# Patient Record
Sex: Male | Born: 1960 | Race: White | Hispanic: No | Marital: Married | Smoking: Former smoker
Health system: Southern US, Community
[De-identification: ages and names within clinical notes are randomized; demographics above are authoritative.]

## PROBLEM LIST (undated history)

## (undated) DIAGNOSIS — E785 Hyperlipidemia, unspecified: Secondary | ICD-10-CM

---

## 2018-04-20 ENCOUNTER — Inpatient Hospital Stay (HOSPITAL_BASED_OUTPATIENT_CLINIC_OR_DEPARTMENT_OTHER)
Admission: EM | Admit: 2018-04-20 | Discharge: 2018-04-28 | DRG: 234 | Disposition: A | Discharge status: Home / Self Care | Payer: 59 | Attending: Cardiothoracic Surgery | Admitting: Cardiothoracic Surgery

## 2018-04-20 ENCOUNTER — Emergency Department (HOSPITAL_BASED_OUTPATIENT_CLINIC_OR_DEPARTMENT_OTHER): Payer: 59

## 2018-04-20 ENCOUNTER — Other Ambulatory Visit: Payer: Self-pay

## 2018-04-20 ENCOUNTER — Encounter (HOSPITAL_BASED_OUTPATIENT_CLINIC_OR_DEPARTMENT_OTHER): Payer: Self-pay

## 2018-04-20 DIAGNOSIS — I219 Acute myocardial infarction, unspecified: Secondary | ICD-10-CM | POA: Diagnosis not present

## 2018-04-20 DIAGNOSIS — Z716 Tobacco abuse counseling: Secondary | ICD-10-CM

## 2018-04-20 DIAGNOSIS — Z79899 Other long term (current) drug therapy: Secondary | ICD-10-CM

## 2018-04-20 DIAGNOSIS — I2510 Atherosclerotic heart disease of native coronary artery without angina pectoris: Secondary | ICD-10-CM | POA: Diagnosis not present

## 2018-04-20 DIAGNOSIS — I361 Nonrheumatic tricuspid (valve) insufficiency: Secondary | ICD-10-CM | POA: Diagnosis not present

## 2018-04-20 DIAGNOSIS — J9811 Atelectasis: Secondary | ICD-10-CM | POA: Diagnosis not present

## 2018-04-20 DIAGNOSIS — Z6829 Body mass index (BMI) 29.0-29.9, adult: Secondary | ICD-10-CM

## 2018-04-20 DIAGNOSIS — I083 Combined rheumatic disorders of mitral, aortic and tricuspid valves: Secondary | ICD-10-CM | POA: Diagnosis not present

## 2018-04-20 DIAGNOSIS — E669 Obesity, unspecified: Secondary | ICD-10-CM | POA: Diagnosis present

## 2018-04-20 DIAGNOSIS — E8770 Fluid overload, unspecified: Secondary | ICD-10-CM | POA: Diagnosis not present

## 2018-04-20 DIAGNOSIS — D62 Acute posthemorrhagic anemia: Secondary | ICD-10-CM | POA: Diagnosis not present

## 2018-04-20 DIAGNOSIS — R7303 Prediabetes: Secondary | ICD-10-CM | POA: Diagnosis present

## 2018-04-20 DIAGNOSIS — I252 Old myocardial infarction: Secondary | ICD-10-CM | POA: Diagnosis not present

## 2018-04-20 DIAGNOSIS — F17210 Nicotine dependence, cigarettes, uncomplicated: Secondary | ICD-10-CM | POA: Diagnosis present

## 2018-04-20 DIAGNOSIS — J986 Disorders of diaphragm: Secondary | ICD-10-CM | POA: Diagnosis not present

## 2018-04-20 DIAGNOSIS — F419 Anxiety disorder, unspecified: Secondary | ICD-10-CM | POA: Diagnosis present

## 2018-04-20 DIAGNOSIS — R072 Precordial pain: Secondary | ICD-10-CM | POA: Diagnosis not present

## 2018-04-20 DIAGNOSIS — Z01810 Encounter for preprocedural cardiovascular examination: Secondary | ICD-10-CM | POA: Diagnosis not present

## 2018-04-20 DIAGNOSIS — J90 Pleural effusion, not elsewhere classified: Secondary | ICD-10-CM | POA: Diagnosis not present

## 2018-04-20 DIAGNOSIS — Z8249 Family history of ischemic heart disease and other diseases of the circulatory system: Secondary | ICD-10-CM | POA: Diagnosis not present

## 2018-04-20 DIAGNOSIS — I081 Rheumatic disorders of both mitral and tricuspid valves: Secondary | ICD-10-CM | POA: Diagnosis present

## 2018-04-20 DIAGNOSIS — I25110 Atherosclerotic heart disease of native coronary artery with unstable angina pectoris: Secondary | ICD-10-CM | POA: Diagnosis not present

## 2018-04-20 DIAGNOSIS — I4891 Unspecified atrial fibrillation: Secondary | ICD-10-CM | POA: Diagnosis not present

## 2018-04-20 DIAGNOSIS — R079 Chest pain, unspecified: Secondary | ICD-10-CM | POA: Diagnosis not present

## 2018-04-20 DIAGNOSIS — I249 Acute ischemic heart disease, unspecified: Secondary | ICD-10-CM | POA: Diagnosis present

## 2018-04-20 DIAGNOSIS — Z09 Encounter for follow-up examination after completed treatment for conditions other than malignant neoplasm: Secondary | ICD-10-CM

## 2018-04-20 DIAGNOSIS — E785 Hyperlipidemia, unspecified: Secondary | ICD-10-CM | POA: Diagnosis present

## 2018-04-20 DIAGNOSIS — I10 Essential (primary) hypertension: Secondary | ICD-10-CM | POA: Diagnosis present

## 2018-04-20 DIAGNOSIS — I214 Non-ST elevation (NSTEMI) myocardial infarction: Principal | ICD-10-CM | POA: Diagnosis present

## 2018-04-20 DIAGNOSIS — Z951 Presence of aortocoronary bypass graft: Secondary | ICD-10-CM

## 2018-04-20 HISTORY — DX: Hyperlipidemia, unspecified: E78.5

## 2018-04-20 LAB — BASIC METABOLIC PANEL
Anion gap: 8 (ref 5–15)
BUN: 15 mg/dL (ref 6–20)
CHLORIDE: 100 mmol/L (ref 98–111)
CO2: 24 mmol/L (ref 22–32)
Calcium: 8.9 mg/dL (ref 8.9–10.3)
Creatinine, Ser: 1.01 mg/dL (ref 0.61–1.24)
GFR calc Af Amer: >60 mL/min (ref >60)
GFR calc non Af Amer: >60 mL/min (ref >60)
Glucose, Bld: 146 mg/dL — ABNORMAL HIGH (ref 70–99)
Potassium: 3.7 mmol/L (ref 3.5–5.1)
Sodium: 132 mmol/L — ABNORMAL LOW (ref 135–145)

## 2018-04-20 LAB — CBC
HCT: 43.1 % (ref 39.0–52.0)
Hemoglobin: 15.1 g/dL (ref 13.0–17.0)
MCH: 30.7 pg (ref 26.0–34.0)
MCHC: 35 g/dL (ref 30.0–36.0)
MCV: 87.6 fL (ref 78.0–100.0)
Platelets: 306 10*3/uL (ref 150–400)
RBC: 4.92 MIL/uL (ref 4.22–5.81)
RDW: 13.3 % (ref 11.5–15.5)
WBC: 14.2 10*3/uL — ABNORMAL HIGH (ref 4.0–10.5)

## 2018-04-20 LAB — TROPONIN I: Troponin I: 1.44 ng/mL (ref <0.03)

## 2018-04-20 MED ORDER — HEPARIN BOLUS VIA INFUSION
4000.0000 [IU] | Freq: Once | INTRAVENOUS | Status: AC
  Administered 2018-04-20: 4000 [IU] via INTRAVENOUS

## 2018-04-20 MED ORDER — HEPARIN (PORCINE) IN NACL 100-0.45 UNIT/ML-% IJ SOLN
12.0000 [IU]/kg/h | Freq: Once | INTRAMUSCULAR | Status: AC
  Administered 2018-04-20: 12 [IU]/kg/h via INTRAVENOUS
  Filled 2018-04-20: qty 250

## 2018-04-20 MED ORDER — ASPIRIN 81 MG PO CHEW
324.0000 mg | CHEWABLE_TABLET | Freq: Once | ORAL | Status: AC
  Administered 2018-04-20: 324 mg via ORAL
  Filled 2018-04-20: qty 4

## 2018-04-20 NOTE — ED Triage Notes (Signed)
Midsternal chest pain radiating to bilateral shoulders while playing golf this afternoon. Denies SOB, nausea. Pt took 2 Goody powder  @ approx. 2030 tonight.

## 2018-04-20 NOTE — ED Notes (Signed)
CRITICAL VALUE ALERT  Critical Value:  Troponin 1.44 Date & Time Notied:  04/20/2018 2218  Provider Notified: Dr. Adela Lank

## 2018-04-20 NOTE — ED Provider Notes (Signed)
[facility] EMERGENCY DEPARTMENT Provider Note   CSN: [id number] Arrival date & time: 04/20/18  2044     History   Chief Complaint Chief Complaint  Patient presents with  . Chest Pain    HPI Zachary [name] is a 57 y.o. male.  87 yoM with a chief complaint chest pain.  The patient played around a golf today and then on his way home he felt a pressure building in his chest that went into his left arm.  Felt that it was pretty severe lasted for he thinks about 10 or 15 minutes.  He denies shortness of breath denies diaphoresis denies nausea or vomiting.  It was severe enough that he went through his wife's medications and took her cholesterol medication.  He also took 2 [employer].  Since then the patient has not had any discomfort though he was concerned that he may have had a heart attack based on the way that it felt.  His younger brother just had a heart attack this past year.  He denies hypertension hyperlipidemia diabetes or smoking.  He denies history of PE or DVT denies recent surgery or hospitalization denies recent prolonged travel denies unilateral leg swelling hemoptysis testosterone use.  The history is provided by the patient.  Chest Pain   This is a new problem. The current episode started 1 to 2 hours ago. The problem occurs rarely. The problem has been resolved. Associated with: driving. The pain is present in the substernal region. The pain is at a severity of 10/10. The pain is severe. The quality of the pain is described as brief and heavy. The pain radiates to the left shoulder and left arm. Duration of episode(s) is 10 minutes. Pertinent negatives include no abdominal pain, no fever, no headaches, no palpitations, no shortness of breath and no vomiting. He has tried rest for the symptoms. The treatment provided significant relief.  Pertinent negatives for past medical history include no DVT, no hyperlipidemia, no hypertension, no MI and no PE.  His family  medical history is significant for early MI.    Past Medical History:  Diagnosis Date  . Hyperlipemia     Patient Active Problem List   Diagnosis Date Noted  . NSTEMI (non-ST elevated myocardial infarction) (HCC) 04/20/2018    History reviewed. No pertinent surgical history.      Home Medications    Prior to Admission medications   Not on File    Family History History reviewed. No pertinent family history.  Social History Social History   Tobacco Use  . Smoking status: Current Every Day Smoker    Types: Cigarettes  . Smokeless tobacco: Never Used  Substance Use Topics  . Alcohol use: Yes  . Drug use: Never     Allergies   Patient has no known allergies.   Review of Systems Review of Systems  Constitutional: Negative for chills and fever.  HENT: Negative for congestion and facial swelling.   Eyes: Negative for discharge and visual disturbance.  Respiratory: Negative for shortness of breath.   Cardiovascular: Positive for chest pain. Negative for palpitations.  Gastrointestinal: Negative for abdominal pain, diarrhea and vomiting.  Musculoskeletal: Negative for arthralgias and myalgias.  Skin: Negative for color change and rash.  Neurological: Negative for tremors, syncope and headaches.  Psychiatric/Behavioral: Negative for confusion and dysphoric mood.     Physical Exam Updated Vital Signs BP (!) 145/87   Pulse 75   Temp 98.8 F (37.1 C) (Oral)   Resp  10   Ht 5\' 10"  (1.778 m)   Wt 90.7 kg (200 lb)   SpO2 99%   BMI 28.70 kg/m   Physical Exam  Constitutional: He is oriented to person, place, and time. He appears well-developed and well-nourished.  HENT:  Head: Normocephalic and atraumatic.  Eyes: Pupils are equal, round, and reactive to light. EOM are normal.  Neck: Normal range of motion. Neck supple. No JVD present.  Cardiovascular: Normal rate and regular rhythm. Exam reveals no gallop and no friction rub.  No murmur  heard. Pulmonary/Chest: No respiratory distress. He has no wheezes.  Abdominal: He exhibits no distension and no mass. There is no tenderness. There is no rebound and no guarding.  Musculoskeletal: Normal range of motion.  Neurological: He is alert and oriented to person, place, and time.  Skin: No rash noted. No pallor.  Psychiatric: He has a normal mood and affect. His behavior is normal.  Nursing note and vitals reviewed.    ED Treatments / Results  Labs (all labs ordered are listed, but only abnormal results are displayed) Labs Reviewed  BASIC METABOLIC PANEL - Abnormal; Notable for the following components:      Result Value   Sodium 132 (*)    Glucose, Bld 146 (*)    All other components within normal limits  CBC - Abnormal; Notable for the following components:   WBC 14.2 (*)    All other components within normal limits  TROPONIN I - Abnormal; Notable for the following components:   Troponin I 1.44 (*)    All other components within normal limits    EKG EKG Interpretation  Date/Time:  Wednesday April 20 2018 20:48:32 EDT Ventricular Rate:  99 PR Interval:  150 QRS Duration: 74 QT Interval:  346 QTC Calculation: 444 R Axis:   67 Text Interpretation:  Normal sinus rhythm Nonspecific ST abnormality Abnormal ECG No old tracing to compare Confirmed by Melene Plan [phone]) on 04/20/2018 10:12:03 PM   Radiology Dg Chest 2 View  Result Date: 04/20/2018 CLINICAL DATA:  Midsternal chest pain with back and left shoulder pain while playing golf today. EXAM: CHEST - 2 VIEW COMPARISON:  None. FINDINGS: The heart size and mediastinal contours are within normal limits. Minimal aortic atherosclerosis. No aneurysm. Both lungs are clear. Slight accentuation of thoracic curvature secondary to multilevel mild disc space narrowing and endplate spurring. Osteoarthritis of the [facility] joints right greater than left with spurring and joint space narrowing. No acute osseous abnormality. IMPRESSION:  1. No active cardiopulmonary disease. 2. Minimal aortic atherosclerosis. 3. Mild thoracic spondylosis. 4. 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 Electronically Signed   By: Tollie Eth M.D.   On: 04/20/2018 21:26    Procedures Procedures (including critical care time)  Medications Ordered in ED Medications  aspirin chewable tablet 324 mg (324 mg Oral Given 04/20/18 2245)  heparin bolus via infusion 4,000 Units (4,000 Units Intravenous Bolus from Bag 04/20/18 2248)  heparin ADULT infusion 100 units/mL (25000 units/245mL sodium chloride 0.45%) (12 Units/kg/hr  90.7 kg Intravenous New Bag/Given 04/20/18 2249)     Initial Impression / Assessment and Plan / ED Course  I have reviewed the triage vital signs and the nursing notes.  Pertinent labs & imaging results that were available during my care of the patient were reviewed by me and considered in my medical decision making (see chart for details).     57 yo M with a chief complaint of chest pain.  Patient  pain was somewhat atypical though shorter lasting then I would normally appreciate however the patient has a troponin of 1.44.  EKG with no concerning ST elevation.  I discussed case with Dr. Algie Coffer, will admit the patient to [facility].  Started on a heparin drip.  Given aspirin.  CRITICAL CARE Performed by: Rae Roam   Total critical care time: 35 minutes  Critical care time was exclusive of separately billable procedures and treating other patients.  Critical care was necessary to treat or prevent imminent or life-threatening deterioration.  Critical care was time spent personally by me on the following activities: development of treatment plan with patient and/or surrogate as well as nursing, discussions with consultants, evaluation of patient's response to treatment, examination of patient, obtaining history from patient or surrogate, ordering and performing treatments and interventions, ordering and review  of laboratory studies, ordering and review of radiographic studies, pulse oximetry and re-evaluation of patient's condition.  The patients results and plan were reviewed and discussed.   Any x-rays performed were independently reviewed by myself.   Differential diagnosis were considered with the presenting HPI.  Medications  aspirin chewable tablet 324 mg (324 mg Oral Given 04/20/18 2245)  heparin bolus via infusion 4,000 Units (4,000 Units Intravenous Bolus from Bag 04/20/18 2248)  heparin ADULT infusion 100 units/mL (25000 units/253mL sodium chloride 0.45%) (12 Units/kg/hr  90.7 kg Intravenous New Bag/Given 04/20/18 2249)    Vitals:   04/20/18 2049 04/20/18 2130 04/20/18 2230 04/20/18 2300  BP:  (!) 144/83 (!) 161/77 (!) 145/87  Pulse:  78 78 75  Resp:  (!) 9 15 10   Temp:      TempSrc:      SpO2:  100% 100% 99%  Weight: 90.7 kg (200 lb)     Height: 5\' 10"  (1.778 m)       Final diagnoses:  NSTEMI (non-ST elevated myocardial infarction) (HCC)    Admission/ observation were discussed with the admitting physician, patient and/or family and they are comfortable with the plan.    Final Clinical Impressions(s) / ED Diagnoses   Final diagnoses:  NSTEMI (non-ST elevated myocardial infarction) [facility])    ED Discharge Orders    None       Melene Plan, DO 04/20/18 2315

## 2018-04-21 ENCOUNTER — Inpatient Hospital Stay (HOSPITAL_COMMUNITY): Payer: 59

## 2018-04-21 ENCOUNTER — Encounter (HOSPITAL_COMMUNITY): Payer: Self-pay | Admitting: Physician Assistant

## 2018-04-21 ENCOUNTER — Encounter (HOSPITAL_COMMUNITY)
Admission: EM | Disposition: A | Discharge status: Home / Self Care | Payer: Self-pay | Source: Home / Self Care | Attending: Cardiothoracic Surgery

## 2018-04-21 DIAGNOSIS — I2510 Atherosclerotic heart disease of native coronary artery without angina pectoris: Secondary | ICD-10-CM

## 2018-04-21 DIAGNOSIS — I249 Acute ischemic heart disease, unspecified: Secondary | ICD-10-CM | POA: Diagnosis present

## 2018-04-21 DIAGNOSIS — I214 Non-ST elevation (NSTEMI) myocardial infarction: Principal | ICD-10-CM

## 2018-04-21 HISTORY — PX: LEFT HEART CATH AND CORONARY ANGIOGRAPHY: CATH118249

## 2018-04-21 LAB — ECHOCARDIOGRAM COMPLETE
HEIGHTINCHES: 69.5 in
Weight: 3259.21 oz

## 2018-04-21 LAB — BASIC METABOLIC PANEL
Anion gap: 9 (ref 5–15)
BUN: 9 mg/dL (ref 6–20)
CO2: 27 mmol/L (ref 22–32)
Calcium: 9.1 mg/dL (ref 8.9–10.3)
Chloride: 104 mmol/L (ref 98–111)
Creatinine, Ser: 0.9 mg/dL (ref 0.61–1.24)
GFR calc Af Amer: >60 mL/min (ref >60)
Glucose, Bld: 117 mg/dL — ABNORMAL HIGH (ref 70–99)
Potassium: 4.7 mmol/L (ref 3.5–5.1)
SODIUM: 140 mmol/L (ref 135–145)

## 2018-04-21 LAB — LIPID PANEL
CHOL/HDL RATIO: 4.2 ratio
Cholesterol: 187 mg/dL (ref 0–200)
HDL: 45 mg/dL (ref >40)
LDL CALC: 125 mg/dL — AB (ref 0–99)
Triglycerides: 86 mg/dL (ref <150)
VLDL: 17 mg/dL (ref 0–40)

## 2018-04-21 LAB — HEPARIN LEVEL (UNFRACTIONATED): Heparin Unfractionated: 0.44 IU/mL (ref 0.30–0.70)

## 2018-04-21 LAB — CBC
HCT: 44.4 % (ref 39.0–52.0)
Hemoglobin: 14.7 g/dL (ref 13.0–17.0)
MCH: 29.7 pg (ref 26.0–34.0)
MCHC: 33.1 g/dL (ref 30.0–36.0)
MCV: 89.7 fL (ref 78.0–100.0)
PLATELETS: 304 10*3/uL (ref 150–400)
RBC: 4.95 MIL/uL (ref 4.22–5.81)
RDW: 13.1 % (ref 11.5–15.5)
WBC: 9.5 10*3/uL (ref 4.0–10.5)

## 2018-04-21 LAB — PROTIME-INR
INR: 1
PROTHROMBIN TIME: 13.1 s (ref 11.4–15.2)

## 2018-04-21 LAB — TROPONIN I
TROPONIN I: 1.44 ng/mL — AB (ref <0.03)
Troponin I: 1.46 ng/mL (ref <0.03)
Troponin I: 1.02 ng/mL (ref <0.03)

## 2018-04-21 LAB — HIV ANTIBODY (ROUTINE TESTING W REFLEX): HIV SCREEN 4TH GENERATION: NONREACTIVE

## 2018-04-21 LAB — MRSA PCR SCREENING: MRSA BY PCR: NEGATIVE

## 2018-04-21 SURGERY — LEFT HEART CATH AND CORONARY ANGIOGRAPHY
Anesthesia: LOCAL

## 2018-04-21 MED ORDER — HEPARIN (PORCINE) IN NACL 1000-0.9 UT/500ML-% IV SOLN
INTRAVENOUS | Status: AC
  Filled 2018-04-21: qty 1000

## 2018-04-21 MED ORDER — MORPHINE SULFATE (PF) 10 MG/ML IV SOLN: 2.0000 mg | INTRAVENOUS | Status: DC | PRN

## 2018-04-21 MED ORDER — SODIUM CHLORIDE 0.9% FLUSH: 3.0000 mL | INTRAVENOUS | Status: DC | PRN

## 2018-04-21 MED ORDER — ASPIRIN EC 81 MG PO TBEC
81.0000 mg | DELAYED_RELEASE_TABLET | Freq: Every day | ORAL | Status: DC
  Administered 2018-04-22 – 2018-04-23 (×2): 81 mg via ORAL
  Filled 2018-04-21 (×2): qty 1

## 2018-04-21 MED ORDER — VERAPAMIL HCL 2.5 MG/ML IV SOLN
INTRAVENOUS | Status: AC
  Filled 2018-04-21: qty 2

## 2018-04-21 MED ORDER — HEPARIN (PORCINE) IN NACL 100-0.45 UNIT/ML-% IJ SOLN
1400.0000 [IU]/h | INTRAMUSCULAR | Status: DC
  Administered 2018-04-22: 1200 [IU]/h via INTRAVENOUS
  Filled 2018-04-21 (×2): qty 250

## 2018-04-21 MED ORDER — ACETAMINOPHEN 325 MG PO TABS: 650.0000 mg | ORAL_TABLET | ORAL | Status: DC | PRN

## 2018-04-21 MED ORDER — HEPARIN (PORCINE) IN NACL 2-0.9 UNITS/ML
INTRAMUSCULAR | Status: AC | PRN
  Administered 2018-04-21 (×2): 500 mL via INTRA_ARTERIAL

## 2018-04-21 MED ORDER — MIDAZOLAM HCL 2 MG/2ML IJ SOLN
INTRAMUSCULAR | Status: DC | PRN
  Administered 2018-04-21: 1 mg via INTRAVENOUS

## 2018-04-21 MED ORDER — ASPIRIN 81 MG PO CHEW: 324.0000 mg | CHEWABLE_TABLET | ORAL | Status: DC

## 2018-04-21 MED ORDER — ONDANSETRON HCL 4 MG/2ML IJ SOLN: 4.0000 mg | Freq: Four times a day (QID) | INTRAMUSCULAR | Status: DC | PRN

## 2018-04-21 MED ORDER — NITROGLYCERIN 0.4 MG SL SUBL
0.4000 mg | SUBLINGUAL_TABLET | SUBLINGUAL | Status: DC | PRN
  Administered 2018-04-22: 0.4 mg via SUBLINGUAL
  Filled 2018-04-21: qty 1

## 2018-04-21 MED ORDER — TIROFIBAN HCL IV 12.5 MG/250 ML
INTRAVENOUS | Status: AC | PRN
  Administered 2018-04-21: 0.15 ug/kg/min via INTRAVENOUS

## 2018-04-21 MED ORDER — VERAPAMIL HCL 2.5 MG/ML IV SOLN
INTRAVENOUS | Status: DC | PRN
  Administered 2018-04-21: 17:00:00 via INTRA_ARTERIAL

## 2018-04-21 MED ORDER — ATORVASTATIN CALCIUM 80 MG PO TABS
80.0000 mg | ORAL_TABLET | Freq: Every day | ORAL | Status: DC
  Administered 2018-04-22 – 2018-04-27 (×5): 80 mg via ORAL
  Filled 2018-04-21 (×5): qty 1

## 2018-04-21 MED ORDER — ACETAMINOPHEN 325 MG PO TABS
650.0000 mg | ORAL_TABLET | ORAL | Status: DC | PRN
  Administered 2018-04-23 (×2): 650 mg via ORAL
  Filled 2018-04-21 (×2): qty 2

## 2018-04-21 MED ORDER — HEPARIN BOLUS VIA INFUSION
4000.0000 [IU] | Freq: Once | INTRAVENOUS | Status: DC
  Filled 2018-04-21: qty 4000

## 2018-04-21 MED ORDER — HEPARIN SODIUM (PORCINE) 1000 UNIT/ML IJ SOLN
INTRAMUSCULAR | Status: DC | PRN
  Administered 2018-04-21: 4000 [IU] via INTRAVENOUS

## 2018-04-21 MED ORDER — HEPARIN SODIUM (PORCINE) 1000 UNIT/ML IJ SOLN
INTRAMUSCULAR | Status: AC
  Filled 2018-04-21: qty 1

## 2018-04-21 MED ORDER — ASPIRIN 300 MG RE SUPP: 300.0000 mg | RECTAL | Status: AC

## 2018-04-21 MED ORDER — HEPARIN (PORCINE) IN NACL 100-0.45 UNIT/ML-% IJ SOLN
1200.0000 [IU]/h | INTRAMUSCULAR | Status: DC
  Filled 2018-04-21: qty 250

## 2018-04-21 MED ORDER — LIDOCAINE HCL (PF) 1 % IJ SOLN
INTRAMUSCULAR | Status: DC | PRN
  Administered 2018-04-21: 2 mL

## 2018-04-21 MED ORDER — FENTANYL CITRATE (PF) 100 MCG/2ML IJ SOLN
INTRAMUSCULAR | Status: AC
  Filled 2018-04-21: qty 2

## 2018-04-21 MED ORDER — TIROFIBAN (AGGRASTAT) BOLUS VIA INFUSION
INTRAVENOUS | Status: DC | PRN
  Administered 2018-04-21: 2310 ug via INTRAVENOUS

## 2018-04-21 MED ORDER — HEPARIN (PORCINE) IN NACL 100-0.45 UNIT/ML-% IJ SOLN
1300.0000 [IU]/h | INTRAMUSCULAR | Status: DC
  Administered 2018-04-21 (×2): 1300 [IU]/h via INTRAVENOUS
  Filled 2018-04-21: qty 250

## 2018-04-21 MED ORDER — NITROGLYCERIN 1 MG/10 ML FOR IR/CATH LAB
INTRA_ARTERIAL | Status: AC
  Filled 2018-04-21: qty 10

## 2018-04-21 MED ORDER — METOPROLOL TARTRATE 12.5 MG HALF TABLET
12.5000 mg | ORAL_TABLET | Freq: Two times a day (BID) | ORAL | Status: DC
  Administered 2018-04-21 – 2018-04-23 (×5): 12.5 mg via ORAL
  Filled 2018-04-21 (×5): qty 1

## 2018-04-21 MED ORDER — MORPHINE SULFATE (PF) 2 MG/ML IV SOLN
2.0000 mg | INTRAVENOUS | Status: DC | PRN
  Administered 2018-04-22 – 2018-04-23 (×2): 2 mg via INTRAVENOUS
  Filled 2018-04-21 (×2): qty 1

## 2018-04-21 MED ORDER — SODIUM CHLORIDE 0.9 % IV SOLN: INTRAVENOUS | Status: AC

## 2018-04-21 MED ORDER — FENTANYL CITRATE (PF) 100 MCG/2ML IJ SOLN
INTRAMUSCULAR | Status: DC | PRN
  Administered 2018-04-21: 25 ug via INTRAVENOUS

## 2018-04-21 MED ORDER — MIDAZOLAM HCL 2 MG/2ML IJ SOLN
INTRAMUSCULAR | Status: AC
  Filled 2018-04-21: qty 2

## 2018-04-21 MED ORDER — ASPIRIN 81 MG PO CHEW: 81.0000 mg | CHEWABLE_TABLET | Freq: Every day | ORAL | Status: DC

## 2018-04-21 MED ORDER — IOHEXOL 350 MG/ML SOLN
INTRAVENOUS | Status: DC | PRN
  Administered 2018-04-21: 85 mL via INTRA_ARTERIAL

## 2018-04-21 MED ORDER — ALPRAZOLAM 0.25 MG PO TABS
0.2500 mg | ORAL_TABLET | Freq: Two times a day (BID) | ORAL | Status: DC
  Administered 2018-04-21 – 2018-04-23 (×5): 0.25 mg via ORAL
  Filled 2018-04-21 (×5): qty 1

## 2018-04-21 MED ORDER — TIROFIBAN HCL IV 12.5 MG/250 ML
INTRAVENOUS | Status: AC
  Filled 2018-04-21: qty 250

## 2018-04-21 MED ORDER — LIDOCAINE HCL (PF) 1 % IJ SOLN
INTRAMUSCULAR | Status: AC
  Filled 2018-04-21: qty 30

## 2018-04-21 MED ORDER — SODIUM CHLORIDE 0.9% FLUSH
3.0000 mL | Freq: Two times a day (BID) | INTRAVENOUS | Status: DC
  Administered 2018-04-21 – 2018-04-22 (×2): 3 mL via INTRAVENOUS

## 2018-04-21 MED ORDER — TIROFIBAN HCL IN NACL 5-0.9 MG/100ML-% IV SOLN
0.1500 ug/kg/min | INTRAVENOUS | Status: AC
  Administered 2018-04-22: 0.15 ug/kg/min via INTRAVENOUS
  Filled 2018-04-21 (×3): qty 100

## 2018-04-21 MED ORDER — SODIUM CHLORIDE 0.9 % IV SOLN: 250.0000 mL | INTRAVENOUS | Status: DC | PRN

## 2018-04-21 SURGICAL SUPPLY — 12 items

## 2018-04-21 NOTE — Progress Notes (Signed)
  Echocardiogram 2D Echocardiogram has been performed.  Bernisha Verma T Maahi Lannan 04/21/2018, 1:27 PM

## 2018-04-21 NOTE — Progress Notes (Addendum)
ANTICOAGULATION CONSULT NOTE - Follow Up Consult  Pharmacy Consult for heparin Indication: NSTEMI  No Known Allergies  Patient Measurements: Height: 5' 9.5" (176.5 cm) Weight: 203 lb 11.2 oz (92.4 kg) IBW/kg (Calculated) : 71.85 Heparin Dosing Weight: 90kg  Vital Signs: Temp: 98.8 F (37.1 C) (06/27 0115) Temp Source: Oral (06/27 0115) BP: 128/79 (06/27 0115) Pulse Rate: 67 (06/27 0115)  Labs: Recent Labs    04/20/18 2130  HGB 15.1  HCT 43.1  PLT 306  CREATININE 1.01  TROPONINI 1.44*    Estimated Creatinine Clearance: 89.2 mL/min (by C-G formula based on SCr of 1.01 mg/dL).   Medications:   Scheduled:  . ALPRAZolam  0.25 mg Oral BID  . aspirin  324 mg Oral NOW   Or  . aspirin  300 mg Rectal NOW  . [START ON 04/22/2018] aspirin EC  81 mg Oral Daily  . atorvastatin  80 mg Oral q1800  . metoprolol tartrate  12.5 mg Oral BID    Assessment: 57yo male c/o chest pressure over 10-50min that radiated to LUE, troponin found to be elevated, to begin heparin.  Goal of Therapy:  Heparin level 0.3-0.7 units/ml Monitor platelets by anticoagulation protocol: Yes   Plan:  Received heparin 4000 units IV bolus x1 followed by gtt at 1100 units/hr at [facility]; will increase gtt to 1300 units/hr and monitor heparin levels and CBC.  Vernard Gambles, PharmD, BCPS  04/21/2018,2:07 AM

## 2018-04-21 NOTE — Progress Notes (Signed)
Pt arrived to floor via stretcher accompanied by carelink. Pt voided upon coming to room. Pt oriented x4. No complaints. Will continue to monitor the pt.

## 2018-04-21 NOTE — Progress Notes (Addendum)
ANTICOAGULATION CONSULT NOTE   Pharmacy Consult for heparin Indication: NSTEMI No Known Allergies  Patient Measurements: Height: 5' 9.5" (176.5 cm) Weight: 203 lb 11.2 oz (92.4 kg) IBW/kg (Calculated) : 71.85 Heparin Dosing Weight: 90Kg  Vital Signs: BP: 150/91 (06/27 1730) Pulse Rate: 60 (06/27 1730)  Labs: Recent Labs    04/20/18 2130 04/21/18 0235 04/21/18 0755 04/21/18 1438  HGB 15.1  --  14.7  --   HCT 43.1  --  44.4  --   PLT 306  --  304  --   LABPROT  --   --  13.1  --   INR  --   --  1.00  --   HEPARINUNFRC  --   --  0.44  --   CREATININE 1.01  --  0.90  --   TROPONINI 1.44* 1.44* 1.46* 1.02*   Estimated Creatinine Clearance: 100.1 mL/min (by C-G formula based on SCr of 0.9 mg/dL).  Medical History: Past Medical History:  Diagnosis Date  . Hyperlipemia    Medications:  Medications Prior to Admission  Medication Sig Dispense Refill Last Dose  . Aspirin-Acetaminophen-Caffeine (GOODYS EXTRA STRENGTH) 500-325-65 MG PACK Take 1 Package by mouth as needed (pain).   04/20/2018 at 2000  . naproxen sodium (ALEVE) 220 MG tablet Take 440 mg by mouth as needed (pain).   04/17/2018  . valACYclovir (VALTREX) 500 MG tablet Take 500 mg by mouth 2 (two) times daily.   04/12/2018   Assessment: 57yo male c/o chest pressure on heparin while awaiting cardiac cath.  Cath today finding 3 vessel disease with preserved LV function - follow up for possible CABG in future. Hgb 14.7, plt 304. No s/sx of bleeding. Sheath was removed at 1735 and tirofiban infusion planned to run for 18 hours post-cath. Plan to resume heparin infusion after completion of tirofiban infusion per conversation with Dr Allyson Sabal.  Goal of Therapy:  Heparin level 0.3-0.7 units/ml  Monitor platelets by anticoagulation protocol: Yes   Plan:  Restart heparin gtt at 1200 units/hr starting on 6/28 at 1200 (after completion of tirofiban infusion) Daily heparin level/CBC  Monitor for s/sx of bleeding F/U plans for  possible CABG evaluation  Girard Cooter, PharmD Clinical Pharmacist  Pager: [phone] Phone: [phone] 04/21/2018 5:56 PM

## 2018-04-21 NOTE — Interval H&P Note (Signed)
Cath Lab Visit (complete for each Cath Lab visit)  Clinical Evaluation Leading to the Procedure:   ACS: Yes.    Non-ACS:    Anginal Classification: CCS III  Anti-ischemic medical therapy: No Therapy  Non-Invasive Test Results: No non-invasive testing performed  Prior CABG: No previous CABG      History and Physical Interval Note:  04/21/2018 4:46 PM  Zachary [name]  has presented today for surgery, with the diagnosis of cp  The various methods of treatment have been discussed with the patient and family. After consideration of risks, benefits and other options for treatment, the patient has consented to  Procedure(s): LEFT HEART CATH AND CORONARY ANGIOGRAPHY (N/A) as a surgical intervention .  The patient's history has been reviewed, patient examined, no change in status, stable for surgery.  I have reviewed the patient's chart and labs.  Questions were answered to the patient's satisfaction.     Nanetta Batty

## 2018-04-21 NOTE — H&P (View-Only) (Signed)
Cardiology Consultation:   Patient ID: Zachary [name]; [id number]; 08/06/1961   Admit date: 04/20/2018 Date of Consult: 04/21/2018  Primary Care Provider: Patient, No Pcp Per Primary Cardiologist: New to Dr. Crenshaw   Patient Profile:   Zachary [name] is a 57 y.o. male with a hx of diet-controlled hyperlipidemia and tobacco smoking who is being seen today for the evaluation of non-STEMI at the request of patient and wife.  History of Present Illness:   Zachary [name] was admitted by Dr. Kadakia early this morning for non-STEMI.  Patient prefers cardiologist by [facility].  Patient had discussion with PCP who recommended  [facility].  Zachary [name] presented last night with acute onset chest pain.  Zachary [name] said had normal day yesterday and played 18 holes of golf in the evening.  After dinner Zachary [name] started to having 7 out of 10 substernal chest pressure radiating to bilateral shoulder.  No associated shortness of breath, nausea, vomiting or diaphoresis.  Zachary [name] took 2 bag of Goody powder with minimal improvement.  Zachary [name] pain persisted >> took wife's statin and ramipril >>Zachary [name] went to [facility] for further evaluation.  Zachary [name] was found to be hypertensive and elevated troponin at 1.4.  Zachary [name] pain subsided eventually in ER.  No recurrence.  Treated with IV heparin, aspirin, beta-blocker and statin.  Younger brother had stenting at age 57.  Zachary [name] smokes half pack a day for past 30 years.  Denies illicit drug use.  No orthopnea, PND, syncope, lower extremity edema, dizziness or palpitation.  Past Medical History:  Diagnosis Date  . Hyperlipemia     Inpatient Medications: Scheduled Meds: . ALPRAZolam  0.25 mg Oral BID  . aspirin  324 mg Oral NOW   Or  . aspirin  300 mg Rectal NOW  . [START ON 04/22/2018] aspirin EC  81 mg Oral Daily  . atorvastatin  80 mg Oral q1800  . metoprolol tartrate  12.5 mg Oral BID   Continuous Infusions: . heparin 1,300 Units/hr (04/21/18 0229)   PRN Meds: acetaminophen, nitroGLYCERIN,  ondansetron (ZOFRAN) IV  Allergies:   No Known Allergies  Social History:   Social History   Socioeconomic History  . Marital status: Married    Spouse name: Not on file  . Number of children: Not on file  . Years of education: Not on file  . Highest education level: Not on file  Occupational History  . Not on file  Social Needs  . Financial resource strain: Not on file  . Food insecurity:    Worry: Not on file    Inability: Not on file  . Transportation needs:    Medical: Not on file    Non-medical: Not on file  Tobacco Use  . Smoking status: Current Every Day Smoker    Types: Cigarettes  . Smokeless tobacco: Never Used  Substance and Sexual Activity  . Alcohol use: Yes  . Drug use: Never  . Sexual activity: Not on file  Lifestyle  . Physical activity:    Days per week: Not on file    Minutes per session: Not on file  . Stress: Not on file  Relationships  . Social connections:    Talks on phone: Not on file    Gets together: Not on file    Attends religious service: Not on file    Active member of club or organization: Not on file    Attends meetings of clubs or organizations: Not on file    Relationship status: Not on file  .   Intimate partner violence:    Fear of current or ex partner: Not on file    Emotionally abused: Not on file    Physically abused: Not on file    Forced sexual activity: Not on file  Other Topics Concern  . Not on file  Social History Narrative  . Not on file    Family History:    Family History  Problem Relation Age of Onset  . CAD Father   . CAD Brother      ROS:  Please see the history of present illness.  All other ROS reviewed and negative.     Physical Exam/Data:   Vitals:   04/20/18 2351 04/21/18 0100 04/21/18 0115 04/21/18 0428  BP: 138/82  128/79 110/70  Pulse: 76  67 (!) 56  Resp: 17  16 16  Temp:   98.8 F (37.1 C) 98.7 F (37.1 C)  TempSrc:   Oral Oral  SpO2: 97%  95% 98%  Weight:  203 lb 11.2 oz (92.4  kg)  203 lb 11.2 oz (92.4 kg)  Height:  5' 9.5" (1.765 m)      Intake/Output Summary (Last 24 hours) at 04/21/2018 1048 Last data filed at 04/21/2018 0836 Gross per 24 hour  Intake 0 ml  Output -  Net 0 ml   Filed Weights   04/20/18 2049 04/21/18 0100 04/21/18 0428  Weight: 200 lb (90.7 kg) 203 lb 11.2 oz (92.4 kg) 203 lb 11.2 oz (92.4 kg)   Body mass index is 29.65 kg/m.  General:  Well nourished, well developed, in no acute distress HEENT: normal Lymph: no adenopathy Neck: no JVD Endocrine:  No thryomegaly Vascular: No carotid bruits; FA pulses 2+ bilaterally without bruits  Cardiac:  normal S1, S2; RRR; no murmur  Lungs:  clear to auscultation bilaterally, no wheezing, rhonchi or rales  Abd: soft, nontender, no hepatomegaly  Ext: no edema Musculoskeletal:  No deformities, BUE and BLE strength normal and equal Skin: warm and dry  Neuro:  CNs 2-12 intact, no focal abnormalities noted Psych:  Normal affect   EKG:  The EKG was personally reviewed and demonstrates: Sinus rhythm with nonspecific T wave inversion in inferior leads Telemetry:  Telemetry was personally reviewed and demonstrates: Sinus rhythm  Relevant CV Studies: Pending echocardiogram and cardiac cath  Laboratory Data:  Chemistry Recent Labs  Lab 04/20/18 2130 04/21/18 0755  NA 132* 140  K 3.7 4.7  CL 100 104  CO2 24 27  GLUCOSE 146* 117*  BUN 15 9  CREATININE 1.01 0.90  CALCIUM 8.9 9.1  GFRNONAA >60 >60  GFRAA >60 >60  ANIONGAP 8 9    Hematology Recent Labs  Lab 04/20/18 2130 04/21/18 0755  WBC 14.2* 9.5  RBC 4.92 4.95  HGB 15.1 14.7  HCT 43.1 44.4  MCV 87.6 89.7  MCH 30.7 29.7  MCHC 35.0 33.1  RDW 13.3 13.1  PLT 306 304   Cardiac Enzymes Recent Labs  Lab 04/20/18 2130 04/21/18 0235 04/21/18 0755  TROPONINI 1.44* 1.44* 1.46*    Radiology/Studies:  Dg Chest 2 View  Result Date: 04/20/2018 CLINICAL DATA:  Midsternal chest pain with back and left shoulder pain while playing  golf today. EXAM: CHEST - 2 VIEW COMPARISON:  None. FINDINGS: The heart size and mediastinal contours are within normal limits. Minimal aortic atherosclerosis. No aneurysm. Both lungs are clear. Slight accentuation of thoracic curvature secondary to multilevel mild disc space narrowing and endplate spurring. Osteoarthritis of the AC joints right greater than   left with spurring and joint space narrowing. No acute osseous abnormality. IMPRESSION: 1. No active cardiopulmonary disease. 2. Minimal aortic atherosclerosis. 3. Mild thoracic spondylosis. 4. 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 Electronically Signed   By: David  Kwon M.D.   On: 04/20/2018 21:26    Assessment and Plan:   1. Non-STEMI Chest pain-free since ER presentation.  EKG with T wave inversion in lead III>> improved this morning.  Troponin elevated flat trend 1.44>>1.44>>1.46.  Zachary [name] cardiac risk factor includes hyperlipidemia, ongoing tobacco smoking and family history of CAD. -Continue IV heparin, aspirin, statin and beta-blocker. - The patient understands that risks include but are not limited to stroke (1 in 1000), death (1 in 1000), kidney failure [usually temporary] (1 in 500), bleeding (1 in 200), allergic reaction [possibly serious] (1 in 200), and agrees to proceed.   2.Tobacco smoking  - Says I "Quit" yesterday.   3. HLD - 04/21/2018: Cholesterol 187; HDL 45; LDL Cholesterol 125; Triglycerides 86; VLDL 17  - Continue statin. Repeat labs in 6 weeks.    For questions or updates, please contact [facility] Please consult [email or web address] for contact info under Cardiology/STEMI.   Signed, Bhavinkumar Bhagat, PA  04/21/2018 10:48 AM   As above, patient seen and examined.  Briefly Zachary [name] is a 57-year-old male with past medical history of diet-controlled hyperlipidemia with non-ST elevation myocardial infarction.  Patient was playing golf yesterday and developed chest pressure radiating to Zachary [name] shoulders  bilaterally.  No associated nausea, dyspnea or diaphoresis.  The pain was not pleuritic or positional.  Resolved spontaneously after 15 minutes.  Zachary [name] was admitted and Zachary [name] troponin is abnormal.  Cardiology asked to evaluate.  Electrocardiogram showed sinus rhythm with subtle inferior ST changes.  1 non-ST elevation myocardial infarction-patient presents with chest pain and elevated troponin.  Plan cardiac catheterization.  The risks and benefits including myocardial infarction, CVA and death were discussed and Zachary [name] agrees to proceed.  We will treat with aspirin, heparin, statin and metoprolol.  Note Zachary [name] has traveled recently.  If catheterization unremarkable would likely need to exclude pulmonary embolus with either CTA or VQ scan.  2 Tobacco abuse-patient counseled on discontinuing.    3 hyperlipidemia-Continue Lipitor 80 mg daily.  Check lipids and liver in 4 weeks.  Brian Crenshaw, MD  

## 2018-04-21 NOTE — Consult Note (Addendum)
Cardiology Consultation:   Patient ID: Zachary [name]; [id number]; [birth year]   Admit date: 04/20/2018 Date of Consult: 04/21/2018  Primary Care Provider: Patient, No Pcp Per Primary Cardiologist: New to Dr. Jens Som   Patient Profile:   Zachary [name] is a 57 y.o. male with a hx of diet-controlled hyperlipidemia and tobacco smoking who is being seen today for the evaluation of non-STEMI at the request of patient and wife.  History of Present Illness:   Zachary [name] was admitted by Dr. Algie Coffer early this morning for non-STEMI.  Patient prefers cardiologist by [facility].  Patient had discussion with PCP who recommended  [facility].  He presented last night with acute onset chest pain.  He said had normal day yesterday and played 18 holes of golf in the evening.  After dinner he started to having 7 out of 10 substernal chest pressure radiating to bilateral shoulder.  No associated shortness of breath, nausea, vomiting or diaphoresis.  He took 2 bag of Goody powder with minimal improvement.  His pain persisted >> took wife's statin and ramipril >>he went to [facility] for further evaluation.  He was found to be hypertensive and elevated troponin at 1.4.  His pain subsided eventually in ER.  No recurrence.  Treated with IV heparin, aspirin, beta-blocker and statin.  Younger brother had stenting at age 57.  He smokes half pack a day for past 30 years.  Denies illicit drug use.  No orthopnea, PND, syncope, lower extremity edema, dizziness or palpitation.  Past Medical History:  Diagnosis Date  . Hyperlipemia     Inpatient Medications: Scheduled Meds: . ALPRAZolam  0.25 mg Oral BID  . aspirin  324 mg Oral NOW   Or  . aspirin  300 mg Rectal NOW  . [START ON 04/22/2018] aspirin EC  81 mg Oral Daily  . atorvastatin  80 mg Oral q1800  . metoprolol tartrate  12.5 mg Oral BID   Continuous Infusions: . heparin 1,300 Units/hr (04/21/18 0229)   PRN Meds: acetaminophen, nitroGLYCERIN,  ondansetron (ZOFRAN) IV  Allergies:   No Known Allergies  Social History:   Social History   Socioeconomic History  . Marital status: Married    Spouse name: Not on file  . Number of children: Not on file  . Years of education: Not on file  . Highest education level: Not on file  Occupational History  . Not on file  Social Needs  . Financial resource strain: Not on file  . Food insecurity:    Worry: Not on file    Inability: Not on file  . Transportation needs:    Medical: Not on file    Non-medical: Not on file  Tobacco Use  . Smoking status: Current Every Day Smoker    Types: Cigarettes  . Smokeless tobacco: Never Used  Substance and Sexual Activity  . Alcohol use: Yes  . Drug use: Never  . Sexual activity: Not on file  Lifestyle  . Physical activity:    Days per week: Not on file    Minutes per session: Not on file  . Stress: Not on file  Relationships  . Social connections:    Talks on phone: Not on file    Gets together: Not on file    Attends religious service: Not on file    Active member of club or organization: Not on file    Attends meetings of clubs or organizations: Not on file    Relationship status: Not on file  .  Intimate partner violence:    Fear of current or ex partner: Not on file    Emotionally abused: Not on file    Physically abused: Not on file    Forced sexual activity: Not on file  Other Topics Concern  . Not on file  Social History Narrative  . Not on file    Family History:    Family History  Problem Relation Age of Onset  . CAD Father   . CAD Brother      ROS:  Please see the history of present illness.  All other ROS reviewed and negative.     Physical Exam/Data:   Vitals:   04/20/18 2351 04/21/18 0100 04/21/18 0115 04/21/18 0428  BP: 138/82  128/79 110/70  Pulse: 76  67 (!) 56  Resp: 17  16 16   Temp:   98.8 F (37.1 C) 98.7 F (37.1 C)  TempSrc:   Oral Oral  SpO2: 97%  95% 98%  Weight:  203 lb 11.2 oz (92.4  kg)  203 lb 11.2 oz (92.4 kg)  Height:  5' 9.5" (1.765 m)      Intake/Output Summary (Last 24 hours) at 04/21/2018 1048 Last data filed at 04/21/2018 0836 Gross per 24 hour  Intake 0 ml  Output -  Net 0 ml   Filed Weights   04/20/18 2049 04/21/18 0100 04/21/18 0428  Weight: 200 lb (90.7 kg) 203 lb 11.2 oz (92.4 kg) 203 lb 11.2 oz (92.4 kg)   Body mass index is 29.65 kg/m.  General:  Well nourished, well developed, in no acute distress HEENT: normal Lymph: no adenopathy Neck: no JVD Endocrine:  No thryomegaly Vascular: No carotid bruits; FA pulses 2+ bilaterally without bruits  Cardiac:  normal S1, S2; RRR; no murmur  Lungs:  clear to auscultation bilaterally, no wheezing, rhonchi or rales  Abd: soft, nontender, no hepatomegaly  Ext: no edema Musculoskeletal:  No deformities, BUE and BLE strength normal and equal Skin: warm and dry  Neuro:  CNs 2-12 intact, no focal abnormalities noted Psych:  Normal affect   EKG:  The EKG was personally reviewed and demonstrates: Sinus rhythm with nonspecific T wave inversion in inferior leads Telemetry:  Telemetry was personally reviewed and demonstrates: Sinus rhythm  Relevant CV Studies: Pending echocardiogram and cardiac cath  Laboratory Data:  Chemistry Recent Labs  Lab 04/20/18 2130 04/21/18 0755  NA 132* 140  K 3.7 4.7  CL 100 104  CO2 24 27  GLUCOSE 146* 117*  BUN 15 9  CREATININE 1.01 0.90  CALCIUM 8.9 9.1  GFRNONAA >60 >60  GFRAA >60 >60  ANIONGAP 8 9    Hematology Recent Labs  Lab 04/20/18 2130 04/21/18 0755  WBC 14.2* 9.5  RBC 4.92 4.95  HGB 15.1 14.7  HCT 43.1 44.4  MCV 87.6 89.7  MCH 30.7 29.7  MCHC 35.0 33.1  RDW 13.3 13.1  PLT 306 304   Cardiac Enzymes Recent Labs  Lab 04/20/18 2130 04/21/18 0235 04/21/18 0755  TROPONINI 1.44* 1.44* 1.46*    Radiology/Studies:  Dg Chest 2 View  Result Date: 04/20/2018 CLINICAL DATA:  Midsternal chest pain with back and left shoulder pain while playing  golf today. EXAM: CHEST - 2 VIEW COMPARISON:  None. FINDINGS: The heart size and mediastinal contours are within normal limits. Minimal aortic atherosclerosis. No aneurysm. Both lungs are clear. Slight accentuation of thoracic curvature secondary to multilevel mild disc space narrowing and endplate spurring. Osteoarthritis of the [facility] joints right greater than  left with spurring and joint space narrowing. No acute osseous abnormality. IMPRESSION: 1. No active cardiopulmonary disease. 2. Minimal aortic atherosclerosis. 3. Mild thoracic spondylosis. 4. 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 Electronically Signed   By: Tollie Eth M.D.   On: 04/20/2018 21:26    Assessment and Plan:   1. Non-STEMI Chest pain-free since ER presentation.  EKG with T wave inversion in lead III>> improved this morning.  Troponin elevated flat trend 1.44>>1.44>>1.46.  His cardiac risk factor includes hyperlipidemia, ongoing tobacco smoking and family history of CAD. -Continue IV heparin, aspirin, statin and beta-blocker. - The patient understands that risks include but are not limited to stroke (1 in 1000), death (1 in 1000), kidney failure [usually temporary] (1 in 500), bleeding (1 in 200), allergic reaction [possibly serious] (1 in 200), and agrees to proceed.   2.Tobacco smoking  - Says I "Quit" yesterday.   3. HLD - 04/21/2018: Cholesterol 187; HDL 45; LDL Cholesterol 125; Triglycerides 86; VLDL 17  - Continue statin. Repeat labs in 6 weeks.    For questions or updates, please contact [facility] Please consult [email or web address] for contact info under Cardiology/STEMI.   Lorelei Pont, PA  04/21/2018 10:48 AM   As above, patient seen and examined.  Briefly he is a 57 year old male with past medical history of diet-controlled hyperlipidemia with non-ST elevation myocardial infarction.  Patient was playing golf yesterday and developed chest pressure radiating to his shoulders  bilaterally.  No associated nausea, dyspnea or diaphoresis.  The pain was not pleuritic or positional.  Resolved spontaneously after 15 minutes.  He was admitted and his troponin is abnormal.  Cardiology asked to evaluate.  Electrocardiogram showed sinus rhythm with subtle inferior ST changes.  1 non-ST elevation myocardial infarction-patient presents with chest pain and elevated troponin.  Plan cardiac catheterization.  The risks and benefits including myocardial infarction, CVA and death were discussed and he agrees to proceed.  We will treat with aspirin, heparin, statin and metoprolol.  Note he has traveled recently.  If catheterization unremarkable would likely need to exclude pulmonary embolus with either CTA or VQ scan.  2 Tobacco abuse-patient counseled on discontinuing.    3 hyperlipidemia-Continue Lipitor 80 mg daily.  Check lipids and liver in 4 weeks.  Olga Millers, MD

## 2018-04-21 NOTE — Progress Notes (Signed)
ANTICOAGULATION CONSULT NOTE   Pharmacy Consult for heparin Indication: NSTEMI No Known Allergies  Patient Measurements: Height: 5' 9.5" (176.5 cm) Weight: 203 lb 11.2 oz (92.4 kg) IBW/kg (Calculated) : 71.85 Heparin Dosing Weight: 90Kg  Vital Signs: Temp: 98.7 F (37.1 C) (06/27 0428) Temp Source: Oral (06/27 0428) BP: 110/70 (06/27 0428) Pulse Rate: 56 (06/27 0428)  Labs: Recent Labs    04/20/18 2130 04/21/18 0235 04/21/18 0755  HGB 15.1  --  14.7  HCT 43.1  --  44.4  PLT 306  --  304  LABPROT  --   --  13.1  INR  --   --  1.00  HEPARINUNFRC  --   --  0.44  CREATININE 1.01  --  0.90  TROPONINI 1.44* 1.44* 1.46*   Estimated Creatinine Clearance: 100.1 mL/min (by C-G formula based on SCr of 0.9 mg/dL).  Medical History: Past Medical History:  Diagnosis Date  . Hyperlipemia    Medications:  No medications prior to admission.   Assessment: 57yo male c/o chest pressure on heparin while awaiting cardiac cath. CBC stable  Heparin level therapeutic: 0.44  Goal of Therapy:  Heparin level 0.3-0.7 units/ml Monitor platelets by anticoagulation protocol: Yes   Plan:  Continue heparin gtt at 1300 units/hr Daily heparin level/CBC Monitor for s/sx of bleeding F/U plans for cardiac cath  Ruben Im, PharmD Clinical Pharmacist 04/21/2018 11:03 AM Please check AMION for all [facility] Pharmacy numbers

## 2018-04-21 NOTE — H&P (Signed)
Referring Physician:  [name] is an 57 y.o. male.                       Chief Complaint: Chest pain  HPI: 57 year old male patient had pressure type chest pain after playing golf.  His chest pain lasted for about 10 to 15 minutes and had some radiation to the back.  Patient denied shortness of breath, diaphoresis, nausea or vomiting.  He has a past medical history of hyperlipidemia. He also has a family history of premature coronary artery disease to a younger brother. Currently he is chest pain-free. His EKG showed normal sinus rhythm with nonspecific ST abnormality.   Past Medical History:  Diagnosis Date  . Hyperlipemia       History reviewed. No pertinent surgical history.  History reviewed. No pertinent family history. Social History:  reports that he has been smoking cigarettes.  He has never used smokeless tobacco. He reports that he drinks alcohol. He reports that he does not use drugs.  Allergies: No Known Allergies  No medications prior to admission.    Results for orders placed or performed during the hospital encounter of 04/20/18 (from the past 48 hour(s))  Basic metabolic panel     Status: Abnormal   Collection Time: 04/20/18  9:30 PM  Result Value Ref Range   Sodium 132 (L) 135 - 145 mmol/L   Potassium 3.7 3.5 - 5.1 mmol/L   Chloride 100 98 - 111 mmol/L    Comment: Please note change in reference range.   CO2 24 22 - 32 mmol/L   Glucose, Bld 146 (H) 70 - 99 mg/dL    Comment: Please note change in reference range.   BUN 15 6 - 20 mg/dL    Comment: Please note change in reference range.   Creatinine, Ser 1.01 0.61 - 1.24 mg/dL   Calcium 8.9 8.9 - 10.3 mg/dL   GFR calc non Af Amer >60 >60 mL/min   GFR calc Af Amer >60 >60 mL/min    Comment: (NOTE) The eGFR has been calculated using the CKD EPI equation. This calculation has not been validated in all clinical situations. eGFR's persistently <60 mL/min signify possible Chronic Kidney Disease.    Anion gap  8 5 - 15    Comment: Performed at [facility], [address] [place], [place] [address]  CBC     Status: Abnormal   Collection Time: 04/20/18  9:30 PM  Result Value Ref Range   WBC 14.2 (H) 4.0 - 10.5 K/uL   RBC 4.92 4.22 - 5.81 MIL/uL   Hemoglobin 15.1 13.0 - 17.0 g/dL   HCT 43.1 39.0 - 52.0 %   MCV 87.6 78.0 - 100.0 fL   MCH 30.7 26.0 - 34.0 pg   MCHC 35.0 30.0 - 36.0 g/dL   RDW 13.3 11.5 - 15.5 %   Platelets 306 150 - 400 K/uL    Comment: Performed at [facility], [address] [place], [place] [address]  Troponin I     Status: Abnormal   Collection Time: 04/20/18  9:30 PM  Result Value Ref Range   Troponin I 1.44 (HH) <0.03 ng/mL    Comment: CRITICAL RESULT CALLED TO, READ BACK BY AND VERIFIED WITH: LEBRON,Y,RN @ 2217 04/20/18 BY GWYN,P Performed at [facility], [address] [place], [place] [address]    Dg Chest 2 View  Result Date: 04/20/2018 CLINICAL DATA:  Midsternal chest pain with back and left shoulder pain while playing golf today. EXAM: CHEST - 2 VIEW COMPARISON:  None. FINDINGS: The heart size and mediastinal contours are within normal limits. Minimal aortic atherosclerosis. No aneurysm. Both lungs are clear. Slight accentuation of thoracic curvature secondary to multilevel mild disc space narrowing and endplate spurring. Osteoarthritis of the [facility] joints right greater than left with spurring and joint space narrowing. No acute osseous abnormality. IMPRESSION: 1. No active cardiopulmonary disease. 2. Minimal aortic atherosclerosis. 3. Mild thoracic spondylosis. 4. 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 Electronically Signed   By: Ashley Royalty M.D.   On: 04/20/2018 21:26    Review Of Systems Constitutional: No fever, chills, weight loss or gain. Eyes: No vision change, wears glasses. No discharge or pain. Ears: No hearing loss, No tinnitus. Respiratory: No asthma, COPD, pneumonias. No shortness of breath. No  hemoptysis. Cardiovascular: No chest pain, palpitation, leg edema. Gastrointestinal: No nausea, vomiting, diarrhea, constipation. No GI bleed. No hepatitis. Genitourinary: No dysuria, hematuria, kidney stone. No incontinance. Neurological: No headache, stroke, seizures.  Psychiatry: No psych facility admission for anxiety, depression, suicide. No detox. Skin: No rash. Musculoskeletal: No joint pain, fibromyalgia. No neck pain, back pain. Lymphadenopathy: No lymphadenopathy. Hematology: No anemia or easy bruising.   Blood pressure 128/79, pulse 67, temperature 98.8 F (37.1 C), temperature source Oral, resp. rate 16, height 5' 9.5" (1.765 m), weight 92.4 kg (203 lb 11.2 oz), SpO2 95 %. Body mass index is 29.65 kg/m. General appearance: alert, cooperative, appears stated age and no distress Head: Normocephalic, atraumatic. Eyes: Blue eyes, pink conjunctiva, corneas clear.  Neck: No adenopathy, no carotid bruit, no JVD, supple, symmetrical, trachea midline and thyroid not enlarged. Resp: Clear to auscultation bilaterally. Cardio: Regular rate and rhythm, S1, S2 normal, II/VI systolic murmur, no click, rub or gallop GI: Soft, non-tender; bowel sounds normal; no organomegaly. Extremities: No edema, cyanosis or clubbing. Skin: Warm and dry.  Neurologic: Alert and oriented X 3, normal strength. Normal coordination and gait.  Assessment/Plan Acute coronary syndrome Rule out coronary artery disease Hyperlipidemia Obesity  Discussed cardiac catheterization procedure and the risks. Patient agreeable to procedure with possible coronary angioplasty. Xanax for anxiety.  Birdie Riddle, MD  04/21/2018, 2:02 AM

## 2018-04-21 NOTE — Progress Notes (Signed)
Pt and wife wants to change providers to [facility]. I contacted Dr Algie Coffer regarding this request.

## 2018-04-22 ENCOUNTER — Encounter (HOSPITAL_COMMUNITY): Payer: Self-pay | Admitting: Cardiovascular Disease

## 2018-04-22 ENCOUNTER — Encounter (HOSPITAL_COMMUNITY): Payer: Self-pay | Admitting: Certified Registered Nurse Anesthetist

## 2018-04-22 ENCOUNTER — Inpatient Hospital Stay (HOSPITAL_COMMUNITY): Payer: 59

## 2018-04-22 ENCOUNTER — Other Ambulatory Visit: Payer: Self-pay | Admitting: *Deleted

## 2018-04-22 DIAGNOSIS — I2510 Atherosclerotic heart disease of native coronary artery without angina pectoris: Secondary | ICD-10-CM

## 2018-04-22 LAB — CBC
HEMATOCRIT: 39.5 % (ref 39.0–52.0)
HEMOGLOBIN: 13 g/dL (ref 13.0–17.0)
MCH: 29.8 pg (ref 26.0–34.0)
MCHC: 32.9 g/dL (ref 30.0–36.0)
MCV: 90.6 fL (ref 78.0–100.0)
Platelets: 281 10*3/uL (ref 150–400)
RBC: 4.36 MIL/uL (ref 4.22–5.81)
RDW: 13 % (ref 11.5–15.5)
WBC: 8.2 10*3/uL (ref 4.0–10.5)

## 2018-04-22 LAB — PULMONARY FUNCTION TEST
DL/VA % pred: 99 %
DL/VA: 4.52 ml/min/mmHg/L
DLCO cor % pred: 84 %
DLCO cor: 26.3 ml/min/mmHg
DLCO unc % pred: 80 %
DLCO unc: 25.04 ml/min/mmHg
FEF 25-75 Post: 3.57 L/sec
FEF 25-75 Pre: 3.32 L/sec
FEF2575-%Change-Post: 7 %
FEF2575-%Pred-Post: 122 %
FEF2575-%Pred-Pre: 113 %
FEV1-%Change-Post: 1 %
FEV1-%Pred-Post: 92 %
FEV1-%Pred-Pre: 91 %
FEV1-Post: 3.23 L
FEV1-Pre: 3.2 L
FEV1FVC-%Change-Post: 0 %
FEV1FVC-%Pred-Pre: 107 %
FEV6-%Change-Post: 1 %
FEV6-%Pred-Post: 89 %
FEV6-%Pred-Pre: 88 %
FEV6-Post: 3.96 L
FEV6-Pre: 3.91 L
FEV6FVC-%Change-Post: 0 %
FEV6FVC-%Pred-Post: 105 %
FEV6FVC-%Pred-Pre: 104 %
FVC-%Change-Post: 0 %
FVC-%Pred-Post: 85 %
FVC-%Pred-Pre: 84 %
FVC-Post: 3.96 L
FVC-Pre: 3.92 L
Post FEV1/FVC ratio: 82 %
Post FEV6/FVC ratio: 100 %
Pre FEV1/FVC ratio: 81 %
Pre FEV6/FVC Ratio: 100 %
RV % pred: 84 %
RV: 1.83 L
TLC % pred: 85 %
TLC: 5.79 L

## 2018-04-22 LAB — BASIC METABOLIC PANEL
ANION GAP: 6 (ref 5–15)
BUN: 11 mg/dL (ref 6–20)
CHLORIDE: 108 mmol/L (ref 98–111)
CO2: 26 mmol/L (ref 22–32)
Calcium: 7.9 mg/dL — ABNORMAL LOW (ref 8.9–10.3)
Creatinine, Ser: 0.88 mg/dL (ref 0.61–1.24)
GFR calc Af Amer: >60 mL/min (ref >60)
GFR calc non Af Amer: >60 mL/min (ref >60)
Glucose, Bld: 108 mg/dL — ABNORMAL HIGH (ref 70–99)
POTASSIUM: 3.7 mmol/L (ref 3.5–5.1)
Sodium: 140 mmol/L (ref 135–145)

## 2018-04-22 LAB — HEPARIN LEVEL (UNFRACTIONATED): HEPARIN UNFRACTIONATED: 0.21 [IU]/mL — AB (ref 0.30–0.70)

## 2018-04-22 MED ORDER — ALBUTEROL SULFATE (2.5 MG/3ML) 0.083% IN NEBU
2.5000 mg | INHALATION_SOLUTION | Freq: Once | RESPIRATORY_TRACT | Status: AC
  Administered 2018-04-22: 2.5 mg via RESPIRATORY_TRACT

## 2018-04-22 MED ORDER — NITROGLYCERIN 2 % TD OINT
1.0000 [in_us] | TOPICAL_OINTMENT | Freq: Four times a day (QID) | TRANSDERMAL | Status: DC
  Administered 2018-04-22 (×3): 1 [in_us] via TOPICAL
  Filled 2018-04-22 (×2): qty 30

## 2018-04-22 MED ORDER — NITROGLYCERIN IN D5W 200-5 MCG/ML-% IV SOLN
0.0000 ug/min | INTRAVENOUS | Status: DC
  Administered 2018-04-22: 10 ug/min via INTRAVENOUS
  Filled 2018-04-22: qty 250

## 2018-04-22 MED ORDER — NITROGLYCERIN IN D5W 200-5 MCG/ML-% IV SOLN
INTRAVENOUS | Status: AC
  Filled 2018-04-22: qty 250

## 2018-04-22 MED FILL — Nitroglycerin IV Soln 100 MCG/ML in D5W: INTRA_ARTERIAL | Qty: 10 | Status: AC

## 2018-04-22 MED FILL — Heparin Sod (Porcine)-NaCl IV Soln 1000 Unit/500ML-0.9%: INTRAVENOUS | Qty: 1000 | Status: AC

## 2018-04-22 NOTE — Consult Note (Signed)
[address]       [place] [address]             [phone]        Camar Guyton [facility] Medical Record #[id number] Date of Birth: 1960/12/19  Referring: No ref. provider found Primary Care: Patient, No Pcp Per Primary Cardiologist:No primary care provider on file.  Chief Complaint:    Chief Complaint  Patient presents with  . Chest Pain    History of Present Illness:      Zachary [name] is a 57 year old male patient with a past medical history significant for hyperlipidemia and tobacco abuse who presented the night of 04/20/2018 with acute onset chest pain.  Earlier that day, he was in his normal state of health and played a full game of golf.  After dinner he began having 7 out of 10 substernal chest pressure with radiation to bilateral shoulders.  He did not have any associated shortness of breath, nausea, vomiting, or diaphoresis.  He tried [place] powder with minimal improvement.  His pain persisted therefore he took his wife's statin and ramipril and reported to [facility] for further evaluation.  At [facility], he was found to be hypertensive and had a troponin level of 1.4.  He was ruled in for non-STEMI.  His pain eventually subsided in the emergency department.  He was treated with IV heparin, aspirin, beta-blocker and statin.  He was transferred to [facility] underwent cardiac catheterization due to multiple risk factors including a family history of coronary artery disease.  On cardiac catheterization he was found to have a mid RCA lesion that was 90% stenosed, mid circumflex lesion which is 95% stenosed, proximal LAD lesion which was 75% stenosed, and a post atrio lesion which 50% stenosed.  He had an estimated left ventricular ejection fraction of 55 to 65%.  He underwent an echocardiogram that same day which showed mild to moderate mitral regurgitation, and mild to moderate tricuspid regurgitation.  On echocardiogram his estimated ejection  fraction was 55 to 60%.  Consult requested yesterday.   Considering CABG on Monday but patient has had chest pin during the night, likely dur to cir disease      Current Activity/ Functional Status: Patient was independent with mobility/ambulation, transfers, ADL's, IADL's.   Zubrod Score: At the time of surgery this patient's most appropriate activity status/level should be described as: []     0    Normal activity, no symptoms [x]     1    Restricted in physical strenuous activity but ambulatory, able to do out light work []     2    Ambulatory and capable of self care, unable to do work activities, up and about                 more than 50%  Of the time                            []     3    Only limited self care, in bed greater than 50% of waking hours []     4    Completely disabled, no self care, confined to bed or chair []     5    Moribund  Past Medical History:  Diagnosis Date  . Hyperlipemia     Past Surgical History:  Procedure Laterality Date  . LEFT HEART CATH AND CORONARY ANGIOGRAPHY N/A 04/21/2018  Procedure: LEFT HEART CATH AND CORONARY ANGIOGRAPHY;  Surgeon: Runell Gess, MD;  Location: [facility] INVASIVE CV LAB;  Service: Cardiovascular;  Laterality: N/A;    Social History   Tobacco Use  Smoking Status Current Every Day Smoker  . Types: Cigarettes  Smokeless Tobacco Never Used    Social History   Substance and Sexual Activity  Alcohol Use Yes     No Known Allergies  Current Facility-Administered Medications  Medication Dose Route Frequency Provider Last Rate Last Dose  . 0.9 %  sodium chloride infusion  250 mL Intravenous PRN Runell Gess, MD      . acetaminophen (TYLENOL) tablet 650 mg  650 mg Oral Q4H PRN Runell Gess, MD   650 mg at 04/23/18 0719  . ALPRAZolam Prudy Feeler) tablet 0.25 mg  0.25 mg Oral BID Runell Gess, MD   0.25 mg at 04/22/18 2204  . aspirin EC tablet 81 mg  81 mg Oral Daily Runell Gess, MD   81 mg at 04/22/18 6314  .  atorvastatin (LIPITOR) tablet 80 mg  80 mg Oral q1800 Runell Gess, MD   80 mg at 04/22/18 1743  . heparin ADULT infusion 100 units/mL (25000 units/254mL sodium chloride 0.45%)  1,400 Units/hr Intravenous Continuous Earnie Larsson, RPH 14 mL/hr at 04/22/18 2017 1,400 Units/hr at 04/22/18 2017  . metoprolol tartrate (LOPRESSOR) tablet 12.5 mg  12.5 mg Oral BID Runell Gess, MD   12.5 mg at 04/22/18 2204  . morphine 2 MG/ML injection 2 mg  2 mg Intravenous Q1H PRN Chrystie Nose, MD   2 mg at 04/23/18 0228  . nitroGLYCERIN (NITROSTAT) SL tablet 0.4 mg  0.4 mg Sublingual Q5 Min x 3 PRN Runell Gess, MD   0.4 mg at 04/22/18 1743  . nitroGLYCERIN 50 mg in dextrose 5 % 250 mL (0.2 mg/mL) infusion  0-200 mcg/min Intravenous Titrated Croitoru, Mihai, MD 21 mL/hr at 04/23/18 0455 70 mcg/min at 04/23/18 0455  . ondansetron (ZOFRAN) injection 4 mg  4 mg Intravenous Q6H PRN Runell Gess, MD      . sodium chloride flush (NS) 0.9 % injection 3 mL  3 mL Intravenous Q12H Runell Gess, MD   3 mL at 04/22/18 1232  . sodium chloride flush (NS) 0.9 % injection 3 mL  3 mL Intravenous PRN Runell Gess, MD        Medications Prior to Admission  Medication Sig Dispense Refill Last Dose  . Aspirin-Acetaminophen-Caffeine (GOODYS EXTRA STRENGTH) 500-325-65 MG PACK Take 1 Package by mouth as needed (pain).   04/20/2018 at 2000  . naproxen sodium (ALEVE) 220 MG tablet Take 440 mg by mouth as needed (pain).   04/17/2018  . valACYclovir (VALTREX) 500 MG tablet Take 500 mg by mouth 2 (two) times daily.   04/12/2018    Family History  Problem Relation Age of Onset  . CAD Father   . CAD Brother      Review of Systems:   Review of Systems  Constitutional: Negative for chills and fever.  HENT: Negative.   Respiratory: Negative for cough, shortness of breath and wheezing.   Cardiovascular: Positive for chest pain. Negative for palpitations and leg swelling.  Gastrointestinal: Negative for  heartburn, nausea and vomiting.  Endo/Heme/Allergies: Negative.    Pertinent items are noted in HPI.        Physical Exam: BP 127/75   Pulse 63   Temp 98.3 F (36.8 C) (Oral)  Resp 13   Ht 5\' 9"  (1.753 m)   Wt 192 lb 7.4 oz (87.3 kg)   SpO2 95%   BMI 28.42 kg/m    General appearance: alert, cooperative and no distress Resp: clear to auscultation bilaterally Cardio: regular rate and rhythm, S1, S2 normal, no murmur, click, rub or gallop GI: soft, non-tender; bowel sounds normal; no masses,  no organomegaly Extremities: extremities normal, atraumatic, no cyanosis or edema, good peripherial pulses. Legs with good anatomy but a few superficial spider veins. Right endoscopic knee surgery. Neurologic: Grossly normal  Diagnostic Studies & Laboratory data:     Recent Radiology Findings:   No results found.   I have independently reviewed the above radiologic studies and discussed with the patient   Recent Lab Findings: Lab Results  Component Value Date   WBC 12.3 (H) 04/23/2018   HGB 14.2 04/23/2018   HCT 42.6 04/23/2018   PLT 289 04/23/2018   GLUCOSE 130 (H) 04/23/2018   CHOL 187 04/21/2018   TRIG 86 04/21/2018   HDL 45 04/21/2018   LDLCALC 125 (H) 04/21/2018   NA 137 04/23/2018   K 4.4 04/23/2018   CL 102 04/23/2018   CREATININE 0.92 04/23/2018   BUN 11 04/23/2018   CO2 25 04/23/2018   INR 1.00 04/21/2018   Transthoracic Echocardiography  Patient:    Zachary, [name] MR #:       [id number] Study Date: 04/21/2018 Gender:     M Age:        51 Height:     176.5 cm Weight:     92.4 kg BSA:        2.15 m^2 Pt. Status: Room:       6E13C   Winfred Burn, MD  PERFORMING   Orpah Cobb, MD  REFERRING    Orpah Cobb, MD  ADMITTING    Zoila Shutter MD  ATTENDING    Melene Plan  SONOGRAPHER  Sinda Du, RDCS  cc:  ------------------------------------------------------------------- LV EF: 55% -    60%  ------------------------------------------------------------------- History:   PMH:  chest pain  PMH:   Myocardial infarction.  Risk factors:  Dyslipidemia.  ------------------------------------------------------------------- Study Conclusions  - Left ventricle: The cavity size was normal. There was moderate   focal basal septal and mild concentric hypertrophy. Systolic   function was normal. The estimated ejection fraction was in the   range of 55% to 60%. Wall motion was normal; there were no   regional wall motion abnormalities. Doppler parameters are   consistent with abnormal left ventricular relaxation (grade 1   diastolic dysfunction). The outflow tract showed mild   obstruction. - Mitral valve: Calcified annulus. There was mild to moderate   regurgitation. - Left atrium: The atrium was moderately dilated. - Right atrium: The atrium was moderately dilated. - Tricuspid valve: There was mild-moderate regurgitation.  ------------------------------------------------------------------- Study data:  No prior study was available for comparison.  Study status:  Routine.  Procedure:  Transthoracic echocardiography. Image quality was adequate.  Study completion:  There were no complications.          Transthoracic echocardiography.  M-mode, complete 2D, spectral Doppler, and color Doppler.  Birthdate: Patient birthdate: 02-02-1961.  Age:  Patient is 57 yr old.  Sex: Gender: male.    BMI: 29.7 kg/m^2.  Blood pressure:     110/70 Patient status:  Inpatient.  Study date:  Study date: 04/21/2018. Study time: 12:49 PM.  Location:  Echo laboratory.  -------------------------------------------------------------------  -------------------------------------------------------------------  Left ventricle:  The cavity size was normal. There was moderate focal basal septal and mild concentric hypertrophy. Systolic function was normal. The estimated ejection fraction was in  the range of 55% to 60%. Wall motion was normal; there were no regional wall motion abnormalities. The outflow tract showed mild obstruction. Doppler parameters are consistent with abnormal left ventricular relaxation (grade 1 diastolic dysfunction).  ------------------------------------------------------------------- Aortic valve:   Trileaflet; normal thickness leaflets. Mobility was not restricted. Sclerosis without stenosis.  Doppler: Transvalvular velocity was within the normal range. There was no stenosis. There was no regurgitation.    Peak gradient (S): 7 mm Hg.  ------------------------------------------------------------------- Aorta:  Aortic root: The aortic root was normal in size. Ascending aorta: The ascending aorta was well visualized, mildly ectatic, and moderately calcified.  ------------------------------------------------------------------- Mitral valve:   Calcified annulus. Mobility was not restricted. Doppler:  Transvalvular velocity was within the normal range. There was no evidence for stenosis. There was mild to moderate regurgitation.    Peak gradient (D): 3 mm Hg.  ------------------------------------------------------------------- Left atrium:  The atrium was moderately dilated.  ------------------------------------------------------------------- Right ventricle:  The cavity size was normal. Wall thickness was normal. The moderator band was prominent. Systolic function was normal.  ------------------------------------------------------------------- Pulmonic valve:    The valve appears to be grossly normal. Doppler:  Transvalvular velocity was within the normal range. There was no evidence for stenosis. There was mild regurgitation.  ------------------------------------------------------------------- Tricuspid valve:   Structurally normal valve.    Doppler: Transvalvular velocity was within the normal range. There was mild-moderate  regurgitation.  ------------------------------------------------------------------- Pulmonary artery:   The main pulmonary artery was normal-sized. Systolic pressure was within the normal range.  ------------------------------------------------------------------- Right atrium:  The atrium was moderately dilated.  ------------------------------------------------------------------- Pericardium:  There was no pericardial effusion.  ------------------------------------------------------------------- Systemic veins: Inferior vena cava: The vessel was dilated. The respirophasic diameter changes were blunted (< 50%), consistent with elevated central venous pressure.  ------------------------------------------------------------------- Measurements   Left ventricle                          Value        Reference  LV ID, ED, PLAX chordal         (L)     42.1  mm     43 - 52  LV ID, ES, PLAX chordal                 29.7  mm     23 - 38  LV fx shortening, PLAX chordal          29    %      >=29  LV PW thickness, ED                     13.4  mm     ----------  IVS/LV PW ratio, ED                     1.01         <=1.3  Stroke volume, 2D                       84    ml     ----------  Stroke volume/bsa, 2D                   39    ml/m^2 ----------  LV e&', lateral  8.58  cm/s   ----------  LV E/e&', lateral                        10.23        ----------  LV e&', medial                           14.8  cm/s   ----------  LV E/e&', medial                         5.93         ----------  LV e&', average                          11.69 cm/s   ----------  LV E/e&', average                        7.51         ----------    Ventricular septum                      Value        Reference  IVS thickness, ED                       13.5  mm     ----------    LVOT                                    Value        Reference  LVOT ID, S                              21    mm      ----------  LVOT area                               3.46  cm^2   ----------  LVOT mean velocity, S                   80.3  cm/s   ----------  LVOT VTI, S                             24.2  cm     ----------    Aortic valve                            Value        Reference  Aortic valve peak velocity, S           130   cm/s   ----------  Aortic peak gradient, S                 7     mm Hg  ----------    Aorta                                   Value        Reference  Aortic root  ID, ED                      32    mm     ----------    Left atrium                             Value        Reference  LA ID, A-P, ES                          38    mm     ----------  LA ID/bsa, A-P                          1.77  cm/m^2 <=2.2  LA volume, S                            57.3  ml     ----------  LA volume/bsa, S                        26.6  ml/m^2 ----------  LA volume, ES, 1-p A4C                  65    ml     ----------  LA volume/bsa, ES, 1-p A4C              30.2  ml/m^2 ----------  LA volume, ES, 1-p A2C                  48.1  ml     ----------  LA volume/bsa, ES, 1-p A2C              22.4  ml/m^2 ----------    Mitral valve                            Value        Reference  Mitral E-wave peak velocity             87.8  cm/s   ----------  Mitral A-wave peak velocity             70.8  cm/s   ----------  Mitral deceleration time        (H)     384   ms     150 - 230  Mitral peak gradient, D                 3     mm Hg  ----------  Mitral E/A ratio, peak                  1.2          ----------    Right atrium                            Value        Reference  RA ID, S-I, ES, A4C             (H)     53.4  mm     34 - 49  RA area, ES, A4C                (H)  20.9  cm^2   8.3 - 19.5  RA volume, ES, A/L                      65.6  ml     ----------  RA volume/bsa, ES, A/L                  30.5  ml/m^2 ----------    Systemic veins                          Value        Reference  Estimated CVP                            3     mm Hg  ----------    Right ventricle                         Value        Reference  RV ID, minor axis, ED, A4C base         30.4  mm     ----------  TAPSE                                   20.3  mm     ----------  RV s&', lateral, S                       13.5  cm/s   ----------  Legend: (L)  and  (H)  mark values outside specified reference range.  ------------------------------------------------------------------- Prepared and Electronically Authenticated by  Orpah Cobb, MD 2019-06-27T13:59:36  Cath: Procedures   LEFT HEART CATH AND CORONARY ANGIOGRAPHY  Conclusion     Mid RCA lesion is 90% stenosed.  Post Atrio lesion is 50% stenosed.  Mid Cx lesion is 95% stenosed.  Prox LAD lesion is 75% stenosed.  The left ventricular systolic function is normal.  LV end diastolic pressure is normal.  The left ventricular ejection fraction is 55-65% by visual estimate.     Coronary Findings   Diagnostic  Dominance: Right  Left Anterior Descending  Prox LAD lesion 75% stenosed  Prox LAD lesion is 75% stenosed.  Left Circumflex  Mid Cx lesion 95% stenosed  Mid Cx lesion is 95% stenosed. The lesion is thrombotic.  Right Coronary Artery  Mid RCA lesion 90% stenosed  Mid RCA lesion is 90% stenosed.  Right Posterior Atrioventricular Branch  Post Atrio lesion 50% stenosed  Post Atrio lesion is 50% stenosed.   Hemo Data    Most Recent Value  AO Systolic Pressure 110 mmHg  AO Diastolic Pressure 66 mmHg  AO Mean 85 mmHg  LV Systolic Pressure 134 mmHg  LV Diastolic Pressure 4 mmHg  LV EDP 8 mmHg  Arterial Occlusion Pressure Extended Systolic Pressure 152 mmHg  Arterial Occlusion Pressure Extended Diastolic Pressure 76 mmHg  Arterial Occlusion Pressure Extended Mean Pressure 106 mmHg  Left Ventricular Apex Extended Systolic Pressure 142 mmHg  Left Ventricular Apex Extended Diastolic Pressure 8 mmHg  Left Ventricular Apex Extended EDP  Pressure 15 mmHg    Assessment / Plan:     1. NSTEMI- peak troponin 1.46. No current chest pain.  2. Hypertension- on Metoprolol 12.5mg  BID. Bradycardia at times with rates in the 40s  3.  Hyperlipidemia-Continue Lipitor 4. Smoking cessation discussed. Patient plans to be compliant  5. Echocardiogram review. LVEF preserved and mild to moderate TR and MR.    Plan: The procedure of coronary artery bypass grafting was explained in detail and all questions were answered to the patient's and wife's satisfaction. With the episodic chest pain last night  On heparin and high grade cix lesion will proceed with emergency CABG today.   The goals risks and alternatives of the planned surgical procedure  Emergency CABG have been discussed with the patient in detail. The risks of the procedure including death, infection, stroke, myocardial infarction, bleeding, blood transfusion have all been discussed specifically.  I have quoted Larene Pickett a 4 % of perioperative mortality and a complication rate as high as 40  %. The patient's questions have been answered.Lanson Randle is willing  to proceed with the planned procedure.   Delight Ovens MD      [address] [place] [place] [address] Office [phone]   Beeper [phone]  04/23/2018 9:02 AM

## 2018-04-22 NOTE — Care Management (Signed)
CM requested attending group to discontinue discharge order as pt is currently on IV heparin and Aggrastat.  Pt may be potential CABG candidate - CVTS consulted

## 2018-04-22 NOTE — Progress Notes (Signed)
Progress Note  Patient Name: Zachary [name] Date of Encounter: 04/22/2018  Primary Cardiologist: Dr Jens Som  Subjective   Brief CP early AM but pain free now; no dyspnea  Inpatient Medications    Scheduled Meds: . ALPRAZolam  0.25 mg Oral BID  . aspirin EC  81 mg Oral Daily  . atorvastatin  80 mg Oral q1800  . metoprolol tartrate  12.5 mg Oral BID  . sodium chloride flush  3 mL Intravenous Q12H   Continuous Infusions: . sodium chloride    . heparin    . tirofiban 0.15 mcg/kg/min (04/22/18 0622)   PRN Meds: sodium chloride, acetaminophen, morphine injection, nitroGLYCERIN, ondansetron (ZOFRAN) IV, sodium chloride flush   Vital Signs    Vitals:   04/22/18 0322 04/22/18 0400 04/22/18 0540 04/22/18 0704  BP: 106/69 119/67  125/81  Pulse: 76 (!) 49  62  Resp: 17 14  15   Temp: 97.7 F (36.5 C)   98.2 F (36.8 C)  TempSrc: Oral   Oral  SpO2: 96%     Weight:   200 lb 2.8 oz (90.8 kg)   Height:        Intake/Output Summary (Last 24 hours) at 04/22/2018 0817 Last data filed at 04/22/2018 0700 Gross per 24 hour  Intake 991.84 ml  Output 800 ml  Net 191.84 ml   Filed Weights   04/21/18 0428 04/21/18 1808 04/22/18 0540  Weight: 203 lb 11.2 oz (92.4 kg) 203 lb 7.8 oz (92.3 kg) 200 lb 2.8 oz (90.8 kg)    Telemetry    Sinus- Personally Reviewed  Physical Exam   GEN: No acute distress.   Neck: No JVD Cardiac: RRR, no murmurs, rubs, or gallops.  Respiratory: Clear to auscultation bilaterally. GI: Soft, nontender, non-distended  MS: No edema; radial cath site with no hematoma Neuro:  Nonfocal  Psych: Normal affect   Labs    Chemistry Recent Labs  Lab 04/20/18 2130 04/21/18 0755 04/22/18 0444  NA 132* 140 140  K 3.7 4.7 3.7  CL 100 104 108  CO2 24 27 26   GLUCOSE 146* 117* 108*  BUN 15 9 11   CREATININE 1.01 0.90 0.88  CALCIUM 8.9 9.1 7.9*  GFRNONAA >60 >60 >60  GFRAA >60 >60 >60  ANIONGAP 8 9 6      Hematology Recent Labs  Lab 04/20/18 2130  04/21/18 0755 04/22/18 0444  WBC 14.2* 9.5 8.2  RBC 4.92 4.95 4.36  HGB 15.1 14.7 13.0  HCT 43.1 44.4 39.5  MCV 87.6 89.7 90.6  MCH 30.7 29.7 29.8  MCHC 35.0 33.1 32.9  RDW 13.3 13.1 13.0  PLT 306 304 281    Cardiac Enzymes Recent Labs  Lab 04/20/18 2130 04/21/18 0235 04/21/18 0755 04/21/18 1438  TROPONINI 1.44* 1.44* 1.46* 1.02*    Radiology    Dg Chest 2 View  Result Date: 04/20/2018 CLINICAL DATA:  Midsternal chest pain with back and left shoulder pain while playing golf today. EXAM: CHEST - 2 VIEW COMPARISON:  None. FINDINGS: The heart size and mediastinal contours are within normal limits. Minimal aortic atherosclerosis. No aneurysm. Both lungs are clear. Slight accentuation of thoracic curvature secondary to multilevel mild disc space narrowing and endplate spurring. Osteoarthritis of the [facility] joints right greater than left with spurring and joint space narrowing. No acute osseous abnormality. IMPRESSION: 1. No active cardiopulmonary disease. 2. Minimal aortic atherosclerosis. 3. Mild thoracic spondylosis. 4. 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 Electronically Signed   By: Onalee Hua  Sterling Big M.D.   On: 04/20/2018 21:26   Patient Profile     57 y.o. male status post non-ST elevation myocardial infarction.  Cardiac catheterization reveals three-vessel coronary artery disease.  Patient is awaiting coronary artery bypass graft.  Echocardiogram shows normal LV function, mild to moderate mitral regurgitation and biatrial enlargement.  Assessment & Plan    1 non-ST elevation myocardial infarction-plan to continue aspirin, heparin, statin, metoprolol and Aggrastat.  Add nitroglycerin paste.  Discussed with Dr. Allyson Sabal.  Plan is for coronary artery bypass and graft.  We will ask for CVTS consult.    2 Tobacco abuse-patient previously counseled on discontinuing.    3 hyperlipidemia-Continue Lipitor 80 mg daily.  Check lipids and liver in 4 weeks.    For questions or  updates, please contact [facility] Please consult [email or web address] for contact info under Cardiology/STEMI.      Signed, Olga Millers, MD  04/22/2018, 8:17 AM

## 2018-04-22 NOTE — Progress Notes (Addendum)
Pt placed on CPAP for HS using nasal mask and 2 L of O2 bled in. Auto titration mode minimum 4 and maximum 20. Pt does not wear CPAP at home but has discussed a sleep study with his MD.Pt is familiar with equipment and procedure and able to remove mask in an emergency.

## 2018-04-22 NOTE — Progress Notes (Signed)
ANTICOAGULATION CONSULT NOTE   Pharmacy Consult for heparin Indication: NSTEMI No Known Allergies  Patient Measurements: Height: 5\' 9"  (175.3 cm) Weight: 200 lb 2.8 oz (90.8 kg) IBW/kg (Calculated) : 70.7 Heparin Dosing Weight: 90Kg  Vital Signs: Temp: 98.2 F (36.8 C) (06/28 1928) Temp Source: Oral (06/28 1928) BP: 138/69 (06/28 1928) Pulse Rate: 53 (06/28 1928)  Labs: Recent Labs    04/20/18 2130 04/21/18 0235 04/21/18 0755 04/21/18 1438 04/22/18 0444 04/22/18 1824  HGB 15.1  --  14.7  --  13.0  --   HCT 43.1  --  44.4  --  39.5  --   PLT 306  --  304  --  281  --   LABPROT  --   --  13.1  --   --   --   INR  --   --  1.00  --   --   --   HEPARINUNFRC  --   --  0.44  --   --  0.21*  CREATININE 1.01  --  0.90  --  0.88  --   TROPONINI 1.44* 1.44* 1.46* 1.02*  --   --    Estimated Creatinine Clearance: 100.6 mL/min (by C-G formula based on SCr of 0.88 mg/dL).  Medical History: Past Medical History:  Diagnosis Date  . Hyperlipemia    Medications:  Medications Prior to Admission  Medication Sig Dispense Refill Last Dose  . Aspirin-Acetaminophen-Caffeine (GOODYS EXTRA STRENGTH) 500-325-65 MG PACK Take 1 Package by mouth as needed (pain).   04/20/2018 at 2000  . naproxen sodium (ALEVE) 220 MG tablet Take 440 mg by mouth as needed (pain).   04/17/2018  . valACYclovir (VALTREX) 500 MG tablet Take 500 mg by mouth 2 (two) times daily.   04/12/2018   Assessment: 57yo male c/o chest pressure on heparin while awaiting cardiac cath.  Cath 6/27 shows 3 vessel disease with preserved LV function - follow up for possible CABG in future.  Heparin level low this evening at 0.21. No bleeding or IV issues noted. Will adjust rate and recheck level in am.   Goal of Therapy:  Heparin level 0.3-0.7 units/ml  Monitor platelets by anticoagulation protocol: Yes   Plan:  Increase heparin gtt to 1400 units/hr  Daily heparin level/CBC  Monitor for s/sx of bleeding F/U plans for  possible CABG evaluation  Sheppard Coil PharmD., BCPS Clinical Pharmacist 04/22/2018 7:45 PM

## 2018-04-22 NOTE — Progress Notes (Signed)
Cardiology Follow up  Mr. Silvan discomfort has improved on 30 mcg of Nitro. He reports pain is around 0-1 currently. It was previously described as a 5/10 chest pressure. He feels comfortable and will try to get some rest. Educated that we hope to get him pain free and that I'm available if needed.  Channing Mutters, MD

## 2018-04-23 ENCOUNTER — Inpatient Hospital Stay (HOSPITAL_COMMUNITY): Payer: 59 | Admitting: Certified Registered Nurse Anesthetist

## 2018-04-23 ENCOUNTER — Encounter (HOSPITAL_COMMUNITY)
Admission: EM | Disposition: A | Discharge status: Home / Self Care | Payer: Self-pay | Source: Home / Self Care | Attending: Cardiothoracic Surgery

## 2018-04-23 ENCOUNTER — Inpatient Hospital Stay (HOSPITAL_COMMUNITY): Payer: 59

## 2018-04-23 DIAGNOSIS — Z01810 Encounter for preprocedural cardiovascular examination: Secondary | ICD-10-CM

## 2018-04-23 DIAGNOSIS — Z951 Presence of aortocoronary bypass graft: Secondary | ICD-10-CM

## 2018-04-23 DIAGNOSIS — I25110 Atherosclerotic heart disease of native coronary artery with unstable angina pectoris: Secondary | ICD-10-CM

## 2018-04-23 DIAGNOSIS — I214 Non-ST elevation (NSTEMI) myocardial infarction: Secondary | ICD-10-CM

## 2018-04-23 DIAGNOSIS — I10 Essential (primary) hypertension: Secondary | ICD-10-CM

## 2018-04-23 HISTORY — PX: CORONARY ARTERY BYPASS GRAFT: SHX141

## 2018-04-23 HISTORY — PX: TEE WITHOUT CARDIOVERSION: SHX5443

## 2018-04-23 LAB — POCT I-STAT, CHEM 8
BUN: 12 mg/dL (ref 6–20)
BUN: 10 mg/dL (ref 6–20)
BUN: 8 mg/dL (ref 6–20)
BUN: 10 mg/dL (ref 6–20)
BUN: 11 mg/dL (ref 6–20)
BUN: 9 mg/dL (ref 6–20)
CALCIUM ION: 1.11 mmol/L — AB (ref 1.15–1.40)
CALCIUM ION: 1.11 mmol/L — AB (ref 1.15–1.40)
CHLORIDE: 98 mmol/L (ref 98–111)
CHLORIDE: 99 mmol/L (ref 98–111)
CHLORIDE: 98 mmol/L (ref 98–111)
CHLORIDE: 99 mmol/L (ref 98–111)
CREATININE: 0.6 mg/dL — AB (ref 0.61–1.24)
CREATININE: 0.6 mg/dL — AB (ref 0.61–1.24)
CREATININE: 0.5 mg/dL — AB (ref 0.61–1.24)
Calcium, Ion: 1.23 mmol/L (ref 1.15–1.40)
Calcium, Ion: 1.22 mmol/L (ref 1.15–1.40)
Calcium, Ion: 1.05 mmol/L — ABNORMAL LOW (ref 1.15–1.40)
Calcium, Ion: 1.13 mmol/L — ABNORMAL LOW (ref 1.15–1.40)
Chloride: 98 mmol/L (ref 98–111)
Chloride: 100 mmol/L (ref 98–111)
Creatinine, Ser: 0.6 mg/dL — ABNORMAL LOW (ref 0.61–1.24)
Creatinine, Ser: 0.7 mg/dL (ref 0.61–1.24)
Creatinine, Ser: 0.6 mg/dL — ABNORMAL LOW (ref 0.61–1.24)
GLUCOSE: 102 mg/dL — AB (ref 70–99)
Glucose, Bld: 130 mg/dL — ABNORMAL HIGH (ref 70–99)
Glucose, Bld: 114 mg/dL — ABNORMAL HIGH (ref 70–99)
Glucose, Bld: 134 mg/dL — ABNORMAL HIGH (ref 70–99)
Glucose, Bld: 146 mg/dL — ABNORMAL HIGH (ref 70–99)
Glucose, Bld: 138 mg/dL — ABNORMAL HIGH (ref 70–99)
HCT: 39 % (ref 39.0–52.0)
HCT: 30 % — ABNORMAL LOW (ref 39.0–52.0)
HCT: 28 % — ABNORMAL LOW (ref 39.0–52.0)
HCT: 30 % — ABNORMAL LOW (ref 39.0–52.0)
HEMATOCRIT: 33 % — AB (ref 39.0–52.0)
HEMATOCRIT: 28 % — AB (ref 39.0–52.0)
Hemoglobin: 13.3 g/dL (ref 13.0–17.0)
Hemoglobin: 11.2 g/dL — ABNORMAL LOW (ref 13.0–17.0)
Hemoglobin: 10.2 g/dL — ABNORMAL LOW (ref 13.0–17.0)
Hemoglobin: 9.5 g/dL — ABNORMAL LOW (ref 13.0–17.0)
Hemoglobin: 9.5 g/dL — ABNORMAL LOW (ref 13.0–17.0)
Hemoglobin: 10.2 g/dL — ABNORMAL LOW (ref 13.0–17.0)
POTASSIUM: 3.8 mmol/L (ref 3.5–5.1)
POTASSIUM: 4 mmol/L (ref 3.5–5.1)
POTASSIUM: 4.1 mmol/L (ref 3.5–5.1)
POTASSIUM: 4.3 mmol/L (ref 3.5–5.1)
Potassium: 4.8 mmol/L (ref 3.5–5.1)
Potassium: 3.8 mmol/L (ref 3.5–5.1)
SODIUM: 134 mmol/L — AB (ref 135–145)
SODIUM: 135 mmol/L (ref 135–145)
SODIUM: 138 mmol/L (ref 135–145)
Sodium: 137 mmol/L (ref 135–145)
Sodium: 137 mmol/L (ref 135–145)
Sodium: 138 mmol/L (ref 135–145)
TCO2: 28 mmol/L (ref 22–32)
TCO2: 27 mmol/L (ref 22–32)
TCO2: 24 mmol/L (ref 22–32)
TCO2: 25 mmol/L (ref 22–32)
TCO2: 26 mmol/L (ref 22–32)
TCO2: 24 mmol/L (ref 22–32)

## 2018-04-23 LAB — POCT I-STAT 3, ART BLOOD GAS (G3+)
Acid-base deficit: 1 mmol/L (ref 0.0–2.0)
BICARBONATE: 25.7 mmol/L (ref 20.0–28.0)
Bicarbonate: 24.8 mmol/L (ref 20.0–28.0)
O2 SAT: 100 %
O2 Saturation: 100 %
PO2 ART: 361 mmHg — AB (ref 83.0–108.0)
TCO2: 27 mmol/L (ref 22–32)
TCO2: 26 mmol/L (ref 22–32)
pCO2 arterial: 44.2 mmHg (ref 32.0–48.0)
pCO2 arterial: 47.4 mmHg (ref 32.0–48.0)
pH, Arterial: 7.372 (ref 7.350–7.450)
pH, Arterial: 7.328 — ABNORMAL LOW (ref 7.350–7.450)
pO2, Arterial: 187 mmHg — ABNORMAL HIGH (ref 83.0–108.0)

## 2018-04-23 LAB — BASIC METABOLIC PANEL
Anion gap: 10 (ref 5–15)
BUN: 11 mg/dL (ref 6–20)
CHLORIDE: 102 mmol/L (ref 98–111)
CO2: 25 mmol/L (ref 22–32)
CREATININE: 0.92 mg/dL (ref 0.61–1.24)
Calcium: 9 mg/dL (ref 8.9–10.3)
GFR calc non Af Amer: >60 mL/min (ref >60)
Glucose, Bld: 130 mg/dL — ABNORMAL HIGH (ref 70–99)
Potassium: 4.4 mmol/L (ref 3.5–5.1)
SODIUM: 137 mmol/L (ref 135–145)

## 2018-04-23 LAB — CBC
HCT: 42.6 % (ref 39.0–52.0)
HCT: 35.9 % — ABNORMAL LOW (ref 39.0–52.0)
HCT: 34.9 % — ABNORMAL LOW (ref 39.0–52.0)
HEMOGLOBIN: 14.2 g/dL (ref 13.0–17.0)
HEMOGLOBIN: 11.9 g/dL — AB (ref 13.0–17.0)
Hemoglobin: 11.6 g/dL — ABNORMAL LOW (ref 13.0–17.0)
MCH: 30 pg (ref 26.0–34.0)
MCH: 29.9 pg (ref 26.0–34.0)
MCH: 30.2 pg (ref 26.0–34.0)
MCHC: 33.3 g/dL (ref 30.0–36.0)
MCHC: 33.1 g/dL (ref 30.0–36.0)
MCHC: 33.2 g/dL (ref 30.0–36.0)
MCV: 90.1 fL (ref 78.0–100.0)
MCV: 90.2 fL (ref 78.0–100.0)
MCV: 90.9 fL (ref 78.0–100.0)
PLATELETS: 150 10*3/uL (ref 150–400)
Platelets: 289 10*3/uL (ref 150–400)
Platelets: 180 10*3/uL (ref 150–400)
RBC: 4.73 MIL/uL (ref 4.22–5.81)
RBC: 3.98 MIL/uL — AB (ref 4.22–5.81)
RBC: 3.84 MIL/uL — ABNORMAL LOW (ref 4.22–5.81)
RDW: 12.9 % (ref 11.5–15.5)
RDW: 12.8 % (ref 11.5–15.5)
RDW: 13.2 % (ref 11.5–15.5)
WBC: 12.3 10*3/uL — ABNORMAL HIGH (ref 4.0–10.5)
WBC: 13.7 10*3/uL — ABNORMAL HIGH (ref 4.0–10.5)
WBC: 14.2 10*3/uL — ABNORMAL HIGH (ref 4.0–10.5)

## 2018-04-23 LAB — COMPREHENSIVE METABOLIC PANEL
ALT: 20 U/L (ref 0–44)
AST: 31 U/L (ref 15–41)
Albumin: 3.7 g/dL (ref 3.5–5.0)
Alkaline Phosphatase: 53 U/L (ref 38–126)
Anion gap: 10 (ref 5–15)
BUN: 11 mg/dL (ref 6–20)
CO2: 26 mmol/L (ref 22–32)
Calcium: 9 mg/dL (ref 8.9–10.3)
Chloride: 99 mmol/L (ref 98–111)
Creatinine, Ser: 0.87 mg/dL (ref 0.61–1.24)
GFR calc Af Amer: >60 mL/min (ref >60)
GFR calc non Af Amer: >60 mL/min (ref >60)
Glucose, Bld: 132 mg/dL — ABNORMAL HIGH (ref 70–99)
Potassium: 4.1 mmol/L (ref 3.5–5.1)
Sodium: 135 mmol/L (ref 135–145)
Total Bilirubin: 0.9 mg/dL (ref 0.3–1.2)
Total Protein: 6.4 g/dL — ABNORMAL LOW (ref 6.5–8.1)

## 2018-04-23 LAB — GLUCOSE, CAPILLARY
GLUCOSE-CAPILLARY: 114 mg/dL — AB (ref 70–99)
GLUCOSE-CAPILLARY: 111 mg/dL — AB (ref 70–99)
GLUCOSE-CAPILLARY: 106 mg/dL — AB (ref 70–99)
GLUCOSE-CAPILLARY: 101 mg/dL — AB (ref 70–99)
GLUCOSE-CAPILLARY: 100 mg/dL — AB (ref 70–99)
GLUCOSE-CAPILLARY: 154 mg/dL — AB (ref 70–99)
Glucose-Capillary: 101 mg/dL — ABNORMAL HIGH (ref 70–99)

## 2018-04-23 LAB — CK TOTAL AND CKMB (NOT AT ARMC)
CK TOTAL: 358 U/L (ref 49–397)
CK, MB: 49.3 ng/mL — AB (ref 0.5–5.0)
Relative Index: 13.8 — ABNORMAL HIGH (ref 0.0–2.5)

## 2018-04-23 LAB — CREATININE, SERUM
Creatinine, Ser: 0.81 mg/dL (ref 0.61–1.24)
GFR calc Af Amer: >60 mL/min (ref >60)
GFR calc non Af Amer: >60 mL/min (ref >60)

## 2018-04-23 LAB — PROTIME-INR
INR: 0.98
INR: 1.3
PROTHROMBIN TIME: 16.1 s — AB (ref 11.4–15.2)
Prothrombin Time: 12.9 seconds (ref 11.4–15.2)

## 2018-04-23 LAB — MAGNESIUM
Magnesium: 1.7 mg/dL (ref 1.7–2.4)
Magnesium: 2.6 mg/dL — ABNORMAL HIGH (ref 1.7–2.4)

## 2018-04-23 LAB — PLATELET COUNT: Platelets: 207 10*3/uL (ref 150–400)

## 2018-04-23 LAB — HEMOGLOBIN AND HEMATOCRIT, BLOOD
HCT: 28.3 % — ABNORMAL LOW (ref 39.0–52.0)
Hemoglobin: 9.6 g/dL — ABNORMAL LOW (ref 13.0–17.0)

## 2018-04-23 LAB — TROPONIN I: Troponin I: 1.57 ng/mL (ref <0.03)

## 2018-04-23 LAB — APTT
APTT: 30 s (ref 24–36)
aPTT: 169 seconds (ref 24–36)

## 2018-04-23 LAB — SURGICAL PCR SCREEN
MRSA, PCR: NEGATIVE
Staphylococcus aureus: NEGATIVE

## 2018-04-23 LAB — HEMOGLOBIN A1C
Hgb A1c MFr Bld: 5.9 % — ABNORMAL HIGH (ref 4.8–5.6)
Mean Plasma Glucose: 122.63 mg/dL

## 2018-04-23 LAB — HEPARIN LEVEL (UNFRACTIONATED): HEPARIN UNFRACTIONATED: 0.56 [IU]/mL (ref 0.30–0.70)

## 2018-04-23 LAB — ABO/RH: ABO/RH(D): O POS

## 2018-04-23 SURGERY — CORONARY ARTERY BYPASS GRAFTING (CABG)
Anesthesia: General | Site: Chest

## 2018-04-23 MED ORDER — ACETAMINOPHEN 650 MG RE SUPP
650.0000 mg | Freq: Once | RECTAL | Status: AC
  Administered 2018-04-23: 650 mg via RECTAL

## 2018-04-23 MED ORDER — ROCURONIUM BROMIDE 100 MG/10ML IV SOLN
INTRAVENOUS | Status: DC | PRN
  Administered 2018-04-23: 80 mg via INTRAVENOUS
  Administered 2018-04-23: 20 mg via INTRAVENOUS
  Administered 2018-04-23 (×3): 50 mg via INTRAVENOUS

## 2018-04-23 MED ORDER — METOPROLOL TARTRATE 12.5 MG HALF TABLET: 12.5000 mg | ORAL_TABLET | Freq: Once | ORAL | Status: DC

## 2018-04-23 MED ORDER — AMIODARONE HCL IN DEXTROSE 360-4.14 MG/200ML-% IV SOLN
30.0000 mg/h | INTRAVENOUS | Status: DC
  Filled 2018-04-23 (×2): qty 200

## 2018-04-23 MED ORDER — CEFUROXIME SODIUM 1.5 G IV SOLR
1.5000 g | INTRAVENOUS | Status: AC
  Administered 2018-04-23: 1.5 g via INTRAVENOUS
  Administered 2018-04-23: .75 g via INTRAVENOUS
  Filled 2018-04-23: qty 1.5

## 2018-04-23 MED ORDER — CALCIUM CHLORIDE 10 % IV SOLN
INTRAVENOUS | Status: DC | PRN
  Administered 2018-04-23: 1 g via INTRAVENOUS

## 2018-04-23 MED ORDER — ACETAMINOPHEN 500 MG PO TABS
1000.0000 mg | ORAL_TABLET | Freq: Four times a day (QID) | ORAL | Status: DC
  Administered 2018-04-23 – 2018-04-25 (×8): 1000 mg via ORAL
  Filled 2018-04-23 (×8): qty 2

## 2018-04-23 MED ORDER — SODIUM CHLORIDE 0.9 % IV SOLN
INTRAVENOUS | Status: DC
  Administered 2018-04-23: 20 mL via INTRAVENOUS
  Administered 2018-04-25: 35 mL via INTRAVENOUS

## 2018-04-23 MED ORDER — BISACODYL 10 MG RE SUPP: 10.0000 mg | Freq: Every day | RECTAL | Status: DC

## 2018-04-23 MED ORDER — AMIODARONE HCL IN DEXTROSE 360-4.14 MG/200ML-% IV SOLN
30.0000 mg/h | INTRAVENOUS | Status: DC
  Administered 2018-04-24: 30 mg/h via INTRAVENOUS

## 2018-04-23 MED ORDER — ACETAMINOPHEN 160 MG/5ML PO SOLN: 650.0000 mg | Freq: Once | ORAL | Status: AC

## 2018-04-23 MED ORDER — MIDAZOLAM HCL 5 MG/5ML IJ SOLN
INTRAMUSCULAR | Status: DC | PRN
  Administered 2018-04-23 (×6): 2 mg via INTRAVENOUS

## 2018-04-23 MED ORDER — LACTATED RINGERS IV SOLN
INTRAVENOUS | Status: DC
  Administered 2018-04-23: 20 mL via INTRAVENOUS
  Administered 2018-04-24: 02:00:00 via INTRAVENOUS

## 2018-04-23 MED ORDER — PROPOFOL 10 MG/ML IV BOLUS
INTRAVENOUS | Status: AC
  Filled 2018-04-23: qty 40

## 2018-04-23 MED ORDER — SODIUM CHLORIDE 0.9 % IV SOLN
INTRAVENOUS | Status: AC
  Administered 2018-04-23: 1.4 [IU]/h via INTRAVENOUS
  Filled 2018-04-23: qty 1

## 2018-04-23 MED ORDER — MILRINONE LACTATE IN DEXTROSE 20-5 MG/100ML-% IV SOLN
0.1250 ug/kg/min | INTRAVENOUS | Status: DC
  Filled 2018-04-23: qty 100

## 2018-04-23 MED ORDER — LACTATED RINGERS IV SOLN: 500.0000 mL | Freq: Once | INTRAVENOUS | Status: DC | PRN

## 2018-04-23 MED ORDER — HEPARIN SODIUM (PORCINE) 1000 UNIT/ML IJ SOLN
INTRAMUSCULAR | Status: DC | PRN
  Administered 2018-04-23: 31000 [IU] via INTRAVENOUS
  Administered 2018-04-23: 5000 [IU] via INTRAVENOUS

## 2018-04-23 MED ORDER — NITROGLYCERIN IN D5W 200-5 MCG/ML-% IV SOLN
2.0000 ug/min | INTRAVENOUS | Status: DC
  Filled 2018-04-23: qty 250

## 2018-04-23 MED ORDER — METOPROLOL TARTRATE 25 MG/10 ML ORAL SUSPENSION: 12.5000 mg | Freq: Two times a day (BID) | ORAL | Status: DC

## 2018-04-23 MED ORDER — MORPHINE SULFATE (PF) 2 MG/ML IV SOLN
1.0000 mg | INTRAVENOUS | Status: AC | PRN
  Filled 2018-04-23: qty 1

## 2018-04-23 MED ORDER — MORPHINE SULFATE (PF) 2 MG/ML IV SOLN
2.0000 mg | INTRAVENOUS | Status: DC | PRN
  Administered 2018-04-23 – 2018-04-24 (×5): 2 mg via INTRAVENOUS
  Filled 2018-04-23 (×4): qty 1

## 2018-04-23 MED ORDER — POTASSIUM CHLORIDE 10 MEQ/50ML IV SOLN
10.0000 meq | INTRAVENOUS | Status: AC
  Administered 2018-04-23 (×3): 10 meq via INTRAVENOUS

## 2018-04-23 MED ORDER — CHLORHEXIDINE GLUCONATE 0.12 % MT SOLN: 15.0000 mL | Freq: Once | OROMUCOSAL | Status: DC

## 2018-04-23 MED ORDER — CHLORHEXIDINE GLUCONATE CLOTH 2 % EX PADS: 6.0000 | MEDICATED_PAD | Freq: Once | CUTANEOUS | Status: DC

## 2018-04-23 MED ORDER — 0.9 % SODIUM CHLORIDE (POUR BTL) OPTIME
TOPICAL | Status: DC | PRN
  Administered 2018-04-23: 6000 mL

## 2018-04-23 MED ORDER — SODIUM CHLORIDE 0.45 % IV SOLN
INTRAVENOUS | Status: DC | PRN
  Administered 2018-04-23: 20 mL via INTRAVENOUS

## 2018-04-23 MED ORDER — SODIUM CHLORIDE 0.9% FLUSH: 3.0000 mL | INTRAVENOUS | Status: DC | PRN

## 2018-04-23 MED ORDER — MIDAZOLAM HCL 2 MG/2ML IJ SOLN: 2.0000 mg | INTRAMUSCULAR | Status: DC | PRN

## 2018-04-23 MED ORDER — SODIUM CHLORIDE 0.9 % IV SOLN: 250.0000 mL | INTRAVENOUS | Status: DC

## 2018-04-23 MED ORDER — SODIUM CHLORIDE 0.9% FLUSH
3.0000 mL | Freq: Two times a day (BID) | INTRAVENOUS | Status: DC
  Administered 2018-04-24 – 2018-04-25 (×2): 3 mL via INTRAVENOUS

## 2018-04-23 MED ORDER — ONDANSETRON HCL 4 MG/2ML IJ SOLN: 4.0000 mg | Freq: Four times a day (QID) | INTRAMUSCULAR | Status: DC | PRN

## 2018-04-23 MED ORDER — SODIUM CHLORIDE 0.9 % IV SOLN
1.5000 g | Freq: Two times a day (BID) | INTRAVENOUS | Status: DC
  Administered 2018-04-24 – 2018-04-25 (×3): 1.5 g via INTRAVENOUS
  Filled 2018-04-23 (×4): qty 1.5

## 2018-04-23 MED ORDER — AMIODARONE HCL 200 MG PO TABS
400.0000 mg | ORAL_TABLET | Freq: Every day | ORAL | Status: DC
Start: 2018-05-01 — End: 2018-04-27

## 2018-04-23 MED ORDER — VANCOMYCIN HCL IN DEXTROSE 1-5 GM/200ML-% IV SOLN
1000.0000 mg | Freq: Once | INTRAVENOUS | Status: AC
  Administered 2018-04-23: 1000 mg via INTRAVENOUS
  Filled 2018-04-23: qty 200

## 2018-04-23 MED ORDER — MIDAZOLAM HCL 2 MG/2ML IJ SOLN
INTRAMUSCULAR | Status: AC
  Filled 2018-04-23: qty 2

## 2018-04-23 MED ORDER — LACTATED RINGERS IV SOLN: INTRAVENOUS | Status: DC

## 2018-04-23 MED ORDER — SODIUM CHLORIDE 0.9 % IV SOLN
1500.0000 mg | INTRAVENOUS | Status: AC
  Administered 2018-04-23: 1500 mg via INTRAVENOUS
  Filled 2018-04-23: qty 1000

## 2018-04-23 MED ORDER — ASPIRIN 81 MG PO CHEW: 324.0000 mg | CHEWABLE_TABLET | Freq: Every day | ORAL | Status: DC

## 2018-04-23 MED ORDER — SODIUM CHLORIDE 0.9 % IV SOLN
INTRAVENOUS | Status: DC
  Filled 2018-04-23: qty 1

## 2018-04-23 MED ORDER — DOPAMINE-DEXTROSE 3.2-5 MG/ML-% IV SOLN
0.0000 ug/kg/min | INTRAVENOUS | Status: DC
  Filled 2018-04-23: qty 250

## 2018-04-23 MED ORDER — MAGNESIUM SULFATE 4 GM/100ML IV SOLN
4.0000 g | Freq: Once | INTRAVENOUS | Status: AC
  Administered 2018-04-23: 4 g via INTRAVENOUS
  Filled 2018-04-23: qty 100

## 2018-04-23 MED ORDER — PHENYLEPHRINE HCL 10 MG/ML IJ SOLN
INTRAMUSCULAR | Status: DC | PRN
  Administered 2018-04-23: 40 ug via INTRAVENOUS
  Administered 2018-04-23: 80 ug via INTRAVENOUS
  Administered 2018-04-23 (×2): 40 ug via INTRAVENOUS
  Administered 2018-04-23: 80 ug via INTRAVENOUS
  Administered 2018-04-23 (×3): 40 ug via INTRAVENOUS

## 2018-04-23 MED ORDER — DEXMEDETOMIDINE HCL IN NACL 400 MCG/100ML IV SOLN: 0.0000 ug/kg/h | INTRAVENOUS | Status: DC

## 2018-04-23 MED ORDER — PROTAMINE SULFATE 10 MG/ML IV SOLN
INTRAVENOUS | Status: DC | PRN
  Administered 2018-04-23: 50 mg via INTRAVENOUS
  Administered 2018-04-23: 25 mg via INTRAVENOUS
  Administered 2018-04-23: 50 mg via INTRAVENOUS
  Administered 2018-04-23: 25 mg via INTRAVENOUS
  Administered 2018-04-23 (×3): 50 mg via INTRAVENOUS

## 2018-04-23 MED ORDER — FENTANYL CITRATE (PF) 250 MCG/5ML IJ SOLN
INTRAMUSCULAR | Status: AC
  Filled 2018-04-23: qty 10

## 2018-04-23 MED ORDER — LACTATED RINGERS IV SOLN
INTRAVENOUS | Status: DC | PRN
  Administered 2018-04-23 (×2): via INTRAVENOUS

## 2018-04-23 MED ORDER — MIDAZOLAM HCL 10 MG/2ML IJ SOLN
INTRAMUSCULAR | Status: AC
  Filled 2018-04-23: qty 2

## 2018-04-23 MED ORDER — EPHEDRINE SULFATE 50 MG/ML IJ SOLN
INTRAMUSCULAR | Status: DC | PRN
  Administered 2018-04-23: 5 mg via INTRAVENOUS

## 2018-04-23 MED ORDER — SODIUM CHLORIDE 0.9 % IV SOLN
INTRAVENOUS | Status: DC | PRN
  Administered 2018-04-23: 10:00:00 via INTRAVENOUS

## 2018-04-23 MED ORDER — TRANEXAMIC ACID (OHS) BOLUS VIA INFUSION
15.0000 mg/kg | INTRAVENOUS | Status: AC
  Administered 2018-04-23: 1309.5 mg via INTRAVENOUS
  Filled 2018-04-23: qty 1310

## 2018-04-23 MED ORDER — ALBUMIN HUMAN 5 % IV SOLN
250.0000 mL | INTRAVENOUS | Status: AC | PRN
  Administered 2018-04-23 (×3): 250 mL via INTRAVENOUS
  Filled 2018-04-23 (×2): qty 250

## 2018-04-23 MED ORDER — TEMAZEPAM 15 MG PO CAPS: 15.0000 mg | ORAL_CAPSULE | Freq: Once | ORAL | Status: DC | PRN

## 2018-04-23 MED ORDER — OXYCODONE HCL 5 MG PO TABS: 5.0000 mg | ORAL_TABLET | ORAL | Status: DC | PRN

## 2018-04-23 MED ORDER — NITROGLYCERIN IN D5W 200-5 MCG/ML-% IV SOLN: 0.0000 ug/min | INTRAVENOUS | Status: DC

## 2018-04-23 MED ORDER — DEXMEDETOMIDINE HCL IN NACL 400 MCG/100ML IV SOLN
0.1000 ug/kg/h | INTRAVENOUS | Status: AC
  Administered 2018-04-23: .2 ug/kg/h via INTRAVENOUS
  Filled 2018-04-23: qty 100

## 2018-04-23 MED ORDER — CHLORHEXIDINE GLUCONATE 0.12 % MT SOLN
15.0000 mL | OROMUCOSAL | Status: AC
  Administered 2018-04-23: 15 mL via OROMUCOSAL
  Filled 2018-04-23: qty 15

## 2018-04-23 MED ORDER — PROPOFOL 10 MG/ML IV BOLUS
INTRAVENOUS | Status: DC | PRN
  Administered 2018-04-23: 10 mg via INTRAVENOUS
  Administered 2018-04-23 (×2): 20 mg via INTRAVENOUS

## 2018-04-23 MED ORDER — ACETAMINOPHEN 160 MG/5ML PO SOLN: 1000.0000 mg | Freq: Four times a day (QID) | ORAL | Status: DC

## 2018-04-23 MED ORDER — AMIODARONE HCL 200 MG PO TABS
400.0000 mg | ORAL_TABLET | Freq: Two times a day (BID) | ORAL | Status: DC
  Administered 2018-04-24 – 2018-04-26 (×6): 400 mg via ORAL
  Filled 2018-04-23 (×6): qty 2

## 2018-04-23 MED ORDER — PHENYLEPHRINE HCL-NACL 20-0.9 MG/250ML-% IV SOLN
0.0000 ug/min | INTRAVENOUS | Status: DC
  Administered 2018-04-23: 10 ug/min via INTRAVENOUS
  Administered 2018-04-24: 20 ug/min via INTRAVENOUS
  Filled 2018-04-23 (×2): qty 250

## 2018-04-23 MED ORDER — MAGNESIUM SULFATE 50 % IJ SOLN
40.0000 meq | INTRAMUSCULAR | Status: DC
  Filled 2018-04-23: qty 9.85

## 2018-04-23 MED ORDER — PANTOPRAZOLE SODIUM 40 MG PO TBEC
40.0000 mg | DELAYED_RELEASE_TABLET | Freq: Every day | ORAL | Status: DC
  Administered 2018-04-25: 40 mg via ORAL
  Filled 2018-04-23: qty 1

## 2018-04-23 MED ORDER — TRAMADOL HCL 50 MG PO TABS
50.0000 mg | ORAL_TABLET | ORAL | Status: DC | PRN
  Administered 2018-04-24 (×2): 100 mg via ORAL
  Administered 2018-04-24: 50 mg via ORAL
  Filled 2018-04-23 (×2): qty 2
  Filled 2018-04-23: qty 1

## 2018-04-23 MED ORDER — SODIUM CHLORIDE 0.9 % IV SOLN
30.0000 ug/min | INTRAVENOUS | Status: AC
  Administered 2018-04-23: 50 ug/min via INTRAVENOUS
  Filled 2018-04-23: qty 20

## 2018-04-23 MED ORDER — INSULIN REGULAR BOLUS VIA INFUSION
0.0000 [IU] | Freq: Three times a day (TID) | INTRAVENOUS | Status: DC
  Administered 2018-04-24: 5 [IU] via INTRAVENOUS
  Filled 2018-04-23: qty 10

## 2018-04-23 MED ORDER — FENTANYL CITRATE (PF) 250 MCG/5ML IJ SOLN
INTRAMUSCULAR | Status: DC | PRN
  Administered 2018-04-23 (×2): 250 ug via INTRAVENOUS
  Administered 2018-04-23: 100 ug via INTRAVENOUS
  Administered 2018-04-23: 200 ug via INTRAVENOUS
  Administered 2018-04-23: 50 ug via INTRAVENOUS
  Administered 2018-04-23: 250 ug via INTRAVENOUS

## 2018-04-23 MED ORDER — DOCUSATE SODIUM 100 MG PO CAPS
200.0000 mg | ORAL_CAPSULE | Freq: Every day | ORAL | Status: DC
  Administered 2018-04-24: 200 mg via ORAL
  Filled 2018-04-23 (×2): qty 2

## 2018-04-23 MED ORDER — FENTANYL CITRATE (PF) 250 MCG/5ML IJ SOLN
INTRAMUSCULAR | Status: AC
  Filled 2018-04-23: qty 20

## 2018-04-23 MED ORDER — SODIUM CHLORIDE 0.9 % IV SOLN
INTRAVENOUS | Status: DC
  Filled 2018-04-23: qty 30

## 2018-04-23 MED ORDER — AMIODARONE HCL IN DEXTROSE 360-4.14 MG/200ML-% IV SOLN
60.0000 mg/h | INTRAVENOUS | Status: AC
  Administered 2018-04-23: 60 mg/h via INTRAVENOUS
  Filled 2018-04-23 (×2): qty 200

## 2018-04-23 MED ORDER — POTASSIUM CHLORIDE 2 MEQ/ML IV SOLN
80.0000 meq | INTRAVENOUS | Status: DC
  Filled 2018-04-23: qty 40

## 2018-04-23 MED ORDER — METOPROLOL TARTRATE 5 MG/5ML IV SOLN: 2.5000 mg | INTRAVENOUS | Status: DC | PRN

## 2018-04-23 MED ORDER — PLASMA-LYTE 148 IV SOLN
INTRAVENOUS | Status: AC
  Administered 2018-04-23: 500 mL
  Filled 2018-04-23: qty 2.5

## 2018-04-23 MED ORDER — ALBUMIN HUMAN 5 % IV SOLN
INTRAVENOUS | Status: DC | PRN
  Administered 2018-04-23 (×2): via INTRAVENOUS

## 2018-04-23 MED ORDER — TRANEXAMIC ACID 1000 MG/10ML IV SOLN
1.5000 mg/kg/h | INTRAVENOUS | Status: AC
  Administered 2018-04-23: 1.5 mg/kg/h via INTRAVENOUS
  Filled 2018-04-23: qty 25

## 2018-04-23 MED ORDER — BISACODYL 5 MG PO TBEC: 5.0000 mg | DELAYED_RELEASE_TABLET | Freq: Once | ORAL | Status: DC

## 2018-04-23 MED ORDER — CHLORHEXIDINE GLUCONATE CLOTH 2 % EX PADS
6.0000 | MEDICATED_PAD | Freq: Once | CUTANEOUS | Status: AC
  Administered 2018-04-23: 6 via TOPICAL

## 2018-04-23 MED ORDER — BISACODYL 5 MG PO TBEC
10.0000 mg | DELAYED_RELEASE_TABLET | Freq: Every day | ORAL | Status: DC
  Administered 2018-04-24: 10 mg via ORAL
  Filled 2018-04-23 (×2): qty 2

## 2018-04-23 MED ORDER — FAMOTIDINE IN NACL 20-0.9 MG/50ML-% IV SOLN
20.0000 mg | Freq: Two times a day (BID) | INTRAVENOUS | Status: DC
  Administered 2018-04-23: 20 mg via INTRAVENOUS

## 2018-04-23 MED ORDER — TRANEXAMIC ACID (OHS) PUMP PRIME SOLUTION
2.0000 mg/kg | INTRAVENOUS | Status: DC
  Filled 2018-04-23: qty 1.75

## 2018-04-23 MED ORDER — AMIODARONE HCL IN DEXTROSE 360-4.14 MG/200ML-% IV SOLN
60.0000 mg/h | INTRAVENOUS | Status: AC
  Administered 2018-04-23: 60 mg/h via INTRAVENOUS

## 2018-04-23 MED ORDER — SODIUM CHLORIDE 0.9 % IV SOLN
750.0000 mg | INTRAVENOUS | Status: DC
  Filled 2018-04-23: qty 750

## 2018-04-23 MED ORDER — EPINEPHRINE PF 1 MG/ML IJ SOLN
0.0000 ug/min | INTRAVENOUS | Status: DC
  Filled 2018-04-23: qty 4

## 2018-04-23 MED ORDER — METOPROLOL TARTRATE 12.5 MG HALF TABLET
12.5000 mg | ORAL_TABLET | Freq: Two times a day (BID) | ORAL | Status: DC
  Administered 2018-04-24 – 2018-04-25 (×3): 12.5 mg via ORAL
  Filled 2018-04-23 (×3): qty 1

## 2018-04-23 MED ORDER — HEMOSTATIC AGENTS (NO CHARGE) OPTIME
TOPICAL | Status: DC | PRN
  Administered 2018-04-23 (×3): 1 via TOPICAL

## 2018-04-23 MED ORDER — ASPIRIN EC 325 MG PO TBEC
325.0000 mg | DELAYED_RELEASE_TABLET | Freq: Every day | ORAL | Status: DC
  Administered 2018-04-24 – 2018-04-25 (×2): 325 mg via ORAL
  Filled 2018-04-23 (×2): qty 1

## 2018-04-23 SURGICAL SUPPLY — 67 items
BAG DECANTER FOR FLEXI CONT (MISCELLANEOUS) ×3 IMPLANT
BANDAGE ACE 4X5 VEL STRL LF (GAUZE/BANDAGES/DRESSINGS) ×3 IMPLANT
BANDAGE ACE 6X5 VEL STRL LF (GAUZE/BANDAGES/DRESSINGS) ×3 IMPLANT
BANDAGE ELASTIC 4 VELCRO ST LF (GAUZE/BANDAGES/DRESSINGS) ×3 IMPLANT
BANDAGE ELASTIC 6 VELCRO ST LF (GAUZE/BANDAGES/DRESSINGS) ×3 IMPLANT
BLADE STERNUM SYSTEM 6 (BLADE) ×3 IMPLANT
BNDG GAUZE ELAST 4 BULKY (GAUZE/BANDAGES/DRESSINGS) ×3 IMPLANT
CANISTER SUCT 3000ML PPV (MISCELLANEOUS) ×3 IMPLANT
CATH CPB KIT GERHARDT (MISCELLANEOUS) ×3 IMPLANT
CATH THORACIC 28FR (CATHETERS) ×3 IMPLANT
CRADLE DONUT ADULT HEAD (MISCELLANEOUS) ×3 IMPLANT
DRAIN CHANNEL 28F RND 3/8 FF (WOUND CARE) ×3 IMPLANT
DRAPE CARDIOVASCULAR INCISE (DRAPES) ×1
DRAPE SLUSH/WARMER DISC (DRAPES) ×3 IMPLANT
DRAPE SRG 135X102X78XABS (DRAPES) ×2 IMPLANT
DRSG AQUACEL AG ADV 3.5X14 (GAUZE/BANDAGES/DRESSINGS) ×3 IMPLANT
ELECT BLADE 4.0 EZ CLEAN MEGAD (MISCELLANEOUS) ×3
ELECT REM PT RETURN 9FT ADLT (ELECTROSURGICAL) ×6
ELECTRODE BLDE 4.0 EZ CLN MEGD (MISCELLANEOUS) ×2 IMPLANT
ELECTRODE REM PT RTRN 9FT ADLT (ELECTROSURGICAL) ×4 IMPLANT
FELT TEFLON 1X6 (MISCELLANEOUS) ×3 IMPLANT
GAUZE SPONGE 4X4 12PLY STRL (GAUZE/BANDAGES/DRESSINGS) ×6 IMPLANT
GAUZE SPONGE 4X4 12PLY STRL LF (GAUZE/BANDAGES/DRESSINGS) ×6 IMPLANT
GLOVE BIO SURGEON STRL SZ 6 (GLOVE) ×6 IMPLANT
GLOVE BIO SURGEON STRL SZ 6.5 (GLOVE) ×12 IMPLANT
GLOVE BIOGEL PI IND STRL 6 (GLOVE) ×10 IMPLANT
GLOVE BIOGEL PI IND STRL 6.5 (GLOVE) ×4 IMPLANT
GLOVE BIOGEL PI INDICATOR 6 (GLOVE) ×5
GLOVE BIOGEL PI INDICATOR 6.5 (GLOVE) ×2
GOWN STRL REUS W/ TWL LRG LVL3 (GOWN DISPOSABLE) ×16 IMPLANT
GOWN STRL REUS W/TWL LRG LVL3 (GOWN DISPOSABLE) ×8
HEMOSTAT POWDER SURGIFOAM 1G (HEMOSTASIS) IMPLANT
HEMOSTAT SURGICEL 2X14 (HEMOSTASIS) ×3 IMPLANT
KIT BASIN OR (CUSTOM PROCEDURE TRAY) ×3 IMPLANT
KIT CATH SUCT 8FR (CATHETERS) ×3 IMPLANT
KIT SUCTION CATH 14FR (SUCTIONS) ×6 IMPLANT
KIT TURNOVER KIT B (KITS) ×3 IMPLANT
KIT VASOVIEW HEMOPRO VH 3000 (KITS) ×3 IMPLANT
LEAD PACING MYOCARDI (MISCELLANEOUS) ×3 IMPLANT
MARKER GRAFT CORONARY BYPASS (MISCELLANEOUS) ×9 IMPLANT
NS IRRIG 1000ML POUR BTL (IV SOLUTION) ×18 IMPLANT
PACK E OPEN HEART (SUTURE) ×3 IMPLANT
PACK OPEN HEART (CUSTOM PROCEDURE TRAY) ×3 IMPLANT
PAD ARMBOARD 7.5X6 YLW CONV (MISCELLANEOUS) ×6 IMPLANT
PAD ELECT DEFIB RADIOL ZOLL (MISCELLANEOUS) ×3 IMPLANT
PENCIL BUTTON HOLSTER BLD 10FT (ELECTRODE) ×3 IMPLANT
PUNCH AORTIC ROTATE  4.5MM 8IN (MISCELLANEOUS) ×3 IMPLANT
SET CARDIOPLEGIA MPS 5001102 (MISCELLANEOUS) ×3 IMPLANT
SPONGE LAP 18X18 X RAY DECT (DISPOSABLE) ×6 IMPLANT
SUT BONE WAX W31G (SUTURE) ×3 IMPLANT
SUT PROLENE 3 0 SH1 36 (SUTURE) ×3 IMPLANT
SUT PROLENE 4 0 TF (SUTURE) ×6 IMPLANT
SUT PROLENE 6 0 CC (SUTURE) ×12 IMPLANT
SUT PROLENE 7 0 BV1 MDA (SUTURE) ×3 IMPLANT
SUT PROLENE 8 0 BV175 6 (SUTURE) ×3 IMPLANT
SUT STEEL 6MS V (SUTURE) ×3 IMPLANT
SUT STEEL SZ 6 DBL 3X14 BALL (SUTURE) ×3 IMPLANT
SUT VIC AB 1 CTX 18 (SUTURE) ×6 IMPLANT
SYSTEM SAHARA CHEST DRAIN ATS (WOUND CARE) ×3 IMPLANT
TAPE CLOTH SURG 4X10 WHT LF (GAUZE/BANDAGES/DRESSINGS) ×3 IMPLANT
TAPE PAPER 2X10 WHT MICROPORE (GAUZE/BANDAGES/DRESSINGS) ×3 IMPLANT
TOWEL GREEN STERILE (TOWEL DISPOSABLE) ×3 IMPLANT
TOWEL GREEN STERILE FF (TOWEL DISPOSABLE) IMPLANT
TRAY FOLEY SLVR 16FR TEMP STAT (SET/KITS/TRAYS/PACK) ×3 IMPLANT
TUBING INSUFFLATION (TUBING) ×3 IMPLANT
UNDERPAD 30X30 (UNDERPADS AND DIAPERS) ×3 IMPLANT
WATER STERILE IRR 1000ML POUR (IV SOLUTION) ×6 IMPLANT

## 2018-04-23 NOTE — Progress Notes (Signed)
Progress Note  Patient Name: Zachary [name] Date of Encounter: 04/23/2018  Primary Cardiologist: Dr Jens Som  Subjective   Patient apparently has had intermittent chest pain through the night.  It is in the neck and back area and similar to his presenting symptoms.  He had mild pain at the time of my evaluation this morning.  He denies dyspnea.  Inpatient Medications    Scheduled Meds: . ALPRAZolam  0.25 mg Oral BID  . aspirin EC  81 mg Oral Daily  . atorvastatin  80 mg Oral q1800  . metoprolol tartrate  12.5 mg Oral BID  . sodium chloride flush  3 mL Intravenous Q12H   Continuous Infusions: . sodium chloride    . heparin 1,400 Units/hr (04/22/18 2017)  . nitroGLYCERIN 70 mcg/min (04/23/18 0455)   PRN Meds: sodium chloride, acetaminophen, morphine injection, nitroGLYCERIN, ondansetron (ZOFRAN) IV, sodium chloride flush   Vital Signs    Vitals:   04/23/18 0026 04/23/18 0234 04/23/18 0629 04/23/18 0734  BP: 111/61 110/78  127/75  Pulse: (!) 50 (!) 56  63  Resp: 16 12  13   Temp: 98.3 F (36.8 C) 98.2 F (36.8 C)  98.3 F (36.8 C)  TempSrc: Oral Oral  Oral  SpO2: 99% 98%  95%  Weight:   192 lb 7.4 oz (87.3 kg)   Height:        Intake/Output Summary (Last 24 hours) at 04/23/2018 0801 Last data filed at 04/23/2018 0027 Gross per 24 hour  Intake 753.18 ml  Output 650 ml  Net 103.18 ml   Filed Weights   04/21/18 1808 04/22/18 0540 04/23/18 0629  Weight: 203 lb 7.8 oz (92.3 kg) 200 lb 2.8 oz (90.8 kg) 192 lb 7.4 oz (87.3 kg)    Telemetry    Sinus- Personally Reviewed  Physical Exam   GEN: WD WN No acute distress.   Neck: No JVD, supple Cardiac: RRR Respiratory: CTA GI: Soft, NT, ND MS: No edema Neuro:  Grossly intact   Labs    Chemistry Recent Labs  Lab 04/21/18 0755 04/22/18 0444 04/23/18 0239  NA 140 140 137  K 4.7 3.7 4.4  CL 104 108 102  CO2 27 26 25   GLUCOSE 117* 108* 130*  BUN 9 11 11   CREATININE 0.90 0.88 0.92  CALCIUM 9.1 7.9* 9.0    GFRNONAA >60 >60 >60  GFRAA >60 >60 >60  ANIONGAP 9 6 10      Hematology Recent Labs  Lab 04/21/18 0755 04/22/18 0444 04/23/18 0239  WBC 9.5 8.2 12.3*  RBC 4.95 4.36 4.73  HGB 14.7 13.0 14.2  HCT 44.4 39.5 42.6  MCV 89.7 90.6 90.1  MCH 29.7 29.8 30.0  MCHC 33.1 32.9 33.3  RDW 13.1 13.0 12.9  PLT 304 281 289    Cardiac Enzymes Recent Labs  Lab 04/20/18 2130 04/21/18 0235 04/21/18 0755 04/21/18 1438  TROPONINI 1.44* 1.44* 1.46* 1.02*    Radiology    No results found. Patient Profile     57 y.o. male status post non-ST elevation myocardial infarction.  Cardiac catheterization reveals three-vessel coronary artery disease.  Patient is awaiting coronary artery bypass graft.  Echocardiogram shows normal LV function, mild to moderate mitral regurgitation and biatrial enlargement.  Assessment & Plan    1 non-ST elevation myocardial infarction-patient with recurrent chest pain last night and improved with IV nitroglycerin/morphine.  Recurrent symptoms this morning.  Electrocardiogram shows slight inferior lateral ST elevation.  I will continue aspirin, heparin, beta-blocker and nitroglycerin.  I have personally reviewed the patient's previous catheterization films and echocardiogram with Dr. Tyrone Sage. Given recurrent symptoms despite medical therapy and subtle lateral ST elevation I have asked Dr. Tyrone Sage to see this morning for consideration of emergent coronary artery bypass and graft.  We will recycle enzymes.  2 Tobacco abuse-patient previously counseled on discontinuing.    3 hyperlipidemia-continue statin.    For questions or updates, please contact [facility] Please consult [email or web address] for contact info under Cardiology/STEMI.      Signed, Olga Millers, MD  04/23/2018, 8:01 AM

## 2018-04-23 NOTE — Progress Notes (Signed)
  Notified Dr. Tyrone Sage re ABG results.  Instructed to increase vent rate to 14 and volume to 650.  RT made aware.

## 2018-04-23 NOTE — Anesthesia Procedure Notes (Signed)
Central Venous Catheter Insertion Performed by: Val Eagle, MD, anesthesiologist Start/End6/29/2019 10:29 AM, 04/23/2018 10:37 AM Patient location: OR. Preanesthetic checklist: patient identified, IV checked, site marked, risks and benefits discussed, surgical consent, monitors and equipment checked, pre-op evaluation, timeout performed and anesthesia consent Hand hygiene performed  and maximum sterile barriers used  PA cath was placed.Swan type:thermodilution PA Cath depth:47 Procedure performed without using ultrasound guided technique. Attempts: 1 Patient tolerated the procedure well with no immediate complications.

## 2018-04-23 NOTE — Transfer of Care (Signed)
Immediate Anesthesia Transfer of Care Note  Patient: Zachary [name]  Procedure(s) Performed: CORONARY ARTERY BYPASS GRAFTING (CABG) x3 using the right greater saphenous vein harvested endoscopically and the left internal mammary artery. LIMA to LAD, SVG to Distal Circ, SVG to PD (N/A Chest) TRANSESOPHAGEAL ECHOCARDIOGRAM (TEE) (N/A )  Patient Location: SICU  Anesthesia Type:General  Level of Consciousness: Patient remains intubated per anesthesia plan  Airway & Oxygen Therapy: Patient remains intubated per anesthesia plan and Patient placed on Ventilator (see vital sign flow sheet for setting)  Post-op Assessment: Report given to RN and Post -op Vital signs reviewed and stable  Post vital signs: Reviewed and stable  Last Vitals:  Vitals Value Taken Time  BP 86/60 04/23/2018  3:51 PM  Temp 35.4 C 04/23/2018  3:52 PM  Pulse 83 04/23/2018  3:52 PM  Resp 12 04/23/2018  3:52 PM  SpO2 96 % 04/23/2018  3:52 PM  Vitals shown include unvalidated device data.  Last Pain:  Vitals:   04/23/18 0734  TempSrc: Oral  PainSc:          Complications: No apparent anesthesia complications

## 2018-04-23 NOTE — Progress Notes (Signed)
Dr. Tyrone Sage, MD (CVTS) at bedside for evaluation.  Reviews EKG.  After patient assessment and discussion re: options, decision is made to proceed w/urgent CABG this AM.  STAT orders being placed per Dr. Tyrone Sage.

## 2018-04-23 NOTE — Progress Notes (Signed)
Pre-op Cardiac Surgery  Carotid Findings:  Findings suggest 1-39% internal carotid artery stenosis bilaterally. Vertebral arteries are patent with antegrade flow.  Upper Extremity Right Left  Brachial Waveforms Triphasic Triphasic  Radial Waveforms Triphasic Triphasic  Ulnar Waveforms Triphasic Triphasic  Palmar Arch (Allen's Test) Within normal limits Within normal limits    Lower  Extremity Right Left  Dorsalis Pedis Triphasic Triphasic  Posterior Tibial Triphasic Triphasic   Bilateral pedal waveforms are within normal limits at rest.  04/23/2018 9:31 AM Gertie Fey, BS, RVT, RDCS, RDMS

## 2018-04-23 NOTE — Anesthesia Procedure Notes (Signed)
Procedure Name: Intubation Date/Time: 04/23/2018 10:21 AM Performed by: Eligha Bridegroom, CRNA Pre-anesthesia Checklist: Patient identified, Emergency Drugs available, Suction available, Patient being monitored and Timeout performed Patient Re-evaluated:Patient Re-evaluated prior to induction Oxygen Delivery Method: Circle system utilized Preoxygenation: Pre-oxygenation with 100% oxygen Ventilation: Mask ventilation without difficulty and Oral airway inserted - appropriate to patient size Laryngoscope Size: Mac and 4 Grade View: Grade I Tube type: Oral Number of attempts: 1 Airway Equipment and Method: Video-laryngoscopy Placement Confirmation: ETT inserted through vocal cords under direct vision,  positive ETCO2 and breath sounds checked- equal and bilateral Secured at: 22 cm Tube secured with: Tape Dental Injury: Teeth and Oropharynx as per pre-operative assessment

## 2018-04-23 NOTE — Progress Notes (Signed)
Dr. Jens Som, MD (Cardiology) at bedside for evaluation; also updated on patient condition and overnight status.  Verbal order for EKG and stat cardiac enzymes.

## 2018-04-23 NOTE — Brief Op Note (Signed)
      [address]       [name] [address]             [phone]     04/23/2018  3:27 PM  PATIENT:  Zachary [name]  57 y.o. male  PRE-OPERATIVE DIAGNOSIS:  Acute MI  POST-OPERATIVE DIAGNOSIS:  Acute MI, af intraop  PROCEDURE:  Procedure(s): CORONARY ARTERY BYPASS GRAFTING (CABG) x3 using the right greater saphenous vein harvested endoscopically and the left internal mammary artery. LIMA to LAD, SVG to Distal Circ, SVG to PD (N/A) TRANSESOPHAGEAL ECHOCARDIOGRAM (TEE) (N/A)  SURGEON:  Surgeon(s) and Role:    * Delight Ovens, MD - Primary  PHYSICIAN ASSISTANT: WAYNE GOLD PA-C  ANESTHESIA:   general  EBL:  1100 mL   BLOOD ADMINISTERED:none  DRAINS: PLEURAL AND PERICARDIAL CHEST DRAINS   LOCAL MEDICATIONS USED:  NONE  SPECIMEN:  No Specimen  DISPOSITION OF SPECIMEN:  N/A  COUNTS:  YES  DICTATION: .Other Dictation: Dictation Number PENDING  PLAN OF CARE: Admit to inpatient   PATIENT DISPOSITION:  ICU - intubated and hemodynamically stable.   Delay start of Pharmacological VTE agent (>24hrs) due to surgical blood loss or risk of bleeding: yes  COMPLICATIONS  NO KNOWN

## 2018-04-23 NOTE — Anesthesia Procedure Notes (Signed)
Central Venous Catheter Insertion Performed by: Val Eagle, MD, anesthesiologist Start/End6/29/2019 10:29 AM, 04/23/2018 10:37 AM Patient location: OR. Preanesthetic checklist: patient identified, IV checked, site marked, risks and benefits discussed, surgical consent, monitors and equipment checked, pre-op evaluation, timeout performed and anesthesia consent Position: supine Hand hygiene performed  and maximum sterile barriers used  Catheter size: 9 Fr Total catheter length 10. Sheath introducer Procedure performed using ultrasound guided technique. Ultrasound Notes:anatomy identified, needle tip was noted to be adjacent to the nerve/plexus identified, no ultrasound evidence of intravascular and/or intraneural injection and image(s) printed for medical record Attempts: 1 Following insertion, line sutured, dressing applied and Biopatch. Post procedure assessment: blood return through all ports, free fluid flow and no air  Patient tolerated the procedure well with no immediate complications.

## 2018-04-23 NOTE — Progress Notes (Signed)
Patient transported to pre-op holding area per RN and monitor.  Consents for OR and Blood products both signed and on the chart; pre-CABG blood work including type and screen obtained; blood bank aware of need to units on-call to OR; vascular US pre-CABG completed and first CHG bath done.  Patient medicated w/ASA, Metoprolol and Xanax.  Wife escorted to waiting area.

## 2018-04-23 NOTE — Procedures (Signed)
Extubation Procedure Note  Patient Details:   Name: Zachary [name] DOB: Aug 14, 1961 MRN: [id number]   Airway Documentation:    Vent end date: 04/23/18 Vent end time: 1835   Evaluation  O2 sats: stable throughout Complications: No apparent complications Patient did tolerate procedure well. Bilateral Breath Sounds: Clear   Yes   Patient extubated to a 4L [place] per the rapid wean protocol. Cuff leak was heard. No stridor was noted. RN at bedside with RT during extubation. Patient was able to perform IS with no problems.  Darolyn Rua 04/23/2018, 6:44 PM

## 2018-04-23 NOTE — Plan of Care (Signed)
  Problem: Clinical Measurements: Goal: Ability to maintain clinical measurements within normal limits will improve Outcome: Progressing Goal: Cardiovascular complication will be avoided Outcome: Progressing   Problem: Pain Managment: Goal: General experience of comfort will improve Outcome: Progressing   

## 2018-04-23 NOTE — Anesthesia Preprocedure Evaluation (Signed)
Anesthesia Evaluation  Patient identified by MRN, date of birth, ID band Patient awake    Reviewed: Allergy & Precautions, NPO status , Patient's Chart, lab work & pertinent test results  History of Anesthesia Complications Negative for: history of anesthetic complications  Airway Mallampati: II  TM Distance: >3 FB Neck ROM: Full    Dental  (+) Teeth Intact, Caps   Pulmonary neg shortness of breath, neg COPD, neg recent URI, Current Smoker,    breath sounds clear to auscultation       Cardiovascular + angina + CAD and + Past MI  (-) CHF (-) dysrhythmias  Rhythm:Regular     Neuro/Psych negative neurological ROS  negative psych ROS   GI/Hepatic negative GI ROS, Neg liver ROS,   Endo/Other  negative endocrine ROS  Renal/GU negative Renal ROS     Musculoskeletal negative musculoskeletal ROS (+)   Abdominal   Peds  Hematology negative hematology ROS (+)   Anesthesia Other Findings Cath 6/27:   Mid RCA lesion is 90% stenosed. Post Atrio lesion is 50% stenosed. Mid Cx lesion is 95% stenosed. Prox LAD lesion is 75% stenosed. The left ventricular systolic function is normal. LV end diastolic pressure is normal. The left ventricular ejection fraction is 55-65% by visual estimate.  TTE 6/27: Left ventricle: The cavity size was normal. There was moderate   focal basal septal and mild concentric hypertrophy. Systolic   function was normal. The estimated ejection fraction was in the   range of 55% to 60%. Wall motion was normal; there were no   regional wall motion abnormalities. Doppler parameters are   consistent with abnormal left ventricular relaxation (grade 1   diastolic dysfunction). The outflow tract showed mild   obstruction. - Mitral valve: Calcified annulus. There was mild to moderate   regurgitation. - Left atrium: The atrium was moderately dilated. - Right atrium: The atrium was moderately dilated. -  Tricuspid valve: There was mild-moderate regurgitation.  Reproductive/Obstetrics                             Anesthesia Physical Anesthesia Plan  ASA: IV  Anesthesia Plan: General   Post-op Pain Management:    Induction: Intravenous  PONV Risk Score and Plan: 1 and Treatment may vary due to age or medical condition  Airway Management Planned: Oral ETT  Additional Equipment: Arterial line, CVP, PA Cath, TEE and Ultrasound Guidance Line Placement  Intra-op Plan:   Post-operative Plan: Post-operative intubation/ventilation  Informed Consent: I have reviewed the patients History and Physical, chart, labs and discussed the procedure including the risks, benefits and alternatives for the proposed anesthesia with the patient or authorized representative who has indicated his/her understanding and acceptance.   Dental advisory given  Plan Discussed with: CRNA and Surgeon  Anesthesia Plan Comments:         Anesthesia Quick Evaluation

## 2018-04-23 NOTE — Progress Notes (Signed)
Patient had a VC of 1L and a NIF of -30 with good effort.

## 2018-04-23 NOTE — Progress Notes (Signed)
Call placed to Gershon Crane, PA for CVTS to update re: patient status.  Report from overnight that patient w/continued chest pressure throughout chest wall, upper neck to jaw and felt between shoulder blades.  NTG gtt has been titrated from - 48mcg/min overnight and patient continues to be symptomatic.  CVTS has yet to see patient for consult recently placed.  CABG is presently scheduled for Monday AM.  This writer told that message would be relayed to Dr. Tyrone Sage, MD.

## 2018-04-23 NOTE — Progress Notes (Addendum)
Patient ID: Zachary [name], male   DOB: 1961/03/09, 57 y.o.   MRN: [id number] EVENING ROUNDS NOTE :     [address]       [name] [address]             [phone]                 Day of Surgery Procedure(s) (LRB): CORONARY ARTERY BYPASS GRAFTING (CABG) x3 using the right greater saphenous vein harvested endoscopically and the left internal mammary artery. LIMA to LAD, SVG to Distal Circ, SVG to PD (N/A) TRANSESOPHAGEAL ECHOCARDIOGRAM (TEE) (N/A)  Total Length of Stay:  LOS: 3 days  BP (!) 89/64   Pulse 88   Temp (!) 97.3 F (36.3 C)   Resp 20   Ht 5\' 9"  (1.753 m)   Wt 192 lb 7.4 oz (87.3 kg)   SpO2 99%   BMI 28.42 kg/m   .Intake/Output      06/29 0701 - 06/30 0700   P.O.    I.V. (mL/kg) 3383 (38.8)   IV Piggyback 649.8   Total Intake(mL/kg) 4032.8 (46.2)   Urine (mL/kg/hr) 2075 (2)   Stool    Blood 1100   Chest Tube 64   Total Output 3239   Net +793.8         . sodium chloride 20 mL (04/23/18 1555)  . [START ON 04/24/2018] sodium chloride    . sodium chloride 20 mL (04/23/18 1608)  . albumin human 250 mL (04/23/18 1748)  . amiodarone    . amiodarone 60 mg/hr (04/23/18 1846)   Followed by  . amiodarone    . cefUROXime (ZINACEF)  IV    . dexmedetomidine (PRECEDEX) IV infusion Stopped (04/23/18 1810)  . famotidine (PEPCID) IV 20 mg (04/23/18 1557)  . insulin (NOVOLIN-R) infusion 1.4 Units/hr (04/23/18 1900)  . lactated ringers    . lactated ringers    . lactated ringers 20 mL (04/23/18 1636)  . magnesium sulfate 4 g (04/23/18 1609)  . nitroGLYCERIN Stopped (04/23/18 1639)  . phenylephrine (NEO-SYNEPHRINE) Adult infusion 20 mcg/min (04/23/18 1620)  . potassium chloride 10 mEq (04/23/18 1842)  . vancomycin       Lab Results  Component Value Date   WBC 13.7 (H) 04/23/2018   HGB 11.9 (L) 04/23/2018   HCT 35.9 (L) 04/23/2018   PLT 150 04/23/2018   GLUCOSE 138 (H) 04/23/2018   CHOL 187 04/21/2018   TRIG 86 04/21/2018   HDL 45 04/21/2018     LDLCALC 125 (H) 04/21/2018   ALT 20 04/23/2018   AST 31 04/23/2018   NA 138 04/23/2018   K 3.8 04/23/2018   CL 100 04/23/2018   CREATININE 0.60 (L) 04/23/2018   BUN 9 04/23/2018   CO2 26 04/23/2018   INR 1.30 04/23/2018   HGBA1C 5.9 (H) 04/23/2018   Now extubated Not bleeding   Delight Ovens MD  Beeper [phone] Office [phone] 04/23/2018 7:02 PM

## 2018-04-24 ENCOUNTER — Inpatient Hospital Stay (HOSPITAL_COMMUNITY): Payer: 59

## 2018-04-24 DIAGNOSIS — Z951 Presence of aortocoronary bypass graft: Secondary | ICD-10-CM

## 2018-04-24 LAB — GLUCOSE, CAPILLARY
GLUCOSE-CAPILLARY: 113 mg/dL — AB (ref 70–99)
GLUCOSE-CAPILLARY: 114 mg/dL — AB (ref 70–99)
GLUCOSE-CAPILLARY: 128 mg/dL — AB (ref 70–99)
GLUCOSE-CAPILLARY: 145 mg/dL — AB (ref 70–99)
GLUCOSE-CAPILLARY: 83 mg/dL (ref 70–99)
GLUCOSE-CAPILLARY: 92 mg/dL (ref 70–99)
Glucose-Capillary: 163 mg/dL — ABNORMAL HIGH (ref 70–99)
Glucose-Capillary: 106 mg/dL — ABNORMAL HIGH (ref 70–99)
Glucose-Capillary: 117 mg/dL — ABNORMAL HIGH (ref 70–99)
Glucose-Capillary: 111 mg/dL — ABNORMAL HIGH (ref 70–99)
Glucose-Capillary: 128 mg/dL — ABNORMAL HIGH (ref 70–99)
Glucose-Capillary: 122 mg/dL — ABNORMAL HIGH (ref 70–99)
Glucose-Capillary: 104 mg/dL — ABNORMAL HIGH (ref 70–99)

## 2018-04-24 LAB — POCT I-STAT 3, ART BLOOD GAS (G3+)
ACID-BASE DEFICIT: 1 mmol/L (ref 0.0–2.0)
Acid-base deficit: 2 mmol/L (ref 0.0–2.0)
Acid-base deficit: 2 mmol/L (ref 0.0–2.0)
Acid-base deficit: 2 mmol/L (ref 0.0–2.0)
BICARBONATE: 25.2 mmol/L (ref 20.0–28.0)
BICARBONATE: 23.6 mmol/L (ref 20.0–28.0)
BICARBONATE: 23.6 mmol/L (ref 20.0–28.0)
BICARBONATE: 24.2 mmol/L (ref 20.0–28.0)
O2 Saturation: 93 %
O2 Saturation: 94 %
O2 Saturation: 97 %
O2 Saturation: 98 %
PCO2 ART: 49.5 mmHg — AB (ref 32.0–48.0)
PCO2 ART: 40.8 mmHg (ref 32.0–48.0)
PCO2 ART: 43.2 mmHg (ref 32.0–48.0)
PO2 ART: 69 mmHg — AB (ref 83.0–108.0)
PO2 ART: 103 mmHg (ref 83.0–108.0)
Patient temperature: 35.5
Patient temperature: 36.1
Patient temperature: 36.5
TCO2: 27 mmol/L (ref 22–32)
TCO2: 25 mmol/L (ref 22–32)
TCO2: 25 mmol/L (ref 22–32)
TCO2: 26 mmol/L (ref 22–32)
pCO2 arterial: 43 mmHg (ref 32.0–48.0)
pH, Arterial: 7.308 — ABNORMAL LOW (ref 7.350–7.450)
pH, Arterial: 7.367 (ref 7.350–7.450)
pH, Arterial: 7.343 — ABNORMAL LOW (ref 7.350–7.450)
pH, Arterial: 7.357 (ref 7.350–7.450)
pO2, Arterial: 70 mmHg — ABNORMAL LOW (ref 83.0–108.0)
pO2, Arterial: 99 mmHg (ref 83.0–108.0)

## 2018-04-24 LAB — POCT I-STAT, CHEM 8
BUN: 8 mg/dL (ref 6–20)
BUN: 13 mg/dL (ref 6–20)
CHLORIDE: 96 mmol/L — AB (ref 98–111)
CREATININE: 0.7 mg/dL (ref 0.61–1.24)
Calcium, Ion: 1.23 mmol/L (ref 1.15–1.40)
Calcium, Ion: 1.24 mmol/L (ref 1.15–1.40)
Chloride: 100 mmol/L (ref 98–111)
Creatinine, Ser: 0.9 mg/dL (ref 0.61–1.24)
GLUCOSE: 147 mg/dL — AB (ref 70–99)
GLUCOSE: 121 mg/dL — AB (ref 70–99)
HCT: 34 % — ABNORMAL LOW (ref 39.0–52.0)
HEMATOCRIT: 33 % — AB (ref 39.0–52.0)
Hemoglobin: 11.2 g/dL — ABNORMAL LOW (ref 13.0–17.0)
Hemoglobin: 11.6 g/dL — ABNORMAL LOW (ref 13.0–17.0)
POTASSIUM: 4.1 mmol/L (ref 3.5–5.1)
Potassium: 4.3 mmol/L (ref 3.5–5.1)
Sodium: 135 mmol/L (ref 135–145)
Sodium: 131 mmol/L — ABNORMAL LOW (ref 135–145)
TCO2: 22 mmol/L (ref 22–32)
TCO2: 23 mmol/L (ref 22–32)

## 2018-04-24 LAB — BASIC METABOLIC PANEL
Anion gap: 7 (ref 5–15)
BUN: 8 mg/dL (ref 6–20)
CO2: 23 mmol/L (ref 22–32)
Calcium: 8.4 mg/dL — ABNORMAL LOW (ref 8.9–10.3)
Chloride: 103 mmol/L (ref 98–111)
Creatinine, Ser: 0.9 mg/dL (ref 0.61–1.24)
GFR calc Af Amer: >60 mL/min (ref >60)
GFR calc non Af Amer: >60 mL/min (ref >60)
Glucose, Bld: 113 mg/dL — ABNORMAL HIGH (ref 70–99)
Potassium: 4 mmol/L (ref 3.5–5.1)
Sodium: 133 mmol/L — ABNORMAL LOW (ref 135–145)

## 2018-04-24 LAB — CBC
HCT: 35.7 % — ABNORMAL LOW (ref 39.0–52.0)
HCT: 35.4 % — ABNORMAL LOW (ref 39.0–52.0)
Hemoglobin: 12 g/dL — ABNORMAL LOW (ref 13.0–17.0)
Hemoglobin: 11.7 g/dL — ABNORMAL LOW (ref 13.0–17.0)
MCH: 30.1 pg (ref 26.0–34.0)
MCH: 30 pg (ref 26.0–34.0)
MCHC: 33.6 g/dL (ref 30.0–36.0)
MCHC: 33.1 g/dL (ref 30.0–36.0)
MCV: 89.5 fL (ref 78.0–100.0)
MCV: 90.8 fL (ref 78.0–100.0)
PLATELETS: 166 10*3/uL (ref 150–400)
Platelets: 187 10*3/uL (ref 150–400)
RBC: 3.99 MIL/uL — ABNORMAL LOW (ref 4.22–5.81)
RBC: 3.9 MIL/uL — AB (ref 4.22–5.81)
RDW: 13.2 % (ref 11.5–15.5)
RDW: 13.3 % (ref 11.5–15.5)
WBC: 16.1 10*3/uL — ABNORMAL HIGH (ref 4.0–10.5)
WBC: 13.9 10*3/uL — ABNORMAL HIGH (ref 4.0–10.5)

## 2018-04-24 LAB — MAGNESIUM
Magnesium: 2.2 mg/dL (ref 1.7–2.4)
Magnesium: 2.1 mg/dL (ref 1.7–2.4)

## 2018-04-24 LAB — CREATININE, SERUM
CREATININE: 1.02 mg/dL (ref 0.61–1.24)
GFR calc Af Amer: >60 mL/min (ref >60)
GFR calc non Af Amer: >60 mL/min (ref >60)

## 2018-04-24 LAB — POCT I-STAT 4, (NA,K, GLUC, HGB,HCT)
Glucose, Bld: 116 mg/dL — ABNORMAL HIGH (ref 70–99)
HCT: 33 % — ABNORMAL LOW (ref 39.0–52.0)
Hemoglobin: 11.2 g/dL — ABNORMAL LOW (ref 13.0–17.0)
Potassium: 3.8 mmol/L (ref 3.5–5.1)
SODIUM: 140 mmol/L (ref 135–145)

## 2018-04-24 MED ORDER — ENOXAPARIN SODIUM 30 MG/0.3ML ~~LOC~~ SOLN
30.0000 mg | Freq: Every day | SUBCUTANEOUS | Status: DC
  Administered 2018-04-24 – 2018-04-27 (×4): 30 mg via SUBCUTANEOUS
  Filled 2018-04-24 (×4): qty 0.3

## 2018-04-24 MED ORDER — INSULIN ASPART 100 UNIT/ML ~~LOC~~ SOLN
0.0000 [IU] | SUBCUTANEOUS | Status: DC
  Administered 2018-04-24 (×2): 2 [IU] via SUBCUTANEOUS
  Administered 2018-04-25: 4 [IU] via SUBCUTANEOUS

## 2018-04-24 MED ORDER — INSULIN ASPART 100 UNIT/ML ~~LOC~~ SOLN: 0.0000 [IU] | SUBCUTANEOUS | Status: DC

## 2018-04-24 MED ORDER — ORAL CARE MOUTH RINSE
15.0000 mL | Freq: Two times a day (BID) | OROMUCOSAL | Status: DC
  Administered 2018-04-24 – 2018-04-25 (×2): 15 mL via OROMUCOSAL

## 2018-04-24 NOTE — Progress Notes (Signed)
Patient ID: Zachary [name], male   DOB: 1961-07-03, 57 y.o.   MRN: [id number] EVENING ROUNDS NOTE :     [address]       [name] [address]             [phone]                 1 Day Post-Op Procedure(s) (LRB): CORONARY ARTERY BYPASS GRAFTING (CABG) x3 using the right greater saphenous vein harvested endoscopically and the left internal mammary artery. LIMA to LAD, SVG to Distal Circ, SVG to PD (N/A) TRANSESOPHAGEAL ECHOCARDIOGRAM (TEE) (N/A)  Total Length of Stay:  LOS: 4 days  BP 122/69   Pulse 77   Temp 99 F (37.2 C) (Oral)   Resp (!) 29   Ht 5\' 9"  (1.753 m)   Wt 214 lb 11.7 oz (97.4 kg)   SpO2 97%   BMI 31.71 kg/m   .Intake/Output      06/30 0701 - 07/01 0700   P.O. 480   I.V. (mL/kg) 257.1 (2.6)   IV Piggyback 0   Total Intake(mL/kg) 737.1 (7.6)   Urine (mL/kg/hr) 300 (0.3)   Blood    Chest Tube 50   Total Output 350   Net +387.1         . sodium chloride 20 mL/hr at 04/24/18 0800  . sodium chloride    . sodium chloride 20 mL (04/23/18 1608)  . amiodarone    . amiodarone Stopped (04/24/18 0900)  . cefUROXime (ZINACEF)  IV 1.5 g (04/24/18 1716)  . dexmedetomidine (PRECEDEX) IV infusion Stopped (04/23/18 1810)  . lactated ringers    . lactated ringers    . lactated ringers 20 mL/hr at 04/24/18 1500  . nitroGLYCERIN Stopped (04/23/18 1639)  . phenylephrine (NEO-SYNEPHRINE) Adult infusion Stopped (04/24/18 0900)     Lab Results  Component Value Date   WBC 13.9 (H) 04/24/2018   HGB 11.6 (L) 04/24/2018   HCT 34.0 (L) 04/24/2018   PLT 166 04/24/2018   GLUCOSE 121 (H) 04/24/2018   CHOL 187 04/21/2018   TRIG 86 04/21/2018   HDL 45 04/21/2018   LDLCALC 125 (H) 04/21/2018   ALT 20 04/23/2018   AST 31 04/23/2018   NA 131 (L) 04/24/2018   K 4.1 04/24/2018   CL 96 (L) 04/24/2018   CREATININE 0.90 04/24/2018   BUN 13 04/24/2018   CO2 23 04/24/2018   INR 1.30 04/23/2018   HGBA1C 5.9 (H) 04/23/2018   Stable day    Delight Ovens MD  Beeper [phone] Office [phone] 04/24/2018 7:05 PM

## 2018-04-24 NOTE — Progress Notes (Signed)
Progress Note  Patient Name: Zachary [name] Date of Encounter: 04/24/2018  Primary Cardiologist: Dr Jens Som  Subjective   Chest "sore"; no dyspnea  Inpatient Medications    Scheduled Meds: . acetaminophen  1,000 mg Oral Q6H   Or  . acetaminophen (TYLENOL) oral liquid 160 mg/5 mL  1,000 mg Per Tube Q6H  . amiodarone  400 mg Oral Q12H   Followed by  . [START ON 05/01/2018] amiodarone  400 mg Oral Daily  . aspirin EC  325 mg Oral Daily   Or  . aspirin  324 mg Per Tube Daily  . atorvastatin  80 mg Oral q1800  . bisacodyl  10 mg Oral Daily   Or  . bisacodyl  10 mg Rectal Daily  . docusate sodium  200 mg Oral Daily  . insulin regular  0-10 Units Intravenous TID WC  . metoprolol tartrate  12.5 mg Oral BID   Or  . metoprolol tartrate  12.5 mg Per Tube BID  . [START ON 04/25/2018] pantoprazole  40 mg Oral Daily  . sodium chloride flush  3 mL Intravenous Q12H   Continuous Infusions: . sodium chloride 20 mL (04/23/18 1555)  . sodium chloride    . sodium chloride 20 mL (04/23/18 1608)  . albumin human Stopped (04/23/18 2052)  . amiodarone    . amiodarone 30 mg/hr (04/24/18 0409)  . cefUROXime (ZINACEF)  IV 1.5 g (04/24/18 0503)  . dexmedetomidine (PRECEDEX) IV infusion Stopped (04/23/18 1810)  . insulin (NOVOLIN-R) infusion 1.7 Units/hr (04/24/18 6004)  . lactated ringers    . lactated ringers    . lactated ringers 20 mL/hr at 04/24/18 0445  . nitroGLYCERIN Stopped (04/23/18 1639)  . phenylephrine (NEO-SYNEPHRINE) Adult infusion 20 mcg/min (04/24/18 0515)   PRN Meds: sodium chloride, albumin human, lactated ringers, metoprolol tartrate, midazolam, morphine injection, ondansetron (ZOFRAN) IV, oxyCODONE, sodium chloride flush, temazepam, traMADol   Vital Signs    Vitals:   04/24/18 0415 04/24/18 0430 04/24/18 0445 04/24/18 0545  BP:  101/66    Pulse: 79 72 73   Resp: 17 18 18    Temp: 98.4 F (36.9 C) 98.6 F (37 C) 98.6 F (37 C)   TempSrc:      SpO2: 97% 96% 96%     Weight:    214 lb 11.7 oz (97.4 kg)  Height:        Intake/Output Summary (Last 24 hours) at 04/24/2018 0717 Last data filed at 04/24/2018 0558 Gross per 24 hour  Intake 7164.3 ml  Output 4855 ml  Net 2309.3 ml   Filed Weights   04/22/18 0540 04/23/18 0629 04/24/18 0545  Weight: 200 lb 2.8 oz (90.8 kg) 192 lb 7.4 oz (87.3 kg) 214 lb 11.7 oz (97.4 kg)    Telemetry    Sinus- Personally Reviewed  Physical Exam   GEN: WD WN NAD Neck: supple Cardiac: RRR, no murmur Respiratory: CTA; no wheeze; s/p sternotomy GI: Soft, NT, ND, no masses MS: trace edema Neuro:  No focal findings   Labs    Chemistry Recent Labs  Lab 04/22/18 0444 04/23/18 0239 04/23/18 0923  04/23/18 1413 04/23/18 1441 04/23/18 1555 04/23/18 2250 04/23/18 2252  NA 140 137 135   < > 135 138 140 135  --   K 3.7 4.4 4.1   < > 4.8 3.8 3.8 4.3  --   CL 108 102 99   < > 99 100  --  100  --   CO2 26 25 26   --   --   --   --   --   --  GLUCOSE 108* 130* 132*   < > 146* 138* 116* 147*  --   BUN 11 11 11    < > 11 9  --  8  --   CREATININE 0.88 0.92 0.87   < > 0.70 0.60*  --  0.70 0.81  CALCIUM 7.9* 9.0 9.0  --   --   --   --   --   --   PROT  --   --  6.4*  --   --   --   --   --   --   ALBUMIN  --   --  3.7  --   --   --   --   --   --   AST  --   --  31  --   --   --   --   --   --   ALT  --   --  20  --   --   --   --   --   --   ALKPHOS  --   --  53  --   --   --   --   --   --   BILITOT  --   --  0.9  --   --   --   --   --   --   GFRNONAA >60 >60 >60  --   --   --   --   --  >60  GFRAA >60 >60 >60  --   --   --   --   --  >60  ANIONGAP 6 10 10   --   --   --   --   --   --    < > = values in this interval not displayed.     Hematology Recent Labs  Lab 04/23/18 0239  04/23/18 1340  04/23/18 1553 04/23/18 1555 04/23/18 2250 04/23/18 2252  WBC 12.3*  --   --   --  13.7*  --   --  14.2*  RBC 4.73  --   --   --  3.98*  --   --  3.84*  HGB 14.2   < > 9.6*   < > 11.9* 11.2* 11.2* 11.6*  HCT  42.6   < > 28.3*   < > 35.9* 33.0* 33.0* 34.9*  MCV 90.1  --   --   --  90.2  --   --  90.9  MCH 30.0  --   --   --  29.9  --   --  30.2  MCHC 33.3  --   --   --  33.1  --   --  33.2  RDW 12.9  --   --   --  12.8  --   --  13.2  PLT 289  --  207  --  150  --   --  180   < > = values in this interval not displayed.    Cardiac Enzymes Recent Labs  Lab 04/21/18 0235 04/21/18 0755 04/21/18 1438 04/23/18 0822  TROPONINI 1.44* 1.46* 1.02* 1.57*    Radiology    Dg Chest Port 1 View  Result Date: 04/23/2018 CLINICAL DATA:  Status post coronary bypass grafting EXAM: PORTABLE CHEST 1 VIEW COMPARISON:  04/20/2018 FINDINGS: Postsurgical changes are now seen. Swan-Ganz catheter, endotracheal tube and nasogastric catheter are noted in satisfactory position. Left thoracostomy tube and apparent mediastinal drain are seen. No pneumothorax is  noted. Patchy atelectatic changes are noted bilaterally slightly worse on the left than the right. No sizable effusion is seen. No acute bony abnormality is noted. IMPRESSION: Patchy bilateral atelectasis. Postoperative changes with tubes and lines as described above. Electronically Signed   By: Alcide Clever M.D.   On: 04/23/2018 16:54   Patient Profile     57 y.o. male status post non-ST elevation myocardial infarction.  Cardiac catheterization reveals three-vessel coronary artery disease.  Echocardiogram shows normal LV function, mild to moderate mitral regurgitation and biatrial enlargement.  Patient now status post coronary artery bypass and graft performed emergently because of recurrent pain.  Assessment & Plan    1 coronary artery disease status post coronary artery bypass graft-patient doing well following emergent CABG yesterday.  Plan to continue aspirin, beta-blocker and statin.    2 Tobacco abuse-patient previously counseled on discontinuing.    3 hyperlipidemia-Continue Lipitor.  Check lipids and liver in 4 weeks.  4 postoperative volume  excess-needs gentle diuresis.  For questions or updates, please contact [facility] Please consult [email or web address] for contact info under Cardiology/STEMI.      Signed, Olga Millers, MD  04/24/2018, 7:17 AM

## 2018-04-24 NOTE — Anesthesia Postprocedure Evaluation (Signed)
Anesthesia Post Note  Patient: Kyaw Zarn  Procedure(s) Performed: CORONARY ARTERY BYPASS GRAFTING (CABG) x3 using the right greater saphenous vein harvested endoscopically and the left internal mammary artery. LIMA to LAD, SVG to Distal Circ, SVG to PD (N/A Chest) TRANSESOPHAGEAL ECHOCARDIOGRAM (TEE) (N/A )     Patient location during evaluation: SICU Anesthesia Type: General Level of consciousness: sedated Pain management: pain level controlled Vital Signs Assessment: post-procedure vital signs reviewed and stable Respiratory status: patient remains intubated per anesthesia plan Cardiovascular status: stable Postop Assessment: no apparent nausea or vomiting Anesthetic complications: no    Last Vitals:  Vitals:   04/24/18 0745 04/24/18 0800  BP:  108/71  Pulse: 89 83  Resp: (!) 23 (!) 21  Temp: 37.1 C 37 C  SpO2: 93% 93%    Last Pain:  Vitals:   04/24/18 0700  TempSrc: Core (Comment)  PainSc:                  Kendalyn Cranfield

## 2018-04-24 NOTE — Progress Notes (Signed)
Patient ID: Zachary [name], male   DOB: June 27, 1961, 57 y.o.   MRN: [id number] [facility] DAILY ICU PROGRESS NOTE                   [address]            [name] [address]          [phone]   1 Day Post-Op Procedure(s) (LRB): CORONARY ARTERY BYPASS GRAFTING (CABG) x3 using the right greater saphenous vein harvested endoscopically and the left internal mammary artery. LIMA to LAD, SVG to Distal Circ, SVG to PD (N/A) TRANSESOPHAGEAL ECHOCARDIOGRAM (TEE) (N/A)  Total Length of Stay:  LOS: 4 days   Subjective: Patient awake and alert neurologically intact this morning.  Feels well  Objective: Vital signs in last 24 hours: Temp:  [95.7 F (35.4 C)-99.1 F (37.3 C)] 98.6 F (37 C) (06/30 0800) Pulse Rate:  [48-89] 83 (06/30 0800) Cardiac Rhythm: Normal sinus rhythm (06/30 0200) Resp:  [12-32] 21 (06/30 0800) BP: (85-108)/(55-71) 108/71 (06/30 0800) SpO2:  [83 %-100 %] 93 % (06/30 0800) Arterial Line BP: (85-154)/(50-85) 128/60 (06/30 0800) FiO2 (%):  [40 %-50 %] 40 % (06/29 1805) Weight:  [214 lb 11.7 oz (97.4 kg)] 214 lb 11.7 oz (97.4 kg) (06/30 0545)  Filed Weights   04/22/18 0540 04/23/18 0629 04/24/18 0545  Weight: 200 lb 2.8 oz (90.8 kg) 192 lb 7.4 oz (87.3 kg) 214 lb 11.7 oz (97.4 kg)    Weight change: 22 lb 4.3 oz (10.1 kg)   Hemodynamic parameters for last 24 hours: PAP: (26-55)/(13-31) 51/24 CO:  [5 L/min-6.4 L/min] 5 L/min CI:  [2.5 L/min/m2-3.2 L/min/m2] 2.5 L/min/m2  Intake/Output from previous day: 06/29 0701 - 06/30 0700 In: 7348.8 [P.O.:1180; I.V.:4722; IV Piggyback:1446.8] Out: 4855 [Urine:3211; Blood:1100; Chest Tube:544]  Intake/Output this shift: Total I/O In: 317.1 [P.O.:240; I.V.:77.1] Out: 100 [Urine:50; Chest Tube:50]  Current Meds: Scheduled Meds: . acetaminophen  1,000 mg Oral Q6H   Or  . acetaminophen (TYLENOL) oral liquid 160 mg/5 mL  1,000 mg Per Tube Q6H  . amiodarone  400 mg Oral Q12H   Followed by  . [START ON  05/01/2018] amiodarone  400 mg Oral Daily  . aspirin EC  325 mg Oral Daily   Or  . aspirin  324 mg Per Tube Daily  . atorvastatin  80 mg Oral q1800  . bisacodyl  10 mg Oral Daily   Or  . bisacodyl  10 mg Rectal Daily  . docusate sodium  200 mg Oral Daily  . insulin regular  0-10 Units Intravenous TID WC  . metoprolol tartrate  12.5 mg Oral BID   Or  . metoprolol tartrate  12.5 mg Per Tube BID  . [START ON 04/25/2018] pantoprazole  40 mg Oral Daily  . sodium chloride flush  3 mL Intravenous Q12H   Continuous Infusions: . sodium chloride 20 mL/hr at 04/24/18 0800  . sodium chloride    . sodium chloride 20 mL (04/23/18 1608)  . albumin human Stopped (04/23/18 2052)  . amiodarone    . amiodarone 30 mg/hr (04/24/18 0409)  . cefUROXime (ZINACEF)  IV 1.5 g (04/24/18 0503)  . dexmedetomidine (PRECEDEX) IV infusion Stopped (04/23/18 1810)  . insulin (NOVOLIN-R) infusion 1.5 Units/hr (04/24/18 0800)  . lactated ringers    . lactated ringers    . lactated ringers 20 mL/hr at 04/24/18 0800  . nitroGLYCERIN Stopped (04/23/18 1639)  . phenylephrine (NEO-SYNEPHRINE) Adult infusion 10 mcg/min (04/24/18 0800)  PRN Meds:.sodium chloride, albumin human, lactated ringers, metoprolol tartrate, midazolam, morphine injection, ondansetron (ZOFRAN) IV, oxyCODONE, sodium chloride flush, temazepam, traMADol  General appearance: alert, cooperative and no distress Neurologic: intact Heart: regular rate and rhythm, S1, S2 normal, no murmur, click, rub or gallop Lungs: diminished breath sounds bibasilar Abdomen: soft, non-tender; bowel sounds normal; no masses,  no organomegaly Extremities: extremities normal, atraumatic, no cyanosis or edema and Homans sign is negative, no sign of DVT Wound: Sternum stable  Lab Results: CBC: Recent Labs    04/23/18 1553  04/23/18 2250 04/23/18 2252  WBC 13.7*  --   --  14.2*  HGB 11.9*   < > 11.2* 11.6*  HCT 35.9*   < > 33.0* 34.9*  PLT 150  --   --  180   < > =  values in this interval not displayed.   BMET:  Recent Labs    04/23/18 0239 04/23/18 0923  04/23/18 1441 04/23/18 1555 04/23/18 2250 04/23/18 2252  NA 137 135   < > 138 140 135  --   K 4.4 4.1   < > 3.8 3.8 4.3  --   CL 102 99   < > 100  --  100  --   CO2 25 26  --   --   --   --   --   GLUCOSE 130* 132*   < > 138* 116* 147*  --   BUN 11 11   < > 9  --  8  --   CREATININE 0.92 0.87   < > 0.60*  --  0.70 0.81  CALCIUM 9.0 9.0  --   --   --   --   --    < > = values in this interval not displayed.    CMET: Lab Results  Component Value Date   WBC 14.2 (H) 04/23/2018   HGB 11.6 (L) 04/23/2018   HCT 34.9 (L) 04/23/2018   PLT 180 04/23/2018   GLUCOSE 147 (H) 04/23/2018   CHOL 187 04/21/2018   TRIG 86 04/21/2018   HDL 45 04/21/2018   LDLCALC 125 (H) 04/21/2018   ALT 20 04/23/2018   AST 31 04/23/2018   NA 135 04/23/2018   K 4.3 04/23/2018   CL 100 04/23/2018   CREATININE 0.81 04/23/2018   BUN 8 04/23/2018   CO2 26 04/23/2018   INR 1.30 04/23/2018   HGBA1C 5.9 (H) 04/23/2018      PT/INR:  Recent Labs    04/23/18 1553  LABPROT 16.1*  INR 1.30   Radiology: Dg Chest Port 1 View  Result Date: 04/23/2018 CLINICAL DATA:  Status post coronary bypass grafting EXAM: PORTABLE CHEST 1 VIEW COMPARISON:  04/20/2018 FINDINGS: Postsurgical changes are now seen. Swan-Ganz catheter, endotracheal tube and nasogastric catheter are noted in satisfactory position. Left thoracostomy tube and apparent mediastinal drain are seen. No pneumothorax is noted. Patchy atelectatic changes are noted bilaterally slightly worse on the left than the right. No sizable effusion is seen. No acute bony abnormality is noted. IMPRESSION: Patchy bilateral atelectasis. Postoperative changes with tubes and lines as described above. Electronically Signed   By: Alcide Clever M.D.   On: 04/23/2018 16:54     Assessment/Plan: S/P Procedure(s) (LRB): CORONARY ARTERY BYPASS GRAFTING (CABG) x3 using the right  greater saphenous vein harvested endoscopically and the left internal mammary artery. LIMA to LAD, SVG to Distal Circ, SVG to PD (N/A) TRANSESOPHAGEAL ECHOCARDIOGRAM (TEE) (N/A) Mobilize Diuresis See progression orders Expected Acute  Blood - loss Anemia- continue to monitor  DC lines and tubes    Delight Ovens 04/24/2018 8:16 AM

## 2018-04-25 ENCOUNTER — Inpatient Hospital Stay (HOSPITAL_COMMUNITY): Payer: 59

## 2018-04-25 ENCOUNTER — Encounter (HOSPITAL_COMMUNITY)
Admission: EM | Disposition: A | Discharge status: Home / Self Care | Payer: Self-pay | Source: Home / Self Care | Attending: Cardiothoracic Surgery

## 2018-04-25 ENCOUNTER — Encounter (HOSPITAL_COMMUNITY): Payer: Self-pay | Admitting: Cardiothoracic Surgery

## 2018-04-25 LAB — GLUCOSE, CAPILLARY
GLUCOSE-CAPILLARY: 166 mg/dL — AB (ref 70–99)
GLUCOSE-CAPILLARY: 112 mg/dL — AB (ref 70–99)
Glucose-Capillary: 114 mg/dL — ABNORMAL HIGH (ref 70–99)
Glucose-Capillary: 118 mg/dL — ABNORMAL HIGH (ref 70–99)
Glucose-Capillary: 115 mg/dL — ABNORMAL HIGH (ref 70–99)

## 2018-04-25 LAB — CBC
HCT: 32.1 % — ABNORMAL LOW (ref 39.0–52.0)
Hemoglobin: 10.6 g/dL — ABNORMAL LOW (ref 13.0–17.0)
MCH: 29.9 pg (ref 26.0–34.0)
MCHC: 33 g/dL (ref 30.0–36.0)
MCV: 90.7 fL (ref 78.0–100.0)
Platelets: 158 10*3/uL (ref 150–400)
RBC: 3.54 MIL/uL — ABNORMAL LOW (ref 4.22–5.81)
RDW: 13.3 % (ref 11.5–15.5)
WBC: 12.3 10*3/uL — ABNORMAL HIGH (ref 4.0–10.5)

## 2018-04-25 LAB — ECHO TEE
AV Mean grad: 3 mmHg
HEIGHTINCHES: 69 in
LVOTD: 23 mm
Mean grad: 0 mmHg
STJ: 2.8 cm
Sinus: 3.3 cm
WEIGHTICAEL: 3079.39 [oz_av]

## 2018-04-25 LAB — BASIC METABOLIC PANEL
Anion gap: 9 (ref 5–15)
BUN: 10 mg/dL (ref 6–20)
CO2: 24 mmol/L (ref 22–32)
Calcium: 8.5 mg/dL — ABNORMAL LOW (ref 8.9–10.3)
Chloride: 100 mmol/L (ref 98–111)
Creatinine, Ser: 0.88 mg/dL (ref 0.61–1.24)
GFR calc Af Amer: >60 mL/min (ref >60)
GFR calc non Af Amer: >60 mL/min (ref >60)
Glucose, Bld: 116 mg/dL — ABNORMAL HIGH (ref 70–99)
Potassium: 4.1 mmol/L (ref 3.5–5.1)
Sodium: 133 mmol/L — ABNORMAL LOW (ref 135–145)

## 2018-04-25 SURGERY — CORONARY ARTERY BYPASS GRAFTING (CABG)
Anesthesia: General | Site: Chest

## 2018-04-25 MED ORDER — ONDANSETRON HCL 4 MG PO TABS: 4.0000 mg | ORAL_TABLET | Freq: Four times a day (QID) | ORAL | Status: DC | PRN

## 2018-04-25 MED ORDER — ALUM & MAG HYDROXIDE-SIMETH 200-200-20 MG/5ML PO SUSP: 15.0000 mL | ORAL | Status: DC | PRN

## 2018-04-25 MED ORDER — DIPHENHYDRAMINE HCL 25 MG PO CAPS: 25.0000 mg | ORAL_CAPSULE | Freq: Every evening | ORAL | Status: DC | PRN

## 2018-04-25 MED ORDER — SODIUM CHLORIDE 0.9% FLUSH: 3.0000 mL | INTRAVENOUS | Status: DC | PRN

## 2018-04-25 MED ORDER — BISACODYL 5 MG PO TBEC: 10.0000 mg | DELAYED_RELEASE_TABLET | Freq: Every day | ORAL | Status: DC | PRN

## 2018-04-25 MED ORDER — INSULIN ASPART 100 UNIT/ML ~~LOC~~ SOLN
0.0000 [IU] | Freq: Three times a day (TID) | SUBCUTANEOUS | Status: DC
  Administered 2018-04-26 – 2018-04-27 (×3): 2 [IU] via SUBCUTANEOUS

## 2018-04-25 MED ORDER — ACETAMINOPHEN 325 MG PO TABS
650.0000 mg | ORAL_TABLET | Freq: Four times a day (QID) | ORAL | Status: DC | PRN
  Administered 2018-04-26 – 2018-04-27 (×3): 650 mg via ORAL
  Filled 2018-04-25 (×3): qty 2

## 2018-04-25 MED ORDER — METOPROLOL TARTRATE 12.5 MG HALF TABLET
12.5000 mg | ORAL_TABLET | Freq: Two times a day (BID) | ORAL | Status: DC
  Administered 2018-04-25 – 2018-04-28 (×6): 12.5 mg via ORAL
  Filled 2018-04-25 (×6): qty 1

## 2018-04-25 MED ORDER — PANTOPRAZOLE SODIUM 40 MG PO TBEC
40.0000 mg | DELAYED_RELEASE_TABLET | Freq: Every day | ORAL | Status: DC
  Administered 2018-04-26 – 2018-04-28 (×3): 40 mg via ORAL
  Filled 2018-04-25 (×3): qty 1

## 2018-04-25 MED ORDER — SODIUM CHLORIDE 0.9 % IV SOLN: 250.0000 mL | INTRAVENOUS | Status: DC | PRN

## 2018-04-25 MED ORDER — MOVING RIGHT ALONG BOOK
Freq: Once | Status: AC
  Administered 2018-04-25: 19:00:00
  Filled 2018-04-25: qty 1

## 2018-04-25 MED ORDER — MAGNESIUM HYDROXIDE 400 MG/5ML PO SUSP: 30.0000 mL | Freq: Every day | ORAL | Status: DC | PRN

## 2018-04-25 MED ORDER — ASPIRIN EC 325 MG PO TBEC
325.0000 mg | DELAYED_RELEASE_TABLET | Freq: Every day | ORAL | Status: DC
  Administered 2018-04-26 – 2018-04-28 (×3): 325 mg via ORAL
  Filled 2018-04-25 (×3): qty 1

## 2018-04-25 MED ORDER — TRAMADOL HCL 50 MG PO TABS: 50.0000 mg | ORAL_TABLET | ORAL | Status: DC | PRN

## 2018-04-25 MED ORDER — OXYCODONE HCL 5 MG PO TABS
5.0000 mg | ORAL_TABLET | ORAL | Status: DC | PRN
  Administered 2018-04-27 – 2018-04-28 (×2): 5 mg via ORAL
  Filled 2018-04-25 (×2): qty 1

## 2018-04-25 MED ORDER — DOCUSATE SODIUM 100 MG PO CAPS
200.0000 mg | ORAL_CAPSULE | Freq: Every day | ORAL | Status: DC
  Administered 2018-04-26 – 2018-04-28 (×2): 200 mg via ORAL
  Filled 2018-04-25 (×2): qty 2

## 2018-04-25 MED ORDER — SODIUM CHLORIDE 0.9% FLUSH
3.0000 mL | Freq: Two times a day (BID) | INTRAVENOUS | Status: DC
  Administered 2018-04-25 – 2018-04-28 (×6): 3 mL via INTRAVENOUS

## 2018-04-25 MED ORDER — BISACODYL 10 MG RE SUPP: 10.0000 mg | Freq: Every day | RECTAL | Status: DC | PRN

## 2018-04-25 MED ORDER — ONDANSETRON HCL 4 MG/2ML IJ SOLN
4.0000 mg | Freq: Four times a day (QID) | INTRAMUSCULAR | Status: DC | PRN
Start: 2018-04-25 — End: 2018-04-28

## 2018-04-25 NOTE — Progress Notes (Addendum)
[facility] BRIEF SICU PROGRESS NOTE  2 Days Post-Op  S/P Procedure(s) (LRB): CORONARY ARTERY BYPASS GRAFTING (CABG) x3 using the right greater saphenous vein harvested endoscopically and the left internal mammary artery. LIMA to LAD, SVG to Distal Circ, SVG to PD (N/A) TRANSESOPHAGEAL ECHOCARDIOGRAM (TEE) (N/A)   Stable day NSR w/ stable BP Breathing comfortably w/ O2 sats 95-100% UOP adequate  Plan: Awaiting bed on 4E for transfer. Continue current plan  Purcell Nails, MD 04/25/2018 6:57 PM

## 2018-04-25 NOTE — Care Management Note (Addendum)
Case Management Note Donn Pierini RN,BSN [facility]22 Case Manager  [phone]  Patient Details  Name: Zachary [name] MRN: [id number] Date of Birth: [birth year]  Subjective/Objective: Pt admitted with NSTEMI,  s/p CABG 04/23/18                 Action/Plan: PTA pt lived at home with wife, independent, anticipate return home post op. CM to follow for transition of care needs.   Expected Discharge Date:  04/22/18               Expected Discharge Plan:  Home/Self Care(ndependent from home with wife, has PCP and denied barriers with obtaining/paying for medications)  In-House Referral:     Discharge planning Services  CM Consult  Post Acute Care Choice:    Choice offered to:     DME Arranged:    DME Agency:     HH Arranged:    HH Agency:     Status of Service:  In process, will continue to follow  If discussed at Long Length of Stay Meetings, dates discussed:    Discharge Disposition:   Additional Comments:  Darrold Span, RN 04/25/2018, 10:54 AM

## 2018-04-25 NOTE — Op Note (Signed)
NAMEELISON, [name] ACCOUNT [id number] DATE OF BIRTH:Sep 09, 1961 FACILITY: [facility] LOCATION: [facility] PHYSICIAN:Zachary Urieta Bari [name], MD  OPERATIVE REPORT  DATE OF PROCEDURE:  04/23/2018  PREOPERATIVE DIAGNOSIS:  Emergency coronary artery bypass grafting with recent subendocardial myocardial infarction.  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  SURGICAL PROCEDURE:  Emergency coronary artery bypass grafting x3 with left internal mammary to the left anterior descending coronary artery, reverse saphenous vein graft to the posterior descending coronary artery, reverse saphenous vein graft to the  distal circumflex coronary artery with right thigh greater saphenous vein harvesting.  SURGEON:  Sheliah Plane, MD  FIRST ASSISTANT:  Zachary Crane, PA-C   BRIEF HISTORY:  The patient is a 56 year old male who 3 days prior to surgery was admitted to the cardiology service with new onset of unstable chest pain and positive enzymes consistent with nonstemi MI.  He underwent cardiac catheterization.  A referral for  cardiac surgery was delayed as angioplasty options were considered with cardiology.  On the 28th, a cardiology consult was requested.  On the morning of surgery, the patient again after Angiomax had been stopped began having substernal chest pain.   Because of his critical anatomy, positive enzymes, ongoing chest pain, and mild ST changes consistent with ischemia, we recommended to the patient that we proceed with emergency coronary artery bypass grafting.  The patient agreed and signed informed  consent.  DESCRIPTION OF PROCEDURE:  The patient was taken directly to the operating room where Swan-Ganz and arterial line monitors were placed.  The patient underwent general endotracheal anesthesia without incident.  He remained hemodynamically stable.  Dr.  Maple [name] placed a TEE probe.  The overall  ventricular function appeared preserved.  The skin of the chest and legs was prepped with Betadine and draped in a sterile manner.  Appropriate timeout was performed, and we proceeded with right vein greater  saphenous harvesting from the right thigh.  The vein was of excellent quality and caliber.  Median sternotomy was performed.  The left internal mammary artery was dissected down and the distal artery was divided.  Good free flow of the vessel was  hydrostatically dilated.  Pericardium was opened.  Overall, ventricular function appeared preserved.  The patient was systemically heparinized.  The ascending aorta was cannulated.  The right atrium was cannulated.  Aortic root vent and cardioplegia  needle was introduced into the ascending aorta.  The patient was placed on cardiopulmonary bypass 2.4 L/min per sq m.  Sites of anastomoses were selected and dissected out of the epicardium.  The patient spiked temperature and cooled to 32 degrees.   Aortic crossclamp was applied.  Cold blood potassium cardioplegia 500 mL was administered with diastolic arrest of the heart.  Myocardial septal temperature was monitored throughout the cross-clamp.  Attention was turned first to the distal right  coronary artery at the takeoff of the posterior descending.  The vessel was opened and easily admitted a 1.5 mm probe proximally and distally and down the continuation branch.  Using a running 7-0 Prolene, distal anastomosis was performed.  Attention was  then turned to the distal circumflex branch which was a somewhat small vessel compared to the size on the arteriogram.  The vessel was opened and admitted a 1.5 mm probe.  Using a running 7-0 Prolene, distal anastomosis was performed.  Attention was  then turned to the left anterior descending coronary artery in the midportion.  The vessel was opened.  It was  a good quality vessel.  It admitted a 1.5 mm probe easily.  Proximally and distally using a running 8-0 Prolene, a  left internal mammary artery  was anastomosed to left anterior descending coronary artery.  With the cross-clamp still in place, 2 punch aortotomies were performed, and each of the 2 vein grafts were anastomosed to the ascending aorta.  The bulldog was removed from the mammary  artery with proper rise in myocardial septal temperature.  The heart was allowed to passively fill and deair.  The proximal anastomoses were completed, and the aortic cross-clamp was removed with total cross-clamp time of 79 minutes.  The patient  spontaneously converted to a sinus rhythm.  Sites of anastomoses were inspected and were free of bleeding.  He was then ventilated and weaned from cardiopulmonary bypass without difficulty.  He remained hemodynamically stable, and he was decannulated in  the usual fashion.  Protamine sulfate was administered.  With the operative field hemostatic, atrial and _____ graft markers were applied.  A left pleural tube and a Blake mediastinal drain was left in place.  Pericardium was loosely reapproximated.   Sternum was closed with #6 stainless steel wire.  Fascia closed with interrupted 0 Vicryl, running 3-0 Vicryl in the subcutaneous tissue, 4-0 subcuticular stitch in the skin edges.  Sponge and needle counts were reported as correct at completion of the  procedure.  The patient tolerated the procedure without obvious complication.  Total pump time was 106 minutes.  Zachary Crane, PA-C, assisted during the procedure with cannulation harvesting of vein, exposure, and construction of the anastomosis.  LN/NUANCE  D:04/24/2018 T:04/25/2018 JOB:[id number]

## 2018-04-25 NOTE — Progress Notes (Signed)
Patient ID: Zachary [name], male   DOB: [birth year], 59 y.o.   MRN: [id number] TCTS DAILY ICU PROGRESS NOTE                   [address]            [name] [address]          [phone]   2 Days Post-Op Procedure(s) (LRB): CORONARY ARTERY BYPASS GRAFTING (CABG) x3 using the right greater saphenous vein harvested endoscopically and the left internal mammary artery. LIMA to LAD, SVG to Distal Circ, SVG to PD (N/A) TRANSESOPHAGEAL ECHOCARDIOGRAM (TEE) (N/A)  Total Length of Stay:  LOS: 5 days   Subjective: Patient awake alert neurologically intact, walked around the unit this morning  Objective: Vital signs in last 24 hours: Temp:  [97.8 F (36.6 C)-99 F (37.2 C)] 98.7 F (37.1 C) (07/01 0350) Pulse Rate:  [65-89] 71 (07/01 0400) Cardiac Rhythm: Normal sinus rhythm (07/01 0400) Resp:  [14-29] 18 (07/01 0400) BP: (87-122)/(62-74) 108/69 (07/01 0400) SpO2:  [90 %-100 %] 93 % (07/01 0400) Arterial Line BP: (89-154)/(60-85) 124/61 (06/30 0900) Weight:  [212 lb 4.9 oz (96.3 kg)] 212 lb 4.9 oz (96.3 kg) (07/01 0600)  Filed Weights   04/23/18 0629 04/24/18 0545 04/25/18 0600  Weight: 192 lb 7.4 oz (87.3 kg) 214 lb 11.7 oz (97.4 kg) 212 lb 4.9 oz (96.3 kg)    Weight change: -2 lb 6.8 oz (-1.1 kg)   Hemodynamic parameters for last 24 hours: PAP: (51-55)/(23-31) 51/24  Intake/Output from previous day: 06/30 0701 - 07/01 0700 In: 900.1 [P.O.:480; I.V.:320.1; IV Piggyback:100] Out: 1225 [Urine:1175; Chest Tube:50]  Intake/Output this shift: No intake/output data recorded.  Current Meds: Scheduled Meds: . acetaminophen  1,000 mg Oral Q6H   Or  . acetaminophen (TYLENOL) oral liquid 160 mg/5 mL  1,000 mg Per Tube Q6H  . amiodarone  400 mg Oral Q12H   Followed by  . [START ON 05/01/2018] amiodarone  400 mg Oral Daily  . aspirin EC  325 mg Oral Daily   Or  . aspirin  324 mg Per Tube Daily  . atorvastatin  80 mg Oral q1800  . bisacodyl  10 mg Oral Daily   Or    . bisacodyl  10 mg Rectal Daily  . docusate sodium  200 mg Oral Daily  . enoxaparin (LOVENOX) injection  30 mg Subcutaneous QHS  . insulin aspart  0-24 Units Subcutaneous Q4H  . mouth rinse  15 mL Mouth Rinse BID  . metoprolol tartrate  12.5 mg Oral BID   Or  . metoprolol tartrate  12.5 mg Per Tube BID  . pantoprazole  40 mg Oral Daily  . sodium chloride flush  3 mL Intravenous Q12H   Continuous Infusions: . sodium chloride 20 mL/hr at 04/24/18 0800  . sodium chloride    . sodium chloride 20 mL (04/23/18 1608)  . amiodarone    . amiodarone Stopped (04/24/18 0900)  . cefUROXime (ZINACEF)  IV 1.5 g (04/24/18 1716)  . dexmedetomidine (PRECEDEX) IV infusion Stopped (04/23/18 1810)  . lactated ringers    . lactated ringers    . lactated ringers 20 mL/hr at 04/24/18 1500  . nitroGLYCERIN Stopped (04/23/18 1639)  . phenylephrine (NEO-SYNEPHRINE) Adult infusion Stopped (04/24/18 0900)   PRN Meds:.sodium chloride, lactated ringers, metoprolol tartrate, midazolam, morphine injection, ondansetron (ZOFRAN) IV, oxyCODONE, sodium chloride flush, temazepam, traMADol  General appearance: alert and cooperative Neurologic: intact Heart: regular rate and rhythm,  S1, S2 normal, no murmur, click, rub or gallop Lungs: clear to auscultation bilaterally Abdomen: soft, non-tender; bowel sounds normal; no masses,  no organomegaly Extremities: extremities normal, atraumatic, no cyanosis or edema and Homans sign is negative, no sign of DVT Wound: Sternum stable  Lab Results: CBC: Recent Labs    04/24/18 0821 04/24/18 1629 04/24/18 1638  WBC 16.1* 13.9*  --   HGB 12.0* 11.7* 11.6*  HCT 35.7* 35.4* 34.0*  PLT 187 166  --    BMET:  Recent Labs    04/23/18 0923  04/24/18 0821 04/24/18 1629 04/24/18 1638  NA 135   < > 133*  --  131*  K 4.1   < > 4.0  --  4.1  CL 99   < > 103  --  96*  CO2 26  --  23  --   --   GLUCOSE 132*   < > 113*  --  121*  BUN 11   < > 8  --  13  CREATININE 0.87   <  > 0.90 1.02 0.90  CALCIUM 9.0  --  8.4*  --   --    < > = values in this interval not displayed.    CMET: Lab Results  Component Value Date   WBC 13.9 (H) 04/24/2018   HGB 11.6 (L) 04/24/2018   HCT 34.0 (L) 04/24/2018   PLT 166 04/24/2018   GLUCOSE 121 (H) 04/24/2018   CHOL 187 04/21/2018   TRIG 86 04/21/2018   HDL 45 04/21/2018   LDLCALC 125 (H) 04/21/2018   ALT 20 04/23/2018   AST 31 04/23/2018   NA 131 (L) 04/24/2018   K 4.1 04/24/2018   CL 96 (L) 04/24/2018   CREATININE 0.90 04/24/2018   BUN 13 04/24/2018   CO2 23 04/24/2018   INR 1.30 04/23/2018   HGBA1C 5.9 (H) 04/23/2018      PT/INR:  Recent Labs    04/23/18 1553  LABPROT 16.1*  INR 1.30   Radiology: No results found.   Assessment/Plan: S/P Procedure(s) (LRB): CORONARY ARTERY BYPASS GRAFTING (CABG) x3 using the right greater saphenous vein harvested endoscopically and the left internal mammary artery. LIMA to LAD, SVG to Distal Circ, SVG to PD (N/A) TRANSESOPHAGEAL ECHOCARDIOGRAM (TEE) (N/A) Mobilize Diuresis Plan for transfer to step-down: see transfer orders   Delight Ovens 04/25/2018 7:18 AM

## 2018-04-26 ENCOUNTER — Inpatient Hospital Stay (HOSPITAL_COMMUNITY): Payer: 59

## 2018-04-26 LAB — CBC
HCT: 30.8 % — ABNORMAL LOW (ref 39.0–52.0)
Hemoglobin: 10.3 g/dL — ABNORMAL LOW (ref 13.0–17.0)
MCH: 29.9 pg (ref 26.0–34.0)
MCHC: 33.4 g/dL (ref 30.0–36.0)
MCV: 89.3 fL (ref 78.0–100.0)
Platelets: 167 10*3/uL (ref 150–400)
RBC: 3.45 MIL/uL — ABNORMAL LOW (ref 4.22–5.81)
RDW: 13.3 % (ref 11.5–15.5)
WBC: 11.1 10*3/uL — ABNORMAL HIGH (ref 4.0–10.5)

## 2018-04-26 LAB — BASIC METABOLIC PANEL
Anion gap: 7 (ref 5–15)
BUN: 9 mg/dL (ref 6–20)
CO2: 29 mmol/L (ref 22–32)
Calcium: 8.5 mg/dL — ABNORMAL LOW (ref 8.9–10.3)
Chloride: 98 mmol/L (ref 98–111)
Creatinine, Ser: 0.91 mg/dL (ref 0.61–1.24)
GFR calc Af Amer: >60 mL/min (ref >60)
GFR calc non Af Amer: >60 mL/min (ref >60)
Glucose, Bld: 142 mg/dL — ABNORMAL HIGH (ref 70–99)
Potassium: 4.4 mmol/L (ref 3.5–5.1)
Sodium: 134 mmol/L — ABNORMAL LOW (ref 135–145)

## 2018-04-26 LAB — GLUCOSE, CAPILLARY
GLUCOSE-CAPILLARY: 136 mg/dL — AB (ref 70–99)
GLUCOSE-CAPILLARY: 159 mg/dL — AB (ref 70–99)
Glucose-Capillary: 119 mg/dL — ABNORMAL HIGH (ref 70–99)
Glucose-Capillary: 67 mg/dL — ABNORMAL LOW (ref 70–99)

## 2018-04-26 MED FILL — Electrolyte-R (PH 7.4) Solution: INTRAVENOUS | Qty: 3000 | Status: AC

## 2018-04-26 MED FILL — Magnesium Sulfate Inj 50%: INTRAMUSCULAR | Qty: 10 | Status: AC

## 2018-04-26 MED FILL — Lidocaine HCl(Cardiac) IV PF Soln Pref Syr 100 MG/5ML (2%): INTRAVENOUS | Qty: 5 | Status: AC

## 2018-04-26 MED FILL — Mannitol IV Soln 20%: INTRAVENOUS | Qty: 500 | Status: AC

## 2018-04-26 MED FILL — Sodium Chloride IV Soln 0.9%: INTRAVENOUS | Qty: 2000 | Status: AC

## 2018-04-26 MED FILL — Heparin Sodium (Porcine) Inj 1000 Unit/ML: INTRAMUSCULAR | Qty: 30 | Status: AC

## 2018-04-26 MED FILL — Sodium Bicarbonate IV Soln 8.4%: INTRAVENOUS | Qty: 50 | Status: AC

## 2018-04-26 MED FILL — Heparin Sodium (Porcine) Inj 1000 Unit/ML: INTRAMUSCULAR | Qty: 10 | Status: AC

## 2018-04-26 MED FILL — Potassium Chloride Inj 2 mEq/ML: INTRAVENOUS | Qty: 40 | Status: AC

## 2018-04-26 NOTE — Progress Notes (Signed)
Patient ID: Zachary [name], male   DOB: Oct 29, 1960, 57 y.o.   MRN: [id number] TCTS Evening Rounds:  Hemodynamically stable in sinus rhythm.  Coughed hard this afternoon and drained some serosanguinous fluid from the upper sternal incision. He says it feels fine now and dressing is dry. Sternum feels stable.  Otherwise stable day.

## 2018-04-26 NOTE — Progress Notes (Signed)
Patient ID: Zachary [name], male   DOB: April 23, 1961, 57 y.o.   MRN: [id number] TCTS DAILY ICU PROGRESS NOTE                   [address]            [name] [address]          [phone]   3 Days Post-Op Procedure(s) (LRB): CORONARY ARTERY BYPASS GRAFTING (CABG) x3 using the right greater saphenous vein harvested endoscopically and the left internal mammary artery. LIMA to LAD, SVG to Distal Circ, SVG to PD (N/A) TRANSESOPHAGEAL ECHOCARDIOGRAM (TEE) (N/A)  Total Length of Stay:  LOS: 6 days   Subjective: Can not sleep in unit   Objective: Vital signs in last 24 hours: Temp:  [97.8 F (36.6 C)-99.6 F (37.6 C)] 99.4 F (37.4 C) (07/02 0400) Pulse Rate:  [69-84] 79 (07/02 0700) Cardiac Rhythm: Normal sinus rhythm (07/02 0400) Resp:  [21-30] 27 (07/02 0700) BP: (96-132)/(61-84) 117/63 (07/02 0700) SpO2:  [94 %-100 %] 94 % (07/02 0700) Weight:  [208 lb 5.4 oz (94.5 kg)] 208 lb 5.4 oz (94.5 kg) (07/02 0500)  Filed Weights   04/24/18 0545 04/25/18 0600 04/26/18 0500  Weight: 214 lb 11.7 oz (97.4 kg) 212 lb 4.9 oz (96.3 kg) 208 lb 5.4 oz (94.5 kg)    Weight change: -3 lb 15.5 oz (-1.8 kg)   Hemodynamic parameters for last 24 hours:    Intake/Output from previous day: 07/01 0701 - 07/02 0700 In: 3302.9 [P.O.:900; IV Piggyback:2402.9] Out: 1120 [Urine:1120]  Intake/Output this shift: No intake/output data recorded.  Current Meds: Scheduled Meds: . amiodarone  400 mg Oral Q12H   Followed by  . [START ON 05/01/2018] amiodarone  400 mg Oral Daily  . aspirin EC  325 mg Oral Daily  . atorvastatin  80 mg Oral q1800  . docusate sodium  200 mg Oral Daily  . enoxaparin (LOVENOX) injection  30 mg Subcutaneous QHS  . insulin aspart  0-24 Units Subcutaneous TID AC & HS  . mouth rinse  15 mL Mouth Rinse BID  . metoprolol tartrate  12.5 mg Oral BID  . pantoprazole  40 mg Oral QAC breakfast  . sodium chloride flush  3 mL Intravenous Q12H   Continuous Infusions: .  sodium chloride     PRN Meds:.sodium chloride, acetaminophen, alum & mag hydroxide-simeth, bisacodyl **OR** bisacodyl, diphenhydrAMINE, magnesium hydroxide, ondansetron **OR** ondansetron (ZOFRAN) IV, oxyCODONE, sodium chloride flush, traMADol  General appearance: alert and cooperative Neurologic: intact Heart: regular rate and rhythm, S1, S2 normal, no murmur, click, rub or gallop Lungs: clear to auscultation bilaterally Abdomen: soft, non-tender; bowel sounds normal; no masses,  no organomegaly Extremities: extremities normal, atraumatic, no cyanosis or edema and Homans sign is negative, no sign of DVT Sternum stable   Lab Results: CBC: Recent Labs    04/25/18 0655 04/26/18 0250  WBC 12.3* 11.1*  HGB 10.6* 10.3*  HCT 32.1* 30.8*  PLT 158 167   BMET:  Recent Labs    04/25/18 0655 04/26/18 0250  NA 133* 134*  K 4.1 4.4  CL 100 98  CO2 24 29  GLUCOSE 116* 142*  BUN 10 9  CREATININE 0.88 0.91  CALCIUM 8.5* 8.5*    CMET: Lab Results  Component Value Date   WBC 11.1 (H) 04/26/2018   HGB 10.3 (L) 04/26/2018   HCT 30.8 (L) 04/26/2018   PLT 167 04/26/2018   GLUCOSE 142 (H) 04/26/2018   CHOL  187 04/21/2018   TRIG 86 04/21/2018   HDL 45 04/21/2018   LDLCALC 125 (H) 04/21/2018   ALT 20 04/23/2018   AST 31 04/23/2018   NA 134 (L) 04/26/2018   K 4.4 04/26/2018   CL 98 04/26/2018   CREATININE 0.91 04/26/2018   BUN 9 04/26/2018   CO2 29 04/26/2018   INR 1.30 04/23/2018   HGBA1C 5.9 (H) 04/23/2018      PT/INR:  Recent Labs    04/23/18 1553  LABPROT 16.1*  INR 1.30   Radiology: Dg Chest 2 View  Result Date: 04/26/2018 CLINICAL DATA:  Post CABG EXAM: CHEST - 2 VIEW COMPARISON:  04/25/2018 FINDINGS: Prior CABG. Mild cardiomegaly. Bibasilar atelectasis and small effusions. No pneumothorax. IMPRESSION: Bibasilar atelectasis and small effusions. Electronically Signed   By: Charlett Nose M.D.   On: 04/26/2018 07:17     Assessment/Plan: S/P Procedure(s)  (LRB): CORONARY ARTERY BYPASS GRAFTING (CABG) x3 using the right greater saphenous vein harvested endoscopically and the left internal mammary artery. LIMA to LAD, SVG to Distal Circ, SVG to PD (N/A) TRANSESOPHAGEAL ECHOCARDIOGRAM (TEE) (N/A) Mobilize Plan for transfer to step-down: see transfer orders Waiting for bed 4e D/c pacing wires  Brief afib in or with cannulation, on Cordarone now no further Afib Home 1-2 days     Delight Ovens 04/26/2018 7:32 AM

## 2018-04-26 NOTE — Discharge Summary (Signed)
Physician Discharge Summary       [address]       [name] [address]             [phone]    Patient ID: Zachary [name] MRN: [id number] DOB/AGE: 1961-06-24 57 y.o.  Admit date: 04/20/2018 Discharge date: 04/28/2018  Admission Diagnoses: 1. S/p NSTEMI (non-ST elevated myocardial infarction) (HCC) 2. Acute coronary syndrome (HCC) 3. Coronary artery disease  Discharge Diagnoses:  1. S/P CABG x 3 2. Pre diabetes (HGA1C 5.9) 3. ABL anemia 4. Peri op atrial fibrillation 5. History of hyperlipidemia 6. History of tobacco abuse   Procedure (s):  Runell Gess, MD (Primary)    Procedures   LEFT HEART CATH AND CORONARY ANGIOGRAPHY by Dr. Allyson Sabal on 04/21/2018:  Conclusion     Mid RCA lesion is 90% stenosed.  Post Atrio lesion is 50% stenosed.  Mid Cx lesion is 95% stenosed.  Prox LAD lesion is 75% stenosed.  The left ventricular systolic function is normal.  LV end diastolic pressure is normal.  The left ventricular ejection fraction is 55-65% by visual estimate.    Emergency coronary artery bypass grafting x3 with left internal mammary to the left anterior descending coronary artery, reverse saphenous vein graft to the posterior descending coronary artery, reverse saphenous vein graft to the distal circumflex coronary artery with right thigh greater saphenous vein harvesting by Dr. Tyrone Sage on 04/23/2018.   History of Presenting Illness: Zachary [name] is a 57 year old male patient with a past medical history significant for hyperlipidemia and tobacco abuse who presented the night of 04/20/2018 with acute onset chest pain.  Earlier that day, he was in his normal state of health and played a full game of golf.  After dinner he began having 7 out of 10 substernal chest pressure with radiation to bilateral shoulders.  He did not have any associated shortness of breath, nausea, vomiting, or diaphoresis.  He tried [place] powder with minimal improvement.  His  pain persisted therefore he took his wife's statin and ramipril and reported to [facility] for further evaluation.  At [facility], he was found to be hypertensive and had a troponin level of 1.4.  He was ruled in for non-STEMI.  His pain eventually subsided in the emergency department.  He was treated with IV heparin, aspirin, beta-blocker and statin.  He was transferred to [facility] underwent cardiac catheterization due to multiple risk factors including a family history of coronary artery disease.  On cardiac catheterization he was found to have a mid RCA lesion that was 90% stenosed, mid circumflex lesion which is 95% stenosed, proximal LAD lesion which was 75% stenosed, and a post atrio lesion which 50% stenosed.  He had an estimated left ventricular ejection fraction of 55 to 65%.  He underwent an echocardiogram that same day which showed mild to moderate mitral regurgitation, and mild to moderate tricuspid regurgitation.  On echocardiogram his estimated ejection fraction was 55 to 60%.  Consult requested yesterday.   Initially was considering CABG on Monday but patient has had chest pain during the night, likely due to coronary disease in the circumflex. The procedure of coronary artery bypass grafting was explained in detail and all questions were answered to the patient's and wife's satisfaction. With the episodic chest pain last night,  being on heparin drip, and high grade circumflex lesion, Dr. Tyrone Sage decided tol proceed with emergency CABG today. Pre operative carotid duplex US showed no significant internal carotid artery  stenosis bilaterally. Patient underwent an emergent CABG x 3 on 04/23/2018.   Brief Hospital Course:  The patient was extubated the evening of surgery without difficulty. He remained afebrile and hemodynamically stable. He was weaned off Neo Synephrine and Amiodarone drips. He was then put on oral Amiodarone. He was on Amiodarone as he had peri op atrial  fibrillation. Theone Murdoch, a line, chest tubes, and foley were removed early in the post operative course. Lopressor was started and titrated accordingly. He remained in sinus rhythm. He was volume over loaded and diuresed. He had ABL anemia. He did not require a post op transfusion. Last H and H was 10/33. He was weaned off the insulin drip.  The patient's glucose remained well controlled.The patient's HGA1C pre op was 5.9. He likely has pre diabetes and will need furthe surveillance of HGA1C by medical doctor as an outpatient. He developed proximal sternal drainage on 07/03. There was no evidence of cellulitis so there was no need for an antibiotic. This drainage did decrease and stop. The patient has been surgically stable for transfer from the ICU to PCTU for further convalescence but there was no bed available so he remained in the ICU. He continues to progress with cardiac rehab. He was ambulating on room air. He has been tolerating a diet and has had a bowel movement. Epicardial pacing wires were removed on 04/26/2018. Chest tube sutures will be removed the day of discharge. The patient is felt surgically stable for discharge today.   Latest Vital Signs: Blood pressure 122/78, pulse 79, temperature 98.2 F (36.8 C), temperature source Oral, resp. rate 16, height 5\' 9"  (1.753 m), weight 93.7 kg (206 lb 9.1 oz), SpO2 97 %.  Physical Exam:  General appearance: alert, cooperative and no distress Heart: regular rate and rhythm Lungs: mildly dim in bases Abdomen: benign Extremities: + edema Wound: incis healing well   Discharge Condition:Stable and discharged to home.  Recent laboratory studies:  Lab Results  Component Value Date   WBC 10.2 04/27/2018   HGB 10.9 (L) 04/27/2018   HCT 33.1 (L) 04/27/2018   MCV 90.4 04/27/2018   PLT 255 04/27/2018   Lab Results  Component Value Date   NA 136 04/27/2018   K 3.8 04/27/2018   CL 100 04/27/2018   CO2 29 04/27/2018   CREATININE 0.92  04/27/2018   GLUCOSE 146 (H) 04/27/2018      Diagnostic Studies: Dg Chest 2 View  Result Date: 04/26/2018 CLINICAL DATA:  Post CABG EXAM: CHEST - 2 VIEW COMPARISON:  04/25/2018 FINDINGS: Prior CABG. Mild cardiomegaly. Bibasilar atelectasis and small effusions. No pneumothorax. IMPRESSION: Bibasilar atelectasis and small effusions. Electronically Signed   By: Charlett Nose M.D.   On: 04/26/2018 07:17   Dg Chest 2 View  Result Date: 04/20/2018 CLINICAL DATA:  Midsternal chest pain with back and left shoulder pain while playing golf today. EXAM: CHEST - 2 VIEW COMPARISON:  None. FINDINGS: The heart size and mediastinal contours are within normal limits. Minimal aortic atherosclerosis. No aneurysm. Both lungs are clear. Slight accentuation of thoracic curvature secondary to multilevel mild disc space narrowing and endplate spurring. Osteoarthritis of the [facility] joints right greater than left with spurring and joint space narrowing. No acute osseous abnormality. IMPRESSION: 1. No active cardiopulmonary disease. 2. Minimal aortic atherosclerosis. 3. Mild thoracic spondylosis. 4. 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 Electronically Signed   By: Tollie Eth M.D.   On: 04/20/2018 21:26   Dg Chest [facility]  1 View  Result Date: 04/25/2018 CLINICAL DATA:  Chest pain.  Recent coronary artery bypass grafting EXAM: PORTABLE CHEST 1 VIEW COMPARISON:  April 24, 2018 FINDINGS: Swan-Ganz catheter is been removed. Cordis tip is in the superior vena cava. No pneumothorax. There is atelectatic change in both mid lung and base regions. There is no consolidation. Heart is mildly enlarged with pulmonary vascularity normal. No adenopathy. No bone lesions. IMPRESSION: Mild atelectasis in the mid lung base regions bilaterally. No consolidation. Stable cardiac silhouette. No pneumothorax. Electronically Signed   By: Bretta Bang III M.D.   On: 04/25/2018 07:26   Dg Chest Port 1 View  Result Date:  04/24/2018 CLINICAL DATA:  S/P CABG x3 EXAM: PORTABLE CHEST - 1 VIEW COMPARISON:  the previous day's study FINDINGS: The patient has been extubated and the nasogastric tube removed. Left chest tube remains in place, no pneumothorax evident. Right IJ Swan-Ganz extends to the pulmonary outflow tract. Patchy linear subsegmental atelectasis or scarring at the left lung base and to a lesser degree in the right mid lung. Mild elevation of the left diaphragmatic leaflet. Heart size upper limits normal. Previous median sternotomy and CABG. No definite effusion. IMPRESSION: 1. Interval extubation with persistent linear scarring or atelectasis at the left lung base and in the right mid lung. 2. Stable left chest tube with no pneumothorax evident. Electronically Signed   By: Corlis Leak M.D.   On: 04/24/2018 08:27   Dg Chest Port 1 View  Result Date: 04/23/2018 CLINICAL DATA:  Status post coronary bypass grafting EXAM: PORTABLE CHEST 1 VIEW COMPARISON:  04/20/2018 FINDINGS: Postsurgical changes are now seen. Swan-Ganz catheter, endotracheal tube and nasogastric catheter are noted in satisfactory position. Left thoracostomy tube and apparent mediastinal drain are seen. No pneumothorax is noted. Patchy atelectatic changes are noted bilaterally slightly worse on the left than the right. No sizable effusion is seen. No acute bony abnormality is noted. IMPRESSION: Patchy bilateral atelectasis. Postoperative changes with tubes and lines as described above. Electronically Signed   By: Alcide Clever M.D.   On: 04/23/2018 16:54   Discharge Instructions    Amb Referral to Cardiac Rehabilitation   Complete by:  As directed    Diagnosis:  CABG   CABG X ___:  3   Discharge patient   Complete by:  As directed    Discharge disposition:  01-Home or Self Care   Discharge patient date:  04/28/2018     Discharge Medications: Allergies as of 04/28/2018   No Known Allergies     Medication List    STOP taking these medications    GOODYS EXTRA STRENGTH 500-325-65 MG Pack Generic drug:  Aspirin-Acetaminophen-Caffeine   naproxen sodium 220 MG tablet Commonly known as:  ALEVE   valACYclovir 500 MG tablet Commonly known as:  VALTREX     TAKE these medications   amiodarone 200 MG tablet Commonly known as:  PACERONE Take 1 tablet (200 mg total) by mouth every 12 (twelve) hours. For 7 days, then once daily   aspirin 325 MG EC tablet Take 1 tablet (325 mg total) by mouth daily.   atorvastatin 80 MG tablet Commonly known as:  LIPITOR Take 1 tablet (80 mg total) by mouth daily at 6 PM.   lisinopril 2.5 MG tablet Commonly known as:  PRINIVIL,ZESTRIL Take 1 tablet (2.5 mg total) by mouth daily. Start taking on:  04/29/2018   metoprolol tartrate 25 MG tablet Commonly known as:  LOPRESSOR Take 0.5 tablets (12.5 mg total) by  mouth 2 (two) times daily.   oxyCODONE 5 MG immediate release tablet Commonly known as:  Oxy IR/ROXICODONE Take 1-2 tablets (5-10 mg total) by mouth every 6 (six) hours as needed for up to 7 days for severe pain.      The patient has been discharged on:   1.Beta Blocker:  Yes [  x ]                              No   [   ]                              If No, reason:  2.Ace Inhibitor/ARB: Yes [ y  ]                                     No  [    ]                                     If No, reason:  3.Statin:   Yes [  x ]                  No  [   ]                  If No, reason:  4.Ecasa:  Yes  [ x  ]                  No   [   ]                  If No, reason:  Follow Up Appointments: Follow-up Information    Delight Ovens, MD. Go to.   Specialty:  Cardiothoracic Surgery Why:  PA/LAT CXR to be taken (at [facility] which is in the same building as Dr. Dennie Maizes office) on at;Appointment time is at Contact information: [address] [place] [address] [phone]        Medical Doctor Follow up.   Why:  Establish with a primary care physician  for further surveillance of HGA1C 5.9 (pre diabetes)       Jodelle Gross, NP. Go on 05/16/2018.   Specialties:  Nurse Practitioner, Radiology, Cardiology Why:  Appointment time is at 11:00 am Contact information: [address] [place] [address] [phone]           Signed: Noel Christmas 04/28/2018, 9:11 AM

## 2018-04-26 NOTE — Progress Notes (Signed)
Pericardial pacing wires pulled per MD order. Pt tolerated procedure well, vital signs stable throughout procedure. Bedrest over at 11:20am on 04/26/18. Will continue to monitor closely.

## 2018-04-26 NOTE — Progress Notes (Signed)
Called into patients room for concerns regarding sternal incision bleeding. On assessment patients dressing saturated in serosanguineous fluid, site cleansed with betadine and new dressing applied. Patient stated, " he coughed and then his incision leaked".  MD Tyrone Sage paged and notified. No new orders at this time. Will continue to monitor closely.

## 2018-04-27 LAB — BASIC METABOLIC PANEL
Anion gap: 7 (ref 5–15)
BUN: 9 mg/dL (ref 6–20)
CO2: 29 mmol/L (ref 22–32)
Calcium: 8.8 mg/dL — ABNORMAL LOW (ref 8.9–10.3)
Chloride: 100 mmol/L (ref 98–111)
Creatinine, Ser: 0.92 mg/dL (ref 0.61–1.24)
GFR calc Af Amer: >60 mL/min (ref >60)
GFR calc non Af Amer: >60 mL/min (ref >60)
Glucose, Bld: 146 mg/dL — ABNORMAL HIGH (ref 70–99)
Potassium: 3.8 mmol/L (ref 3.5–5.1)
Sodium: 136 mmol/L (ref 135–145)

## 2018-04-27 LAB — BPAM RBC
Blood Product Expiration Date: 201907222359
Blood Product Expiration Date: 201907282359
ISSUE DATE / TIME: 201906291039
ISSUE DATE / TIME: 201906291039
Unit Type and Rh: 5100
Unit Type and Rh: 5100

## 2018-04-27 LAB — GLUCOSE, CAPILLARY
GLUCOSE-CAPILLARY: 122 mg/dL — AB (ref 70–99)
Glucose-Capillary: 125 mg/dL — ABNORMAL HIGH (ref 70–99)

## 2018-04-27 LAB — TYPE AND SCREEN
ABO/RH(D): O POS
Antibody Screen: NEGATIVE
Unit division: 0
Unit division: 0

## 2018-04-27 LAB — CBC
HCT: 33.1 % — ABNORMAL LOW (ref 39.0–52.0)
Hemoglobin: 10.9 g/dL — ABNORMAL LOW (ref 13.0–17.0)
MCH: 29.8 pg (ref 26.0–34.0)
MCHC: 32.9 g/dL (ref 30.0–36.0)
MCV: 90.4 fL (ref 78.0–100.0)
Platelets: 255 10*3/uL (ref 150–400)
RBC: 3.66 MIL/uL — ABNORMAL LOW (ref 4.22–5.81)
RDW: 13.6 % (ref 11.5–15.5)
WBC: 10.2 10*3/uL (ref 4.0–10.5)

## 2018-04-27 MED ORDER — POTASSIUM CHLORIDE CRYS ER 20 MEQ PO TBCR
40.0000 meq | EXTENDED_RELEASE_TABLET | Freq: Once | ORAL | Status: AC
  Administered 2018-04-27: 40 meq via ORAL
  Filled 2018-04-27: qty 2

## 2018-04-27 MED ORDER — FUROSEMIDE 10 MG/ML IJ SOLN
40.0000 mg | Freq: Once | INTRAMUSCULAR | Status: AC
  Administered 2018-04-27: 40 mg via INTRAVENOUS
  Filled 2018-04-27: qty 4

## 2018-04-27 MED ORDER — AMIODARONE HCL 200 MG PO TABS: 200.0000 mg | ORAL_TABLET | Freq: Every day | ORAL | Status: DC

## 2018-04-27 MED ORDER — AMIODARONE HCL 200 MG PO TABS
200.0000 mg | ORAL_TABLET | Freq: Two times a day (BID) | ORAL | Status: DC
  Administered 2018-04-27 – 2018-04-28 (×3): 200 mg via ORAL
  Filled 2018-04-27 (×3): qty 1

## 2018-04-27 MED ORDER — LISINOPRIL 2.5 MG PO TABS
2.5000 mg | ORAL_TABLET | Freq: Every day | ORAL | Status: DC
  Administered 2018-04-27 – 2018-04-28 (×2): 2.5 mg via ORAL
  Filled 2018-04-27 (×2): qty 1

## 2018-04-27 NOTE — Progress Notes (Addendum)
[facility] DAILY ICU PROGRESS NOTE                   [address]            [name] [address]          [phone]   4 Days Post-Op Procedure(s) (LRB): CORONARY ARTERY BYPASS GRAFTING (CABG) x3 using the right greater saphenous vein harvested endoscopically and the left internal mammary artery. LIMA to LAD, SVG to Distal Circ, SVG to PD (N/A) TRANSESOPHAGEAL ECHOCARDIOGRAM (TEE) (N/A)  Total Length of Stay:  LOS: 7 days   Subjective: Patient sitting in chair. He complaints of not being able to sleep.  Objective: Vital signs in last 24 hours: Temp:  [98.9 F (37.2 C)-99.2 F (37.3 C)] 98.9 F (37.2 C) (07/03 0020) Pulse Rate:  [65-81] 65 (07/02 1100) Cardiac Rhythm: Normal sinus rhythm (07/03 0426) Resp:  [17-30] 22 (07/03 0426) BP: (114-146)/(68-90) 130/80 (07/03 0426) SpO2:  [96 %-100 %] 96 % (07/03 0426) Weight:  [207 lb 14.3 oz (94.3 kg)] 207 lb 14.3 oz (94.3 kg) (07/03 0545)  Filed Weights   04/25/18 0600 04/26/18 0500 04/27/18 0545  Weight: 212 lb 4.9 oz (96.3 kg) 208 lb 5.4 oz (94.5 kg) 207 lb 14.3 oz (94.3 kg)    Weight change: -7.1 oz (-0.2 kg)      Intake/Output from previous day: 07/02 0701 - 07/03 0700 In: 960 [P.O.:960] Out: 650 [Urine:650]  Intake/Output this shift: No intake/output data recorded.  Current Meds: Scheduled Meds: . amiodarone  400 mg Oral Q12H   Followed by  . [START ON 05/01/2018] amiodarone  400 mg Oral Daily  . aspirin EC  325 mg Oral Daily  . atorvastatin  80 mg Oral q1800  . docusate sodium  200 mg Oral Daily  . enoxaparin (LOVENOX) injection  30 mg Subcutaneous QHS  . insulin aspart  0-24 Units Subcutaneous TID AC & HS  . mouth rinse  15 mL Mouth Rinse BID  . metoprolol tartrate  12.5 mg Oral BID  . pantoprazole  40 mg Oral QAC breakfast  . sodium chloride flush  3 mL Intravenous Q12H   Continuous Infusions: . sodium chloride     PRN Meds:.sodium chloride, acetaminophen, alum & mag hydroxide-simeth,  bisacodyl **OR** bisacodyl, diphenhydrAMINE, magnesium hydroxide, ondansetron **OR** ondansetron (ZOFRAN) IV, oxyCODONE, sodium chloride flush, traMADol  General appearance: alert, cooperative and no distress Heart: RRR Lungs: Slightly diminished at bases Abdomen: Soft, non tender, bowel sounds present Extremities: Bilateral LE edema Wound: Proximal sternal wound has some sero sanguinous drainage but remainder of wound is clean and dry. RLE wound is clean and dry  Lab Results: CBC: Recent Labs    04/26/18 0250 04/27/18 0222  WBC 11.1* 10.2  HGB 10.3* 10.9*  HCT 30.8* 33.1*  PLT 167 255   BMET:  Recent Labs    04/26/18 0250 04/27/18 0222  NA 134* 136  K 4.4 3.8  CL 98 100  CO2 29 29  GLUCOSE 142* 146*  BUN 9 9  CREATININE 0.91 0.92  CALCIUM 8.5* 8.8*    CMET: Lab Results  Component Value Date   WBC 10.2 04/27/2018   HGB 10.9 (L) 04/27/2018   HCT 33.1 (L) 04/27/2018   PLT 255 04/27/2018   GLUCOSE 146 (H) 04/27/2018   CHOL 187 04/21/2018   TRIG 86 04/21/2018   HDL 45 04/21/2018   LDLCALC 125 (H) 04/21/2018   ALT 20 04/23/2018   AST 31 04/23/2018  NA 136 04/27/2018   K 3.8 04/27/2018   CL 100 04/27/2018   CREATININE 0.92 04/27/2018   BUN 9 04/27/2018   CO2 29 04/27/2018   INR 1.30 04/23/2018   HGBA1C 5.9 (H) 04/23/2018      PT/INR: No results for input(s): LABPROT, INR in the last 72 hours. Radiology: No results found.   Assessment/Plan: S/P Procedure(s) (LRB): CORONARY ARTERY BYPASS GRAFTING (CABG) x3 using the right greater saphenous vein harvested endoscopically and the left internal mammary artery. LIMA to LAD, SVG to Distal Circ, SVG to PD (N/A) TRANSESOPHAGEAL ECHOCARDIOGRAM (TEE) (N/A) 1. CV-S/p NSTEMI.  Had peri op a fib. Maintaining SR post op. On Amiodarone 400 mg bid and Lopressor 12.5 mg bid. Will start low dose ACE for better BP control 2. Pulmonary-On room air. Encourage incentive spirometer 3. Volume overload-will give Lasix 40 mg IV  this am 4. ABL anemia-H and H stable at 10.9 and 33.1 5. CBGs 67/159/122. Pre op HGA1C 5.9. He likely has pre diabetes. Will stop accu checks and he will need further surveillance of HGA1C by medical doctor after discharge. 6. Supplement potassium 7. Proximal sero sanguinous sternal drainage-continue dressing changes as needed. No need for antibiotic at this time as no evidence of cellulitis. 8. Likely discharge once  sternal drainage has decreased or stopped  Donielle Margaretann Loveless PA-C 04/27/2018 7:36 AM   Sternum stable, small serous sangous area on upper part of dressing Plan home tomorrow if wounds ok I have seen and examined Larene Pickett and agree with the above assessment  and plan.  Delight Ovens MD Beeper [phone] Office [phone] 04/27/2018 8:25 AM

## 2018-04-27 NOTE — Progress Notes (Signed)
CARDIAC REHAB PHASE I   PRE:  Rate/Rhythm: 81 SR    BP: sitting 118/78    SaO2: 98 RA  MODE:  Ambulation: 470 ft   POST:  Rate/Rhythm: 97 SR    BP: sitting 138/70     SaO2: 97 RA  Pt moving independently. Could have walked farther. Only c/o is pain with coughing therefore he is restricting his coughing. Encouraged pain control and phlegm production. Ed completed with pt and wife, good reception. He is planning to quit smoking and thankful for fake cigarette and resources. Will send to [place]. Can walk independently. [phone]   Harriet Masson CES, ACSM 04/27/2018 4:24 PM

## 2018-04-27 NOTE — Discharge Instructions (Signed)
Prediabetes Eating Plan °Prediabetes--also called impaired glucose tolerance or impaired fasting glucose--is a condition that causes blood sugar (blood glucose) levels to be higher than normal. Following a healthy diet can help to keep prediabetes under control. It can also help to lower the risk of type 2 diabetes and heart disease, which are increased in people who have prediabetes. Along with regular exercise, a healthy diet: °· Promotes weight loss. °· Helps to control blood sugar levels. °· Helps to improve the way that the body uses insulin. ° °What do I need to know about this eating plan? °· Use the glycemic index (GI) to plan your meals. The index tells you how quickly a food will raise your blood sugar. Choose low-GI foods. These foods take a longer time to raise blood sugar. °· Pay close attention to the amount of carbohydrates in the food that you eat. Carbohydrates increase blood sugar levels. °· Keep track of how many calories you take in. Eating the right amount of calories will help you to achieve a healthy weight. Losing about 7 percent of your starting weight can help to prevent type 2 diabetes. °· You may want to follow a Mediterranean diet. This diet includes a lot of vegetables, lean meats or fish, whole grains, fruits, and healthy oils and fats. °What foods can I eat? °Grains °Whole grains, such as whole-wheat or whole-grain breads, crackers, cereals, and pasta. Unsweetened oatmeal. Bulgur. Barley. Quinoa. Brown rice. Corn or whole-wheat flour tortillas or taco shells. °Vegetables °Lettuce. Spinach. Peas. Beets. Cauliflower. Cabbage. Broccoli. Carrots. Tomatoes. Squash. Eggplant. Herbs. Peppers. Onions. Cucumbers. Brussels sprouts. °Fruits °Berries. Bananas. Apples. Oranges. Grapes. Papaya. Mango. Pomegranate. Kiwi. Grapefruit. Cherries. °Meats and Other Protein Sources °Seafood. Lean meats, such as chicken and turkey or lean cuts of pork and beef. Tofu. Eggs. Nuts. Beans. °Dairy °Low-fat or  fat-free dairy products, such as yogurt, cottage cheese, and cheese. °Beverages °Water. Tea. Coffee. Sugar-free or diet soda. Seltzer water. Milk. Milk alternatives, such as soy or almond milk. °Condiments °Mustard. Relish. Low-fat, low-sugar ketchup. Low-fat, low-sugar barbecue sauce. Low-fat or fat-free mayonnaise. °Sweets and Desserts °Sugar-free or low-fat pudding. Sugar-free or low-fat ice cream and other frozen treats. °Fats and Oils °Avocado. Walnuts. Olive oil. °The items listed above may not be a complete list of recommended foods or beverages. Contact your dietitian for more options. °What foods are not recommended? °Grains °Refined white flour and flour products, such as bread, pasta, snack foods, and cereals. °Beverages °Sweetened drinks, such as sweet iced tea and soda. °Sweets and Desserts °Baked goods, such as cake, cupcakes, pastries, cookies, and cheesecake. °The items listed above may not be a complete list of foods and beverages to avoid. Contact your dietitian for more information. °This information is not intended to replace advice given to you by your health care provider. Make sure you discuss any questions you have with your health care provider. °Document Released: 02/26/2015 Document Revised: 03/19/2016 Document Reviewed: 11/07/2014 °Elsevier Interactive Patient Education © 2017 [employer] ° °Coronary Artery Bypass Grafting, Care After °These instructions give you information on caring for yourself after your procedure. Your doctor may also give you more specific instructions. Call your doctor if you have any problems or questions after your procedure. °Follow these instructions at home: °· Only take medicine as told by your doctor. Take medicines exactly as told. Do not stop taking medicines or start any new medicines without talking to your doctor first. °· Take your pulse as told by your doctor. °· Do deep   breathing as told by your doctor. Use your breathing device (incentive  spirometer), if given, to practice deep breathing several times a day. Support your chest with a pillow or your arms when you take deep breaths or cough. °· Keep the area clean, dry, and protected where the surgery cuts (incisions) were made. Remove bandages (dressings) only as told by your doctor. If strips were applied to surgical area, do not take them off. They fall off on their own. °· Check the surgery area daily for puffiness (swelling), redness, or leaking fluid. °· If surgery cuts were made in your legs: °? Avoid crossing your legs. °? Avoid sitting for long periods of time. Change positions every 30 minutes. °? Raise your legs when you are sitting. Place them on pillows. °· Wear stockings that help keep blood clots from forming in your legs (compression stockings). °· Only take sponge baths until your doctor says it is okay to take showers. Pat the surgery area dry. Do not rub the surgery area with a washcloth or towel. Do not bathe, swim, or use a hot tub until your doctor says it is okay. °· Eat foods that are high in fiber. These include raw fruits and vegetables, whole grains, beans, and nuts. Choose lean meats. Avoid canned, processed, and fried foods. °· Drink enough fluids to keep your pee (urine) clear or pale yellow. °· Weigh yourself every day. °· Rest and limit activity as told by your doctor. You may be told to: °? Stop any activity if you have chest pain, shortness of breath, changes in heartbeat, or dizziness. Get help right away if this happens. °? Move around often for short amounts of time or take short walks as told by your doctor. Gradually become more active. You may need help to strengthen your muscles and build endurance. °? Avoid lifting, pushing, or pulling anything heavier than 10 pounds (4.5 kg) for at least 6 weeks after surgery. °· Do not drive until your doctor says it is okay. °· Ask your doctor when you can go back to work. °· Ask your doctor when you can begin sexual  activity again. °· Follow up with your doctor as told. °Contact a doctor if: °· You have puffiness, redness, more pain, or fluid draining from the incision site. °· You have a fever. °· You have puffiness in your ankles or legs. °· You have pain in your legs. °· You gain 2 or more pounds (0.9 kg) a day. °· You feel sick to your stomach (nauseous) or throw up (vomit). °· You have watery poop (diarrhea). °Get help right away if: °· You have chest pain that goes to your jaw or arms. °· You have shortness of breath. °· You have a fast or irregular heartbeat. °· You notice a "clicking" in your breastbone when you move. °· You have numbness or weakness in your arms or legs. °· You feel dizzy or light-headed. °This information is not intended to replace advice given to you by your health care provider. Make sure you discuss any questions you have with your health care provider. °Document Released: 10/17/2013 Document Revised: 03/19/2016 Document Reviewed: 03/21/2013 °Elsevier Interactive Patient Education © 2017 Elsevier Inc. ° °

## 2018-04-27 NOTE — Progress Notes (Signed)
Transfer orders in place. Okay per Dr. Tyrone Sage to spot check VS q4 to promote sleep/rest.

## 2018-04-28 MED ORDER — ATORVASTATIN CALCIUM 80 MG PO TABS: 80.0000 mg | ORAL_TABLET | Freq: Every day | ORAL | 1 refills | Status: DC

## 2018-04-28 MED ORDER — METOPROLOL TARTRATE 25 MG PO TABS: 12.5000 mg | ORAL_TABLET | Freq: Two times a day (BID) | ORAL | 1 refills | Status: DC

## 2018-04-28 MED ORDER — LISINOPRIL 2.5 MG PO TABS: 2.5000 mg | ORAL_TABLET | Freq: Every day | ORAL | 1 refills | Status: DC

## 2018-04-28 MED ORDER — OXYCODONE HCL 5 MG PO TABS: 5.0000 mg | ORAL_TABLET | Freq: Four times a day (QID) | ORAL | 0 refills | Status: AC | PRN

## 2018-04-28 MED ORDER — AMIODARONE HCL 200 MG PO TABS: 200.0000 mg | ORAL_TABLET | Freq: Two times a day (BID) | ORAL | 1 refills | Status: DC

## 2018-04-28 MED ORDER — ASPIRIN 325 MG PO TBEC: 325.0000 mg | DELAYED_RELEASE_TABLET | Freq: Every day | ORAL | Status: DC

## 2018-04-28 NOTE — Care Management Note (Signed)
Case Management Note Donn Pierini RN,BSN [facility] 1-22 Case Manager  [phone]  Patient Details  Name: Zachary [name] MRN: [id number] Date of Birth: [birth year]  Subjective/Objective: Pt admitted with NSTEMI,  s/p CABG 04/23/18                 Action/Plan: PTA pt lived at home with wife, independent, anticipate return home post op. CM to follow for transition of care needs.   Expected Discharge Date:  04/28/18               Expected Discharge Plan:  Home/Self Care(ndependent from home with wife, has PCP and denied barriers with obtaining/paying for medications)  In-House Referral:  NA  Discharge planning Services  CM Consult  Post Acute Care Choice:  NA Choice offered to:  NA  DME Arranged:    DME Agency:     HH Arranged:    HH Agency:     Status of Service:  Completed, signed off  If discussed at [employer] of Stay Meetings, dates discussed:    Discharge Disposition: home/self care   Additional Comments:  04/28/18- 1000- Zachary Skidgel RN, CM- pt for discharge home today- no CM needs noted for transition home.   Darrold Span, RN 04/28/2018, 2:10 PM

## 2018-04-28 NOTE — Plan of Care (Signed)
Adequate for discharge.

## 2018-04-28 NOTE — Progress Notes (Signed)
Pt discharged in stable condition into the care of his family via private vehicle.  PIV removed intact w/o S&S of complications.  Chest tube sutures removed without S&S of complications.  Discharge instructions reviewed with pt. Pt verbalized understanding.  Prescriptions given to pt.

## 2018-04-28 NOTE — Progress Notes (Signed)
      [address]       [name] [address]             [phone]      5 Days Post-Op Procedure(s) (LRB): CORONARY ARTERY BYPASS GRAFTING (CABG) x3 using the right greater saphenous vein harvested endoscopically and the left internal mammary artery. LIMA to LAD, SVG to Distal Circ, SVG to PD (N/A) TRANSESOPHAGEAL ECHOCARDIOGRAM (TEE) (N/A) Subjective: Feels fine  Objective: Vital signs in last 24 hours: Temp:  [98.1 F (36.7 C)-98.6 F (37 C)] 98.2 F (36.8 C) (07/04 0546) Pulse Rate:  [70-86] 79 (07/04 0546) Cardiac Rhythm: Normal sinus rhythm (07/04 0700) Resp:  [16-22] 16 (07/04 0546) BP: (117-126)/(72-78) 122/78 (07/04 0546) SpO2:  [95 %-99 %] 97 % (07/04 0546) Weight:  [93.7 kg (206 lb 9.1 oz)] 93.7 kg (206 lb 9.1 oz) (07/04 0546)  Hemodynamic parameters for last 24 hours:    Intake/Output from previous day: 07/03 0701 - 07/04 0700 In: 720 [P.O.:720] Out: 450 [Urine:450] Intake/Output this shift: No intake/output data recorded.  General appearance: alert, cooperative and no distress Heart: regular rate and rhythm Lungs: mildly dim in bases Abdomen: benign Extremities: + edema Wound: incis healing well  Lab Results: Recent Labs    04/26/18 0250 04/27/18 0222  WBC 11.1* 10.2  HGB 10.3* 10.9*  HCT 30.8* 33.1*  PLT 167 255   BMET:  Recent Labs    04/26/18 0250 04/27/18 0222  NA 134* 136  K 4.4 3.8  CL 98 100  CO2 29 29  GLUCOSE 142* 146*  BUN 9 9  CREATININE 0.91 0.92  CALCIUM 8.5* 8.8*    PT/INR: No results for input(s): LABPROT, INR in the last 72 hours. ABG    Component Value Date/Time   PHART 7.357 04/23/2018 1941   HCO3 24.2 04/23/2018 1941   TCO2 23 04/24/2018 1638   ACIDBASEDEF 1.0 04/23/2018 1941   O2SAT 98.0 04/23/2018 1941   CBG (last 3)  Recent Labs    04/26/18 1551 04/26/18 2040 04/27/18 0640  GLUCAP 125* 159* 122*    Meds Scheduled Meds: . amiodarone  200 mg Oral Q12H   Followed by  . [START ON  05/01/2018] amiodarone  200 mg Oral Daily  . aspirin EC  325 mg Oral Daily  . atorvastatin  80 mg Oral q1800  . docusate sodium  200 mg Oral Daily  . enoxaparin (LOVENOX) injection  30 mg Subcutaneous QHS  . lisinopril  2.5 mg Oral Daily  . mouth rinse  15 mL Mouth Rinse BID  . metoprolol tartrate  12.5 mg Oral BID  . pantoprazole  40 mg Oral QAC breakfast  . sodium chloride flush  3 mL Intravenous Q12H   Continuous Infusions: . sodium chloride     PRN Meds:.sodium chloride, acetaminophen, alum & mag hydroxide-simeth, bisacodyl **OR** bisacodyl, diphenhydrAMINE, magnesium hydroxide, ondansetron **OR** ondansetron (ZOFRAN) IV, oxyCODONE, sodium chloride flush, traMADol  Xrays No results found.  Assessment/Plan: S/P Procedure(s) (LRB): CORONARY ARTERY BYPASS GRAFTING (CABG) x3 using the right greater saphenous vein harvested endoscopically and the left internal mammary artery. LIMA to LAD, SVG to Distal Circ, SVG to PD (N/A) TRANSESOPHAGEAL ECHOCARDIOGRAM (TEE) (N/A)   1 conts with excellent progress , wounds look fine, ok for discharge 2 hemodyn stable in sinus rhythm 3 sats good on RA 4 sugars adeq controlled- will need close f/u 5 no new labs  LOS: 8 days    Rowe Clack 04/28/2018

## 2018-04-28 NOTE — Progress Notes (Signed)
Progress Note  Patient Name: Zachary [name] Date of Encounter: 04/28/2018  Primary Cardiologist: Dr Jens Som  Subjective   No chest pain or dyspnea  Inpatient Medications    Scheduled Meds: . amiodarone  200 mg Oral Q12H   Followed by  . [START ON 05/01/2018] amiodarone  200 mg Oral Daily  . aspirin EC  325 mg Oral Daily  . atorvastatin  80 mg Oral q1800  . docusate sodium  200 mg Oral Daily  . enoxaparin (LOVENOX) injection  30 mg Subcutaneous QHS  . lisinopril  2.5 mg Oral Daily  . mouth rinse  15 mL Mouth Rinse BID  . metoprolol tartrate  12.5 mg Oral BID  . pantoprazole  40 mg Oral QAC breakfast  . sodium chloride flush  3 mL Intravenous Q12H   Continuous Infusions: . sodium chloride     PRN Meds: sodium chloride, acetaminophen, alum & mag hydroxide-simeth, bisacodyl **OR** bisacodyl, diphenhydrAMINE, magnesium hydroxide, ondansetron **OR** ondansetron (ZOFRAN) IV, oxyCODONE, sodium chloride flush, traMADol   Vital Signs    Vitals:   04/27/18 1447 04/27/18 1515 04/27/18 2100 04/28/18 0546  BP: 117/75 118/78 126/72 122/78  Pulse:  82 86 79  Resp: (!) 22 19 18 16   Temp:  98.6 F (37 C) 98.4 F (36.9 C) 98.2 F (36.8 C)  TempSrc:  Oral Oral Oral  SpO2: 95% 97% 97% 97%  Weight:    206 lb 9.1 oz (93.7 kg)  Height:        Intake/Output Summary (Last 24 hours) at 04/28/2018 0832 Last data filed at 04/28/2018 0400 Gross per 24 hour  Intake 360 ml  Output 450 ml  Net -90 ml   Filed Weights   04/26/18 0500 04/27/18 0545 04/28/18 0546  Weight: 208 lb 5.4 oz (94.5 kg) 207 lb 14.3 oz (94.3 kg) 206 lb 9.1 oz (93.7 kg)    Telemetry    Sinus- Personally Reviewed  Physical Exam   GEN: WD WN NAD Neck: supple Cardiac: RRR, no murmur Respiratory: CTA; s/p sternotomy GI: Soft, NT, ND, no masses MS: trace edema Neuro:  No focal findings   Labs    Chemistry Recent Labs  Lab 04/23/18 0923  04/25/18 0655 04/26/18 0250 04/27/18 0222  NA 135   < > 133* 134*  136  K 4.1   < > 4.1 4.4 3.8  CL 99   < > 100 98 100  CO2 26   < > 24 29 29   GLUCOSE 132*   < > 116* 142* 146*  BUN 11   < > 10 9 9   CREATININE 0.87   < > 0.88 0.91 0.92  CALCIUM 9.0   < > 8.5* 8.5* 8.8*  PROT 6.4*  --   --   --   --   ALBUMIN 3.7  --   --   --   --   AST 31  --   --   --   --   ALT 20  --   --   --   --   ALKPHOS 53  --   --   --   --   BILITOT 0.9  --   --   --   --   GFRNONAA >60   < > >60 >60 >60  GFRAA >60   < > >60 >60 >60  ANIONGAP 10   < > 9 7 7    < > = values in this interval not displayed.     Hematology  Recent Labs  Lab 04/25/18 0655 04/26/18 0250 04/27/18 0222  WBC 12.3* 11.1* 10.2  RBC 3.54* 3.45* 3.66*  HGB 10.6* 10.3* 10.9*  HCT 32.1* 30.8* 33.1*  MCV 90.7 89.3 90.4  MCH 29.9 29.9 29.8  MCHC 33.0 33.4 32.9  RDW 13.3 13.3 13.6  PLT 158 167 255    Cardiac Enzymes Recent Labs  Lab 04/21/18 1438 04/23/18 0822  TROPONINI 1.02* 1.57*    Radiology    No results found. Patient Profile     57 y.o. male status post non-ST elevation myocardial infarction.  Cardiac catheterization reveals three-vessel coronary artery disease.  Echocardiogram shows normal LV function, mild to moderate mitral regurgitation and biatrial enlargement.  Status post emergent coronary artery bypass and graft  Assessment & Plan    1 coronary artery disease status post coronary artery bypass and graft-plan to continue aspirin and statin.  2 non-ST elevation myocardial infarction-plan to continue beta-blocker.  3 Tobacco abuse-patient previously counseled on discontinuing.    4 hyperlipidemia-continue statin.  Check lipids and liver in 4 weeks.  5 postoperative volume excess-patient remains mildly volume overloaded.  Would continue Lasix 20 mg daily at discharge.  Check potassium and renal function in 1 week.  6 Perioperative atrial fibrillation- would continue amiodarone for 6 to 8 weeks and if patient maintains sinus rhythm can discontinue at that  point.  CARDIOLOGY RECOMMENDATIONS:  Discharge is anticipated in the next 48 hours. Recommendations for medications and follow up:  Discharge Medications: Continue medications as they are currently listed in the [facility]. Exceptions to the above:  Lasix 20 mg daily for 7 days  Follow Up: The patient's Primary Cardiologist is Dr Olga Millers  Follow up in the office in 2-4 week(s) with cardiology APP.  Signed,  Olga Millers, MD  8:41 AM 04/28/2018  [facility]  For questions or updates, please contact [facility] Please consult [email or web address] for contact info under Cardiology/STEMI.      Signed, Olga Millers, MD  04/28/2018, 8:32 AM

## 2018-05-02 ENCOUNTER — Telehealth (HOSPITAL_COMMUNITY): Payer: Self-pay

## 2018-05-02 NOTE — Telephone Encounter (Signed)
Called patient to see if he is interested in the Cardiac Rehab program - patient stated he may be. Explained scheduling process and went over insurance, patient verbalized understanding. Will contact patient for scheduling upon review by the RN Navigator once follow up appt has been completed.

## 2018-05-02 NOTE — Telephone Encounter (Signed)
Patients insurance is active and benefits verified through [facility] - No co-pay, deductible amount of $3,000/$3,000 has been met, out of pocket amount of $6,000/$4,210.66 has been met, 20% co-insurance, and no pre-authorization is required. Passport/reference [id number]  Will contact patient to see if he is interested in the Cardiac Rehab Program. If interested, patient will need to complete follow up appt. Once completed, patient will be contacted for scheduling upon review by the RN Navigator.

## 2018-05-03 ENCOUNTER — Telehealth: Payer: Self-pay | Admitting: Cardiology

## 2018-05-03 NOTE — Telephone Encounter (Signed)
New Message:        Pt's wife is calling and states what can he take to help with bowel movements. Pt just had open heart surgery.

## 2018-05-03 NOTE — Telephone Encounter (Signed)
Returned call to patient. Explained that he can try colace OTC, hydrate, fiber-rich diet or metamucil.

## 2018-05-10 ENCOUNTER — Ambulatory Visit (INDEPENDENT_AMBULATORY_CARE_PROVIDER_SITE_OTHER): Payer: 59 | Admitting: Internal Medicine

## 2018-05-10 ENCOUNTER — Encounter: Payer: Self-pay | Admitting: Internal Medicine

## 2018-05-10 VITALS — BP 102/60 | HR 72 | Temp 98.2°F | Ht 70.0 in | Wt 200.0 lb

## 2018-05-10 DIAGNOSIS — E559 Vitamin D deficiency, unspecified: Secondary | ICD-10-CM

## 2018-05-10 DIAGNOSIS — R5383 Other fatigue: Secondary | ICD-10-CM | POA: Diagnosis not present

## 2018-05-10 DIAGNOSIS — Z87891 Personal history of nicotine dependence: Secondary | ICD-10-CM

## 2018-05-10 DIAGNOSIS — I252 Old myocardial infarction: Secondary | ICD-10-CM | POA: Diagnosis not present

## 2018-05-10 DIAGNOSIS — Z951 Presence of aortocoronary bypass graft: Secondary | ICD-10-CM

## 2018-05-10 DIAGNOSIS — Z1159 Encounter for screening for other viral diseases: Secondary | ICD-10-CM

## 2018-05-10 DIAGNOSIS — E7800 Pure hypercholesterolemia, unspecified: Secondary | ICD-10-CM | POA: Diagnosis not present

## 2018-05-10 DIAGNOSIS — D649 Anemia, unspecified: Secondary | ICD-10-CM

## 2018-05-10 DIAGNOSIS — Z0000 Encounter for general adult medical examination without abnormal findings: Secondary | ICD-10-CM

## 2018-05-10 NOTE — Progress Notes (Signed)
   Subjective:    Patient ID: Zachary [name], male    DOB: 05-Aug-1961, 57 y.o.   MRN: [id number]  HPI 57 year old White Male presents to the office for the first time today.  His wife is Antwyne Klinglesmith who is also a patient here.  Patient was hospitalized acutely with an MI on June 26.  He had played golf that day and that evening had chest pain.  He presented to the emergency department, diagnosed with NSTEMI, and was found to have a troponin level of 1.4.  On June 27 he underwent cardiac catheterization by Dr. Gery Pray.  He had a mid RCA lesion 90% stenosed, mid circumflex lesion 95% stenosed and proximal LAD 75% stenosed.  Subsequently underwent coronary artery bypass grafting x3 to the LAD, posterior descending coronary artery, distal circumflex.  He was discharged July 4t and has done well since that time.  He has follow-up appointment at Cardiology office on July 22.  Past medical history remarkable for hyperlipidemia.  Family history: His brother had recently had stent placement June 2019.  Father age 64 with history of stroke in March 2019 but in good health.  Mother age 40 in good health.  both parents with hypertension.  Father with history of kidney stones.  Social history history of smoking cigarettes.  Social alcohol consumption.  2 adult children a son and a daughter.  He is employed in [employer] with [employer]  No known drug allergies.  History of left tibia fracture 1983.  Old records from [facility] indicates that he had shingles vaccine given there October 23, 2015.  He had a physical exam at that time and blood pressure was 131/78.  He had vitamin D deficiency at that time at 21.7  PSA was normal at 1.91.  He had total cholesterol of 213, triglycerides 103, HDL of 44 and an LDL of 148.  Fasting glucose was 113.   Review of Systems see above.  Chest is sore from sternotomy.     Objective:   Physical Exam  Constitutional: He is oriented to person, place, and time. He appears  well-developed and well-nourished.  HENT:  Head: Normocephalic.  Right Ear: External ear normal.  Left Ear: External ear normal.  Mouth/Throat: Oropharynx is clear and moist. No oropharyngeal exudate.  Eyes: Pupils are equal, round, and reactive to light. Conjunctivae and EOM are normal. Right eye exhibits no discharge. Left eye exhibits no discharge. No scleral icterus.  Neck: Neck supple. No JVD present. No thyromegaly present.  Cardiovascular: Normal rate, regular rhythm, normal heart sounds and intact distal pulses. Exam reveals no gallop and no friction rub.  Pulmonary/Chest: Effort normal and breath sounds normal. No stridor. No respiratory distress. He has no wheezes. He has no rales. He exhibits tenderness.  Abdominal: He exhibits no distension and no mass. There is no tenderness. There is no rebound and no guarding. No hernia.  Genitourinary: Prostate normal.  Musculoskeletal: He exhibits no edema.  Lymphadenopathy:    He has no cervical adenopathy.  Neurological: He is alert and oriented to person, place, and time. He displays normal reflexes. No cranial nerve deficit. He exhibits normal muscle tone. Coordination normal.  Skin: Skin is warm and dry. No rash noted.  Psychiatric: He has a normal mood and affect. His behavior is normal. Judgment and thought content normal.  Vitals reviewed.         Assessment & Plan:

## 2018-05-11 ENCOUNTER — Telehealth: Payer: Self-pay

## 2018-05-11 LAB — CBC WITH DIFFERENTIAL/PLATELET
Basophils Absolute: 55 cells/uL (ref 0–200)
Basophils Relative: 0.6 %
Eosinophils Absolute: 328 cells/uL (ref 15–500)
Eosinophils Relative: 3.6 %
HCT: 34.4 % — ABNORMAL LOW (ref 38.5–50.0)
Hemoglobin: 12 g/dL — ABNORMAL LOW (ref 13.2–17.1)
Lymphs Abs: 2120 cells/uL (ref 850–3900)
MCH: 29.9 pg (ref 27.0–33.0)
MCHC: 34.9 g/dL (ref 32.0–36.0)
MCV: 85.6 fL (ref 80.0–100.0)
MPV: 8.6 fL (ref 7.5–12.5)
Monocytes Relative: 10.5 %
Neutro Abs: 5642 cells/uL (ref 1500–7800)
Neutrophils Relative %: 62 %
Platelets: 690 10*3/uL — ABNORMAL HIGH (ref 140–400)
RBC: 4.02 10*6/uL — ABNORMAL LOW (ref 4.20–5.80)
RDW: 12.8 % (ref 11.0–15.0)
Total Lymphocyte: 23.3 %
WBC mixed population: 956 cells/uL — ABNORMAL HIGH (ref 200–950)
WBC: 9.1 10*3/uL (ref 3.8–10.8)

## 2018-05-11 LAB — COMPLETE METABOLIC PANEL WITH GFR
AG Ratio: 1.6 (calc) (ref 1.0–2.5)
ALT: 31 U/L (ref 9–46)
AST: 15 U/L (ref 10–35)
Albumin: 4.1 g/dL (ref 3.6–5.1)
Alkaline phosphatase (APISO): 83 U/L (ref 40–115)
BUN: 13 mg/dL (ref 7–25)
CO2: 29 mmol/L (ref 20–32)
Calcium: 10 mg/dL (ref 8.6–10.3)
Chloride: 98 mmol/L (ref 98–110)
Creat: 1.16 mg/dL (ref 0.70–1.33)
GFR, Est African American: 79 mL/min/{1.73_m2} (ref >=60)
GFR, Est Non African American: 69 mL/min/{1.73_m2} (ref >=60)
Globulin: 2.6 g/dL (calc) (ref 1.9–3.7)
Glucose, Bld: 119 mg/dL (ref 65–139)
Potassium: 5.3 mmol/L (ref 3.5–5.3)
Sodium: 136 mmol/L (ref 135–146)
Total Bilirubin: 0.5 mg/dL (ref 0.2–1.2)
Total Protein: 6.7 g/dL (ref 6.1–8.1)

## 2018-05-11 LAB — HEPATITIS C ANTIBODY
Hepatitis C Ab: NONREACTIVE
SIGNAL TO CUT-OFF: 0.03 (ref <1.00)

## 2018-05-11 LAB — TSH: TSH: 3.66 mIU/L (ref 0.40–4.50)

## 2018-05-11 NOTE — Telephone Encounter (Signed)
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  He is s/p CABG with Dr. Tyrone Sage on 04/23/18.  I advised him not to drive until seen in the office with Dr. Tyrone Sage on 06/02/18 to ensure sternal healing and stability.  I did tell him he could put with the golf club in the meantime as long as he was having someone drive him to the course.  He acknowledged receipt.

## 2018-05-15 NOTE — Progress Notes (Signed)
Cardiology Office Note   Date:  05/16/2018   ID:  Zachary [name], DOB 01-Jul-1961, MRN [id number]  PCP:  Margaree Mackintosh, MD  Cardiologist: Dr. Jens Som Chief Complaint  Patient presents with  . Follow-up    slight chest pains in the mornings and when sneezing/coughing, denies SOB, denies swelling, medication questions (amiodarone)     History of Present Illness: Zachary [name] is a 57 y.o. male who presents for ongoing assessment and management of coronary artery disease, with recent hospitalization on 04/20/2018.  The patient had cardiac catheterization which revealed three-vessel coronary artery disease.  He had emergent CABG was completed on 7/1//2019 with LIMA to LAD, SVG to distal circumflex, SVG to PD by Dr. Tyrone Sage. He experienced perioperative atrial fibrillation and was, to continue amiodarone for 6 to 8 weeks and discontinue if he remained in normal sinus rhythm.  Other history includes ongoing tobacco abuse, hyperlipidemia, and postoperative CHF.  He comes today with multiple questions postoperatively.  He is medically compliant still feels a little sore.  He is not sleeping well.  His wife states that he wakes up often and snores very loudly.  He falls asleep often during the day as well with daytime sleepiness.  He has not yet follow-up with surgery and therefore has not begun cardiac rehab.  The patient is impatient about being able to drive again.  Past Medical History:  Diagnosis Date  . Hyperlipemia     Past Surgical History:  Procedure Laterality Date  . CORONARY ARTERY BYPASS GRAFT N/A 04/23/2018   Procedure: CORONARY ARTERY BYPASS GRAFTING (CABG) x3 using the right greater saphenous vein harvested endoscopically and the left internal mammary artery. LIMA to LAD, SVG to Distal Circ, SVG to PD;  Surgeon: Delight Ovens, MD;  Location: [facility] OR;  Service: Open Heart Surgery;  Laterality: N/A;  . LEFT HEART CATH AND CORONARY ANGIOGRAPHY N/A 04/21/2018   Procedure: LEFT HEART  CATH AND CORONARY ANGIOGRAPHY;  Surgeon: Runell Gess, MD;  Location: [facility] INVASIVE CV LAB;  Service: Cardiovascular;  Laterality: N/A;  . TEE WITHOUT CARDIOVERSION N/A 04/23/2018   Procedure: TRANSESOPHAGEAL ECHOCARDIOGRAM (TEE);  Surgeon: Delight Ovens, MD;  Location: [facility] OR;  Service: Open Heart Surgery;  Laterality: N/A;     Current Outpatient Medications  Medication Sig Dispense Refill  . amiodarone (PACERONE) 200 MG tablet Take 1 tablet (200 mg total) by mouth daily. 20 tablet 0  . aspirin EC 325 MG EC tablet Take 1 tablet (325 mg total) by mouth daily.    Marland Kitchen atorvastatin (LIPITOR) 80 MG tablet Take 1 tablet (80 mg total) by mouth daily at 6 PM. 30 tablet 1  . lisinopril (PRINIVIL,ZESTRIL) 2.5 MG tablet Take 1 tablet (2.5 mg total) by mouth daily. 30 tablet 1  . metoprolol tartrate (LOPRESSOR) 25 MG tablet Take 0.5 tablets (12.5 mg total) by mouth 2 (two) times daily. 30 tablet 1  . oxyCODONE (OXY IR/ROXICODONE) 5 MG immediate release tablet Take 5 mg by mouth every 6 (six) hours as needed for severe pain.     No current facility-administered medications for this visit.     Allergies:   Patient has no known allergies.    Social History:  The patient  reports that he quit smoking about 3 weeks ago. His smoking use included cigarettes. He started smoking about 35 years ago. He has never used smokeless tobacco. He reports that he drinks alcohol. He reports that he does not use drugs.   Family History:  The  patient's family history includes Aortic stenosis in his brother; CAD in his brother and father; Stroke in his father.    ROS: All other systems are reviewed and negative. Unless otherwise mentioned in H&P    PHYSICAL EXAM: VS:  BP 122/64 (BP Location: Left Arm, Patient Position: Sitting)   Pulse (!) 59   Ht 5\' 9"  (1.753 m)   Wt 201 lb 9.6 oz (91.4 kg)   SpO2 98%   BMI 29.77 kg/m  , BMI Body mass index is 29.77 kg/m. GEN: Well nourished, well developed, in no acute  distress  HEENT: normal  Neck: no JVD, carotid bruits, or masses Cardiac: RRR; no murmurs, rubs, or gallops,no edema  Respiratory:  clear to auscultation bilaterally, normal work of breathing GI: soft, nontender, nondistended, + BS MS: no deformity or atrophy well-healed sternotomy incision, no evidence of infection or evisceration.  Left saphenous vein graft site is well-healed. Skin: warm and dry, no rash Neuro:  Strength and sensation are intact Psych: euthymic mood, full affect   EKG:  Not completed.   Recent Labs: 04/24/2018: Magnesium 2.1 05/10/2018: ALT 31; BUN 13; Creat 1.16; Hemoglobin 12.0; Platelets 690; Potassium 5.3; Sodium 136; TSH 3.66    Lipid Panel    Component Value Date/Time   CHOL 187 04/21/2018 0235   TRIG 86 04/21/2018 0235   HDL 45 04/21/2018 0235   CHOLHDL 4.2 04/21/2018 0235   VLDL 17 04/21/2018 0235   LDLCALC 125 (H) 04/21/2018 0235      Wt Readings from Last 3 Encounters:  05/16/18 201 lb 9.6 oz (91.4 kg)  05/10/18 200 lb (90.7 kg)  04/28/18 206 lb 9.1 oz (93.7 kg)      Other studies Reviewed: Cardiac Cath 04/21/2018  Mid RCA lesion is 90% stenosed.  Post Atrio lesion is 50% stenosed.  Mid Cx lesion is 95% stenosed.  Prox LAD lesion is 75% stenosed.  The left ventricular systolic function is normal.  LV end diastolic pressure is normal.  The left ventricular ejection fraction is 55-65% by visual estimate.  ASSESSMENT AND PLAN:  1.  Coronary artery disease: Status post three-vessel coronary artery bypass grafting on 04/25/2018.  The patient is recovering well without significant complaints other than soreness.  I have answered multiple questions concerning his postoperative recovery.  I have also answer questions concerning his medication regimen.  The patient is willing to participate in cardiac rehab and a referral was sent he will be followed by CVTS in a couple of weeks for further postoperative assessment.  2.  Postoperative atrial  fibrillation: The patient remains on amiodarone and is now reduce the dose to 200 mg daily.  He had forgotten to do so and did continue amiodarone at 200 mg twice daily for approximately 2 weeks.  I will stop the amiodarone on June 03, 2018 for full 6-week course.  This is been explained to him.  If seen by CVTS they may wish to stop him sooner as he has remained in normal sinus rhythm.  3. Tobacco abuse: He has stopped smoking for one week.   4. Hypercholesterolemia; Continue statin therapy.   5. R/O OSA:  He has daytime sleepiness, his wife states that he stops breathing at night and she has to shake him to cause him to breath. He also snores heavily. Will plan sleep study for further evaluation.   Current medicines are reviewed at length with the patient today.    Labs/ tests ordered today include: None phone keeps going off  Bettey Mare. Liborio Nixon, ANP, AACC   05/16/2018 12:59 PM    [facility] [address] [place], [place] [address] Phone: [phone]; Fax: [phone]

## 2018-05-16 ENCOUNTER — Telehealth: Payer: Self-pay | Admitting: *Deleted

## 2018-05-16 ENCOUNTER — Encounter: Payer: Self-pay | Admitting: Adult Health

## 2018-05-16 ENCOUNTER — Ambulatory Visit (INDEPENDENT_AMBULATORY_CARE_PROVIDER_SITE_OTHER): Payer: 59 | Admitting: Adult Health

## 2018-05-16 VITALS — BP 122/64 | HR 59 | Ht 69.0 in | Wt 201.6 lb

## 2018-05-16 DIAGNOSIS — I214 Non-ST elevation (NSTEMI) myocardial infarction: Secondary | ICD-10-CM | POA: Diagnosis not present

## 2018-05-16 DIAGNOSIS — R400 Somnolence: Secondary | ICD-10-CM | POA: Diagnosis not present

## 2018-05-16 DIAGNOSIS — I2510 Atherosclerotic heart disease of native coronary artery without angina pectoris: Secondary | ICD-10-CM | POA: Diagnosis not present

## 2018-05-16 DIAGNOSIS — E7800 Pure hypercholesterolemia, unspecified: Secondary | ICD-10-CM

## 2018-05-16 DIAGNOSIS — R0683 Snoring: Secondary | ICD-10-CM | POA: Diagnosis not present

## 2018-05-16 MED ORDER — LISINOPRIL 2.5 MG PO TABS: 2.5000 mg | ORAL_TABLET | Freq: Every day | ORAL | 1 refills | Status: DC

## 2018-05-16 MED ORDER — AMIODARONE HCL 200 MG PO TABS: 200.0000 mg | ORAL_TABLET | Freq: Every day | ORAL | 0 refills | Status: DC

## 2018-05-16 MED ORDER — ATORVASTATIN CALCIUM 80 MG PO TABS: 80.0000 mg | ORAL_TABLET | Freq: Every day | ORAL | 1 refills | Status: DC

## 2018-05-16 MED ORDER — METOPROLOL TARTRATE 25 MG PO TABS: 12.5000 mg | ORAL_TABLET | Freq: Two times a day (BID) | ORAL | 1 refills | Status: DC

## 2018-05-16 NOTE — Telephone Encounter (Signed)
-----   Message from Alyson Ingles, LPN sent at 7/51/7001 11:21 AM EDT ----- SLEEP STUDY

## 2018-05-16 NOTE — Patient Instructions (Signed)
Medication Instructions:  TAKE AMIODARONE DAILY THEN STOP 06-04-2018  If you need a refill on your cardiac medications before your next appointment, please call your pharmacy.  Special Instructions: OK TO START CARDIAC REHAB  Follow-Up: Your physician wants you to follow-up in: 3 MONTHS WITH DR Jens Som    Thank you for choosing [facility] at [facility]!!

## 2018-05-16 NOTE — Telephone Encounter (Signed)
Pa request for sleep study sent to [facility] via web portal and web portal.

## 2018-05-17 ENCOUNTER — Encounter: Payer: Self-pay | Admitting: Adult Health

## 2018-05-17 ENCOUNTER — Telehealth (HOSPITAL_COMMUNITY): Payer: Self-pay

## 2018-05-17 NOTE — Telephone Encounter (Signed)
Called patient to schedule Cardiac Rehab - Scheduled orientation on 06/23/18 at 8:30am. Patient will attend the 8:15am exc class. Mailed packet.

## 2018-05-22 ENCOUNTER — Encounter: Payer: Self-pay | Admitting: Internal Medicine

## 2018-05-22 ENCOUNTER — Encounter: Payer: Self-pay | Admitting: Adult Health

## 2018-05-22 NOTE — Patient Instructions (Addendum)
It was a pleasure to see you today.  Continue current medications.  Return in September for follow-up labs.  At that time we will draw CBC, vitamin D level, lipid panel, liver functions, hemoglobin A1c.

## 2018-05-23 ENCOUNTER — Ambulatory Visit (INDEPENDENT_AMBULATORY_CARE_PROVIDER_SITE_OTHER): Payer: 59 | Admitting: Internal Medicine

## 2018-05-23 ENCOUNTER — Ambulatory Visit
Admission: RE | Admit: 2018-05-23 | Discharge: 2018-05-23 | Disposition: A | Discharge status: Home / Self Care | Payer: 59 | Source: Ambulatory Visit | Attending: Internal Medicine | Admitting: Internal Medicine

## 2018-05-23 ENCOUNTER — Encounter: Payer: Self-pay | Admitting: Internal Medicine

## 2018-05-23 VITALS — BP 110/70 | HR 67 | Temp 98.1°F | Ht 69.0 in | Wt 201.0 lb

## 2018-05-23 DIAGNOSIS — I252 Old myocardial infarction: Secondary | ICD-10-CM | POA: Diagnosis not present

## 2018-05-23 DIAGNOSIS — R059 Cough, unspecified: Secondary | ICD-10-CM

## 2018-05-23 DIAGNOSIS — R05 Cough: Secondary | ICD-10-CM

## 2018-05-23 DIAGNOSIS — J22 Unspecified acute lower respiratory infection: Secondary | ICD-10-CM

## 2018-05-23 DIAGNOSIS — R0902 Hypoxemia: Secondary | ICD-10-CM | POA: Diagnosis not present

## 2018-05-23 DIAGNOSIS — Z951 Presence of aortocoronary bypass graft: Secondary | ICD-10-CM | POA: Diagnosis not present

## 2018-05-23 DIAGNOSIS — R911 Solitary pulmonary nodule: Secondary | ICD-10-CM

## 2018-05-23 LAB — CBC WITH DIFFERENTIAL/PLATELET
Basophils Absolute: 55 cells/uL (ref 0–200)
Basophils Relative: 0.6 %
EOS ABS: 1128 {cells}/uL — AB (ref 15–500)
Eosinophils Relative: 12.4 %
HEMATOCRIT: 34.9 % — AB (ref 38.5–50.0)
Hemoglobin: 12.1 g/dL — ABNORMAL LOW (ref 13.2–17.1)
LYMPHS ABS: 2958 {cells}/uL (ref 850–3900)
MCH: 29.3 pg (ref 27.0–33.0)
MCHC: 34.7 g/dL (ref 32.0–36.0)
MCV: 84.5 fL (ref 80.0–100.0)
MPV: 9.2 fL (ref 7.5–12.5)
Monocytes Relative: 9.9 %
NEUTROS PCT: 44.6 %
Neutro Abs: 4059 cells/uL (ref 1500–7800)
PLATELETS: 402 10*3/uL — AB (ref 140–400)
RBC: 4.13 10*6/uL — ABNORMAL LOW (ref 4.20–5.80)
RDW: 13 % (ref 11.0–15.0)
TOTAL LYMPHOCYTE: 32.5 %
WBC: 9.1 10*3/uL (ref 3.8–10.8)
WBCMIX: 901 {cells}/uL (ref 200–950)

## 2018-05-23 MED ORDER — DOXYCYCLINE HYCLATE 100 MG PO TABS: 100.0000 mg | ORAL_TABLET | Freq: Two times a day (BID) | ORAL | 0 refills | Status: DC

## 2018-05-23 MED ORDER — ALBUTEROL SULFATE HFA 108 (90 BASE) MCG/ACT IN AERS: 2.0000 | INHALATION_SPRAY | Freq: Four times a day (QID) | RESPIRATORY_TRACT | 2 refills | Status: DC | PRN

## 2018-05-23 MED ORDER — OXYCODONE HCL 5 MG PO TABS: 5.0000 mg | ORAL_TABLET | Freq: Four times a day (QID) | ORAL | 0 refills | Status: DC | PRN

## 2018-05-23 MED ORDER — IPRATROPIUM-ALBUTEROL 0.5-2.5 (3) MG/3ML IN SOLN
3.0000 mL | Freq: Once | RESPIRATORY_TRACT | Status: AC
  Administered 2018-05-23: 3 mL via RESPIRATORY_TRACT

## 2018-05-23 MED ORDER — LISINOPRIL 2.5 MG PO TABS: 2.5000 mg | ORAL_TABLET | Freq: Every day | ORAL | 1 refills | Status: DC

## 2018-05-23 MED ORDER — BENZONATATE 100 MG PO CAPS: 100.0000 mg | ORAL_CAPSULE | Freq: Three times a day (TID) | ORAL | 0 refills | Status: DC | PRN

## 2018-05-23 MED ORDER — METOPROLOL TARTRATE 25 MG PO TABS: 12.5000 mg | ORAL_TABLET | Freq: Two times a day (BID) | ORAL | 1 refills | Status: DC

## 2018-05-23 MED ORDER — ATORVASTATIN CALCIUM 80 MG PO TABS: 80.0000 mg | ORAL_TABLET | Freq: Every day | ORAL | 1 refills | Status: DC

## 2018-05-23 MED ORDER — IOPAMIDOL (ISOVUE-370) INJECTION 76%
75.0000 mL | Freq: Once | INTRAVENOUS | Status: AC | PRN
  Administered 2018-05-23: 75 mL via INTRAVENOUS

## 2018-05-23 NOTE — Patient Instructions (Addendum)
To have stat chest x-ray and CBC with diff.  To have CT angios to rule out pulmonary embolus/or occult pneumonia  Tessalon perles 100 mg 3 times daily as needed for cough, Doxycycline 100 mg twice daily for 10 days, albuterol inhaler 2 sprays po 4 times daily until improved

## 2018-05-23 NOTE — Addendum Note (Signed)
Addended by: Gregery Na on: 05/23/2018 05:31 PM   Modules accepted: Orders

## 2018-05-23 NOTE — Addendum Note (Signed)
Addended by: Margaree Mackintosh on: 05/23/2018 12:35 PM   Modules accepted: Orders

## 2018-05-23 NOTE — Addendum Note (Signed)
Addended by: Margaree Mackintosh on: 05/23/2018 02:40 PM   Modules accepted: Orders

## 2018-05-23 NOTE — Progress Notes (Addendum)
   Subjective:    Patient ID: Zachary [name], male    DOB: 1961/07/21, 57 y.o.   MRN: [id number]  HPI  Received e-chart message last evening  regarding pt. who is 57 year old White Male  S/P  CABG June 29  after NSTEMI June 26  having developed a dry cough on Friday, July 26.  He is here today at my request for evaluation. No fever or chills. Still has sternotomy pain at times. BP has been stable.  Of concern is that O2 sat drops with ambulation down hall in office just a short distance from 97% to 90 %.   Review of Systems dry non productive hacking cough. Is on low dose lisinopril. No chest pain. Walking 2 miles daily and no c/o of SOB.     Objective:   Physical Exam No JVD at 45 degrees.  Chest is clear to auscultation.  No lower extremity edema.  TMs and pharynx are clear.  Sternotomy incision clear without drainage       Assessment & Plan:  Status post CABG and NSTEMI  Dry cough with O2 desat walking short distance  Plan: To have stat chest x-ray and CBC with differential with further instructions to follow.  Addendum: Chest x-ray is negative.  CBC shows eosinophilia but normal white blood cell count.  He is going to have CT of chest angios to make sure he does not have PE.  He was given nebulizer treatment in office which seemed to help some.  O2 sat improved and stabilized at 96% walking down hall.  Addendum: CT angio shows no evidence of PE or pneumonia but there is 18mm nodule that needs follow up in 6- 12 months.  Call in Doxycycline 100mg  bid x 10 days, albuterol inhaler 2 sprays po 4 times a day and Tessalon perles 100 mg 3 times a day for cough.

## 2018-05-23 NOTE — Addendum Note (Signed)
Addended by: Margaree Mackintosh on: 05/23/2018 12:00 PM   Modules accepted: Orders

## 2018-05-31 ENCOUNTER — Other Ambulatory Visit: Payer: Self-pay | Admitting: Cardiothoracic Surgery

## 2018-05-31 ENCOUNTER — Other Ambulatory Visit: Payer: Self-pay | Admitting: Adult Health

## 2018-05-31 DIAGNOSIS — R400 Somnolence: Secondary | ICD-10-CM

## 2018-05-31 DIAGNOSIS — R0683 Snoring: Secondary | ICD-10-CM

## 2018-05-31 DIAGNOSIS — R0689 Other abnormalities of breathing: Secondary | ICD-10-CM

## 2018-05-31 DIAGNOSIS — Z951 Presence of aortocoronary bypass graft: Secondary | ICD-10-CM

## 2018-06-01 ENCOUNTER — Telehealth: Payer: Self-pay | Admitting: *Deleted

## 2018-06-01 NOTE — Telephone Encounter (Signed)
-----   Message from Jodelle Gross, NP sent at 05/31/2018  4:33 PM EDT ----- [place] for home sleep study.   ----- Message ----- From: Gaynelle Cage, CMA Sent: 05/31/2018   2:43 PM To: Jodelle Gross, NP  Received a call from [facility]. They denied the request for in lab sleep study.  Ok to have HST or can do peer to peer by calling. [phone] ----- Message ----- From: Alyson Ingles, LPN Sent: 11/22/7865  11:21 AM To: Cv Div Sleep Studies  SLEEP STUDY

## 2018-06-01 NOTE — Telephone Encounter (Signed)
Left message to return a call to inform of sleep study appointment. 

## 2018-06-01 NOTE — Telephone Encounter (Signed)
Patient returned a call to me and was notified of his HST appointment details.

## 2018-06-02 ENCOUNTER — Ambulatory Visit (INDEPENDENT_AMBULATORY_CARE_PROVIDER_SITE_OTHER): Payer: Self-pay | Admitting: Cardiothoracic Surgery

## 2018-06-02 ENCOUNTER — Encounter: Payer: Self-pay | Admitting: Cardiothoracic Surgery

## 2018-06-02 ENCOUNTER — Other Ambulatory Visit: Payer: Self-pay

## 2018-06-02 VITALS — BP 144/83 | HR 74 | Resp 16 | Ht 69.0 in | Wt 197.0 lb

## 2018-06-02 DIAGNOSIS — Z951 Presence of aortocoronary bypass graft: Secondary | ICD-10-CM

## 2018-06-02 DIAGNOSIS — I214 Non-ST elevation (NSTEMI) myocardial infarction: Secondary | ICD-10-CM

## 2018-06-02 DIAGNOSIS — I249 Acute ischemic heart disease, unspecified: Secondary | ICD-10-CM

## 2018-06-02 NOTE — Progress Notes (Signed)
[address]       [place] [address]             [phone]      Lamari Beckles [facility] Medical Record #[id number] Date of Birth: 12-Jan-1961  Referring: Lewayne Bunting, MD Primary Care: Margaree Mackintosh, MD Primary Cardiologist: No primary care provider on file.   Chief Complaint:   POST OP FOLLOW UP 04/23/2018 PREOPERATIVE DIAGNOSIS:  Emergency coronary artery bypass grafting with recent subendocardial myocardial infarction. 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 SURGICAL PROCEDURE:  Emergency coronary artery bypass grafting x3 with left internal mammary to the left anterior descending coronary artery, reverse saphenous vein graft to the posterior descending coronary artery, reverse saphenous vein graft to the  distal circumflex coronary artery with right thigh greater saphenous vein harvesting.  History of Present Illness:     Patient progressing well following recent emergency coronary artery bypass grafting.  He did have some dry cough and shortness of breath was evaluated with a CT scan of the chest to rule out PE which was negative for evidence of pulmonary emboli.  His and symptoms have continued to improve.  His cough is resolved, he is remained active without further shortness of breath.  He is waiting to start in cardiac rehab.   Past Medical History:  Diagnosis Date  . Hyperlipemia      Social History   Tobacco Use  Smoking Status Former Smoker  . Types: Cigarettes  . Start date: 10/26/1982  . Last attempt to quit: 04/20/2018  . Years since quitting: 0.1  Smokeless Tobacco Never Used  Tobacco Comment   smoked about 20 cigarrets every 2 days.     Social History   Substance and Sexual Activity  Alcohol Use Yes   Comment: socially      No Known Allergies  Current Outpatient Medications  Medication Sig Dispense Refill  . albuterol (PROVENTIL  HFA;VENTOLIN HFA) 108 (90 Base) MCG/ACT inhaler Inhale 2 puffs into the lungs every 6 (six) hours as needed for wheezing or shortness of breath. 1 Inhaler 2  . amiodarone (PACERONE) 200 MG tablet Take 1 tablet (200 mg total) by mouth daily. 20 tablet 0  . aspirin EC 325 MG EC tablet Take 1 tablet (325 mg total) by mouth daily.    Marland Kitchen atorvastatin (LIPITOR) 80 MG tablet Take 1 tablet (80 mg total) by mouth daily at 6 PM. 30 tablet 1  . benzonatate (TESSALON) 100 MG capsule Take 1 capsule (100 mg total) by mouth 3 (three) times daily as needed for cough. 30 capsule 0  . lisinopril (PRINIVIL,ZESTRIL) 2.5 MG tablet Take 1 tablet (2.5 mg total) by mouth daily. 30 tablet 1  . metoprolol tartrate (LOPRESSOR) 25 MG tablet Take 0.5 tablets (12.5 mg total) by mouth 2 (two) times daily. 30 tablet 1  . oxyCODONE (OXY IR/ROXICODONE) 5 MG immediate release tablet Take 1 tablet (5 mg total) by mouth every 6 (six) hours as needed for severe pain. 30 tablet 0   No current facility-administered medications for this visit.        Physical Exam: BP (!) 144/83 (BP Location: Right Arm, Patient Position: Sitting, Cuff Size: Normal)   Pulse 74   Resp 16   Ht 5\' 9"  (1.753 m)   Wt 197 lb (89.4 kg)   SpO2 98% Comment: ON RA  BMI 29.09 kg/m   General appearance: alert and cooperative Neurologic: intact  Heart: regular rate and rhythm, S1, S2 normal, no murmur, click, rub or gallop Lungs: clear to auscultation bilaterally Abdomen: soft, non-tender; bowel sounds normal; no masses,  no organomegaly Extremities: extremities normal, atraumatic, no cyanosis or edema and Homans sign is negative, no sign of DVT Wound: Sternum is stable and healing well   Diagnostic Studies & Laboratory data:     Recent Radiology Findings:  Dg Chest 2 View  Result Date: 05/23/2018 CLINICAL DATA:  Onset cough 2 days prior / abated yesterday / concern for infiltrate / jdh 315 EXAM: CHEST - 2 VIEW COMPARISON:  04/26/2018 FINDINGS:  Median sternotomy and CABG. The heart is normal in size. There are no focal consolidations or pleural effusions. No pulmonary edema. There is midthoracic spondylosis. IMPRESSION: No active cardiopulmonary disease. Electronically Signed   By: Norva Pavlov M.D.   On: 05/23/2018 10:13   Ct Angio Chest W/cm &/or Wo Cm  Result Date: 05/23/2018 CLINICAL DATA:  Hypoxemia, cough.  Recent CABG.  Pulmonary embolism? EXAM: CT ANGIOGRAPHY CHEST WITH CONTRAST TECHNIQUE: Multidetector CT imaging of the chest was performed using the standard protocol during bolus administration of intravenous contrast. Multiplanar CT image reconstructions and MIPs were obtained to evaluate the vascular anatomy. CONTRAST:  71mL ISOVUE-370 IOPAMIDOL (ISOVUE-370) INJECTION 76% COMPARISON:  None. FINDINGS: Cardiovascular: There is no pulmonary embolism identified within the main, lobar or segmental pulmonary arteries bilaterally. Surgical changes of CABG. No pericardial effusion. No thoracic aortic aneurysm or evidence of aortic dissection. Mediastinum/Nodes: No hemorrhage or significant edema within the mediastinum. No mass or enlarged lymph nodes within the mediastinum or perihilar regions. Esophagus appears normal. Trachea and central bronchi are unremarkable. Lungs/Pleura: Biapical emphysematous change, mild to moderate in degree. Chronic scarring within the RIGHT upper lung. Subpleural nodule along the RIGHT major fissure measures 6 mm (series 7, image 76), suspected additional nodular scarring. No pneumonia or pulmonary edema. No pleural effusion or pneumothorax. Upper Abdomen: No acute abnormality. Musculoskeletal: Mild degenerative spondylosis of the thoracic spine with associated kyphosis. No acute or suspicious osseous finding. Review of the MIP images confirms the above findings. IMPRESSION: 1. No acute findings. No pulmonary embolism. No pneumonia or pulmonary edema. No pericardial effusion. No evidence of surgical complicating  feature related to the given history of recent CABG. 2. Biapical emphysematous change, mild to moderate in degree. 3. Subpleural nodule along the RIGHT major fissure, measuring 6 mm, suspected nodular scarring or other benign etiology. Non-contrast chest CT at 6-12 months is recommended. If the nodule is stable at time of repeat CT, then future CT at 18-24 months (from today's scan) is considered optional for low-risk patients, but is recommended for high-risk patients. This recommendation follows the consensus statement: Guidelines for Management of Incidental Pulmonary Nodules Detected on CT Images: From the Fleischner Society 2017; Radiology 2017; 284:228-243. Emphysema (ICD10-J43.9). Electronically Signed   By: Bary Richard M.D.   On: 05/23/2018 14:11    Recent Lab Findings: Lab Results  Component Value Date   WBC 9.1 05/23/2018   HGB 12.1 (L) 05/23/2018   HCT 34.9 (L) 05/23/2018   PLT 402 (H) 05/23/2018   GLUCOSE 119 05/10/2018   CHOL 187 04/21/2018   TRIG 86 04/21/2018   HDL 45 04/21/2018   LDLCALC 125 (H) 04/21/2018   ALT 31 05/10/2018   AST 15 05/10/2018   NA 136 05/10/2018   K 5.3 05/10/2018   CL 98 05/10/2018   CREATININE 1.16 05/10/2018   BUN 13 05/10/2018   CO2 29 05/10/2018  TSH 3.66 05/10/2018   INR 1.30 04/23/2018   HGBA1C 5.9 (H) 04/23/2018      Assessment / Plan:      Patient patient making good progress postoperatively, returning to near normal activities he had no recurrent angina or evidence of congestive heart failure.   We have allowed him to return to driving, his work does involve lifting and traveling so he will wait 3 months before trying to return to work.  Will address with cardiology should he be on long-term Plavix.  He and his wife had their questions answered your return in 6 weeks for follow-up visit  Waiting for availability in cardiac rehab in the next several weeks      Delight Ovens MD      [address] [place] [place] [address] Office [phone]   Beeper [phone]  06/02/2018 12:52 PM

## 2018-06-03 ENCOUNTER — Telehealth: Payer: Self-pay

## 2018-06-03 NOTE — Telephone Encounter (Signed)
-----   Message from [name], RN sent at 06/03/2018 10:01 AM EDT ----- Zachary [name],   Can you call this patient and tell him Dr. Tyrone Sage said he can stop his amiodarone now?  Thanks, [employer]

## 2018-06-03 NOTE — Telephone Encounter (Signed)
Called patient and made aware he can stop his Amiodarone.  Patient acknowledged receipt.

## 2018-06-14 ENCOUNTER — Other Ambulatory Visit: Payer: Self-pay | Admitting: Internal Medicine

## 2018-06-21 ENCOUNTER — Telehealth (HOSPITAL_COMMUNITY): Payer: Self-pay

## 2018-06-21 NOTE — Telephone Encounter (Signed)
Cardiac Rehab Medication Review by a Pharmacist  Does the patient  feel that his/her medications are working for him/her?  yes  Has the patient been experiencing any side effects to the medications prescribed?  no  Does the patient measure his/her own blood pressure or blood glucose at home?  yes   Does the patient have any problems obtaining medications due to transportation or finances?   no  Understanding of regimen: good Understanding of indications: good Potential of compliance: good  Pharmacist comments:  Patient states amiodarone was stopped by Cardiology at last appointment (06/02/18)  Changed ASA 325 to 81 mg in profile to reflect what patient states he takes  Added Tylenol PRN for pain/headaches  Pt states home BP runs 110-130/60-70  Danae Orleans, PharmD PGY1 Pharmacy Resident Phone [phone] 06/21/2018       10:15 AM

## 2018-06-23 ENCOUNTER — Encounter (HOSPITAL_COMMUNITY): Payer: Self-pay

## 2018-06-23 ENCOUNTER — Encounter (HOSPITAL_COMMUNITY)
Admission: RE | Admit: 2018-06-23 | Discharge: 2018-06-23 | Disposition: A | Discharge status: Home / Self Care | Payer: 59 | Source: Ambulatory Visit | Attending: Cardiology | Admitting: Cardiology

## 2018-06-23 VITALS — Ht 68.5 in | Wt 201.3 lb

## 2018-06-23 DIAGNOSIS — L817 Pigmented purpuric dermatosis: Secondary | ICD-10-CM | POA: Diagnosis not present

## 2018-06-23 DIAGNOSIS — Z87891 Personal history of nicotine dependence: Secondary | ICD-10-CM | POA: Insufficient documentation

## 2018-06-23 DIAGNOSIS — E785 Hyperlipidemia, unspecified: Secondary | ICD-10-CM | POA: Diagnosis not present

## 2018-06-23 DIAGNOSIS — Z7982 Long term (current) use of aspirin: Secondary | ICD-10-CM | POA: Diagnosis not present

## 2018-06-23 DIAGNOSIS — Z951 Presence of aortocoronary bypass graft: Secondary | ICD-10-CM | POA: Insufficient documentation

## 2018-06-23 DIAGNOSIS — Z79899 Other long term (current) drug therapy: Secondary | ICD-10-CM | POA: Insufficient documentation

## 2018-06-23 DIAGNOSIS — Z79891 Long term (current) use of opiate analgesic: Secondary | ICD-10-CM | POA: Insufficient documentation

## 2018-06-23 DIAGNOSIS — L821 Other seborrheic keratosis: Secondary | ICD-10-CM | POA: Diagnosis not present

## 2018-06-23 NOTE — Progress Notes (Signed)
Zachary [name] 57 y.o. male DOB: 1961-09-30 MRN: [id number]      Nutrition Note  No diagnosis found. Past Medical History:  Diagnosis Date  . Hyperlipemia    Meds reviewed. Atorvastatin, lopressor, oxycodone noted  HT: Ht Readings from Last 1 Encounters:  06/02/18 5\' 9"  (1.753 m)    WT: Wt Readings from Last 5 Encounters:  06/02/18 197 lb (89.4 kg)  05/23/18 201 lb (91.2 kg)  05/16/18 201 lb 9.6 oz (91.4 kg)  05/10/18 200 lb (90.7 kg)  04/28/18 206 lb 9.1 oz (93.7 kg)     There is no height or weight on file to calculate BMI.   Current tobacco use? No  Labs:  Lipid Panel     Component Value Date/Time   CHOL 187 04/21/2018 0235   TRIG 86 04/21/2018 0235   HDL 45 04/21/2018 0235   CHOLHDL 4.2 04/21/2018 0235   VLDL 17 04/21/2018 0235   LDLCALC 125 (H) 04/21/2018 0235    Lab Results  Component Value Date   HGBA1C 5.9 (H) 04/23/2018   CBG (last 3)  No results for input(s): GLUCAP in the last 72 hours.  Nutrition Note Spoke with pt. Nutrition plan and goals reviewed with pt. Pt is following Step 2 of the Therapeutic Lifestyle Changes diet. Pt wants to lose wt. Pt has not been trying to lose wt actively. Wt loss tips reviewed (label reading, how to build a healthy plate, portion sizes, eating frequently across the day). Pt shared he drinks sweet tea daily 1-2x day uses sugar in his coffee, recommended pt decrease sugar sweetened beverages as this can raise blood glucose levels. Pt is pre-diabetic. Last A1c 5.9, recommended pt swap refined carbohydrates for complex carbohydrates to manage blood glucose. Per discussion, pt does not use canned/convenience foods often. Pt rarely adds salt to food. Pt eats out 3-4x a week.   Pt expressed understanding of the information reviewed. Pt aware of nutrition education classes offered and plans on attending nutrition classes.  Nutrition Diagnosis ? Food-and nutrition-related knowledge deficit related to lack of exposure to information as  related to diagnosis of: ? CVD ? Pre-DM  Nutrition Intervention ? Pt's individual nutrition plan and goals reviewed with pt.  Nutrition Goal(s):   ? Pt to identify and limit food sources of saturated fat, trans fat, refined carbohydrates and sodium ? Pt to identify food quantities necessary to achieve weight loss of 6-24 lbs. at graduation from cardiac rehab. Goal wt of 185 lb desired.  ? Pt able to name foods that affect blood glucose.   Plan:  ? Pt to attend nutrition classes ? Nutrition I ? Nutrition II ? Portion Distortion  ? Diabetes Blitz ? Diabetes Q & Ae determined ? Will provide client-centered nutrition education as part of interdisciplinary care ? Monitor and evaluate progress toward nutrition goal with team.   Ross Marcus, MS, RD, LDN 06/23/2018 8:45 AM

## 2018-06-23 NOTE — Progress Notes (Signed)
Cardiac Individual Treatment Plan  Patient Details  Name: Zachary [name] MRN: [id number] Date of Birth: 09/13/1961 Referring Provider:     CARDIAC REHAB PHASE II ORIENTATION from 06/23/2018 in [facility] CARDIAC [facility]  Referring Provider  Lewayne Bunting MD       Initial Encounter Date:    CARDIAC REHAB PHASE II ORIENTATION from 06/23/2018 in [facility] CARDIAC REHAB  Date  06/23/18      Visit Diagnosis: S/P CABG x 3  Patient's Home Medications on Admission:  Current Outpatient Medications:  .  acetaminophen (TYLENOL) 325 MG tablet, Take 325 mg by mouth every 6 (six) hours as needed., Disp: , Rfl:  .  albuterol (PROVENTIL HFA;VENTOLIN HFA) 108 (90 Base) MCG/ACT inhaler, Inhale 2 puffs into the lungs every 6 (six) hours as needed for wheezing or shortness of breath., Disp: 1 Inhaler, Rfl: 2 .  amiodarone (PACERONE) 200 MG tablet, Take 1 tablet (200 mg total) by mouth daily. (Patient not taking: Reported on 06/21/2018), Disp: 20 tablet, Rfl: 0 .  aspirin EC 325 MG EC tablet, Take 1 tablet (325 mg total) by mouth daily., Disp: , Rfl:  .  aspirin EC 81 MG tablet, Take 81 mg by mouth daily., Disp: , Rfl:  .  atorvastatin (LIPITOR) 80 MG tablet, Take 1 tablet (80 mg total) by mouth daily at 6 PM., Disp: 30 tablet, Rfl: 1 .  benzonatate (TESSALON) 100 MG capsule, Take 1 capsule (100 mg total) by mouth 3 (three) times daily as needed for cough. (Patient not taking: Reported on 06/21/2018), Disp: 30 capsule, Rfl: 0 .  lisinopril (PRINIVIL,ZESTRIL) 2.5 MG tablet, Take 1 tablet (2.5 mg total) by mouth daily., Disp: 30 tablet, Rfl: 1 .  metoprolol tartrate (LOPRESSOR) 25 MG tablet, TAKE 0.5 TABLETS (12.5 MG TOTAL) BY MOUTH 2 (TWO) TIMES DAILY., Disp: 90 tablet, Rfl: 1 .  oxyCODONE (OXY IR/ROXICODONE) 5 MG immediate release tablet, Take 1 tablet (5 mg total) by mouth every 6 (six) hours as needed for severe pain. (Patient not taking: Reported on 06/21/2018), Disp: 30  tablet, Rfl: 0  Past Medical History: Past Medical History:  Diagnosis Date  . Hyperlipemia     Tobacco Use: Social History   Tobacco Use  Smoking Status Former Smoker  . Types: Cigarettes  . Start date: 10/26/1982  . Last attempt to quit: 04/20/2018  . Years since quitting: 0.1  Smokeless Tobacco Never Used  Tobacco Comment   smoked about 20 cigarrets every 2 days.     Labs: Recent Review Flowsheet Data    Labs for ITP Cardiac and Pulmonary Rehab Latest Ref Rng & Units 04/23/2018 04/23/2018 04/23/2018 04/23/2018 04/24/2018   Cholestrol 0 - 200 mg/dL - - - - -   LDLCALC 0 - 99 mg/dL - - - - -   HDL >40 mg/dL - - - - -   Trlycerides <150 mg/dL - - - - -   Hemoglobin A1c 4.8 - 5.6 % - - - - -   PHART 7.350 - 7.450 7.367 7.343(L) 7.357 - -   PCO2ART 32.0 - 48.0 mmHg 40.8 43.2 43.0 - -   HCO3 20.0 - 28.0 mmol/L 23.6 23.6 24.2 - -   TCO2 22 - 32 mmol/L 25 25 26 22 23    ACIDBASEDEF 0.0 - 2.0 mmol/L 2.0 2.0 1.0 - -   O2SAT % 94.0 97.0 98.0 - -      Capillary Blood Glucose: Lab Results  Component Value Date  GLUCAP 122 (H) 04/27/2018   GLUCAP 159 (H) 04/26/2018   GLUCAP 125 (H) 04/26/2018   GLUCAP 67 (L) 04/26/2018   GLUCAP 136 (H) 04/26/2018     Exercise Target Goals: Exercise Program Goal: Individual exercise prescription set using results from initial 6 min walk test and THRR while considering  patient's activity barriers and safety.   Exercise Prescription Goal: Initial exercise prescription builds to 30-45 minutes a day of aerobic activity, 2-3 days per week.  Home exercise guidelines will be given to patient during program as part of exercise prescription that the participant will acknowledge.  Activity Barriers & Risk Stratification: Activity Barriers & Cardiac Risk Stratification - 06/23/18 1209      Activity Barriers & Cardiac Risk Stratification   Activity Barriers  None    Cardiac Risk Stratification  High       6 Minute Walk: 6 Minute Walk    Row Name  06/23/18 1208         6 Minute Walk   Phase  Initial     Distance  1652 feet     Walk Time  6 minutes     # of Rest Breaks  0     MPH  3.13     METS  3.85     RPE  10     Perceived Dyspnea   0     VO2 Peak  13.5     Symptoms  No     Resting HR  66 bpm     Resting BP  110/64     Resting Oxygen Saturation   98 %     Exercise Oxygen Saturation  during 6 min walk  99 %     Max Ex. HR  95 bpm     Max Ex. BP  120/70     2 Minute Post BP  128/74        Oxygen Initial Assessment:   Oxygen Re-Evaluation:   Oxygen Discharge (Final Oxygen Re-Evaluation):   Initial Exercise Prescription: Initial Exercise Prescription - 06/23/18 1200      Date of Initial Exercise RX and Referring Provider   Date  06/23/18    Referring Provider  Lewayne Bunting MD     Expected Discharge Date  09/16/18      Treadmill   MPH  3    Grade  0    Minutes  10    METs  3.3      Bike   Level  1    Minutes  10    METs  3.1      NuStep   Level  2    SPM  80    Minutes  10    METs  2.8      Prescription Details   Frequency (times per week)  3x    Duration  Progress to 30 minutes of continuous aerobic without signs/symptoms of physical distress      Intensity   THRR 40-80% of Max Heartrate  64-129    Ratings of Perceived Exertion  11-13    Perceived Dyspnea  0-4      Progression   Progression  Continue progressive overload as per policy without signs/symptoms or physical distress.      Resistance Training   Training Prescription  Yes    Weight  3lbs    Reps  10-15       Perform Capillary Blood Glucose checks as needed.  Exercise Prescription Changes:  Exercise Comments:   Exercise Goals and Review: Exercise Goals    Row Name 06/23/18 1209             Exercise Goals   Increase Physical Activity  Yes       Intervention  Provide advice, education, support and counseling about physical activity/exercise needs.;Develop an individualized exercise prescription for  aerobic and resistive training based on initial evaluation findings, risk stratification, comorbidities and participant's personal goals.       Expected Outcomes  Short Term: Attend rehab on a regular basis to increase amount of physical activity.;Long Term: Add in home exercise to make exercise part of routine and to increase amount of physical activity.;Long Term: Exercising regularly at least 3-5 days a week.       Increase Strength and Stamina  Yes       Intervention  Provide advice, education, support and counseling about physical activity/exercise needs.;Develop an individualized exercise prescription for aerobic and resistive training based on initial evaluation findings, risk stratification, comorbidities and participant's personal goals.       Expected Outcomes  Short Term: Increase workloads from initial exercise prescription for resistance, speed, and METs.;Long Term: Improve cardiorespiratory fitness, muscular endurance and strength as measured by increased METs and functional capacity ( );Short Term: Perform resistance training exercises routinely during rehab and add in resistance training at home       Able to understand and use rate of perceived exertion (RPE) scale  Yes       Intervention  Provide education and explanation on how to use RPE scale       Expected Outcomes  Short Term: Able to use RPE daily in rehab to express subjective intensity level;Long Term:  Able to use RPE to guide intensity level when exercising independently       Knowledge and understanding of Target Heart Rate Range (THRR)  Yes       Intervention  Provide education and explanation of THRR including how the numbers were predicted and where they are located for reference       Expected Outcomes  Short Term: Able to state/look up THRR;Long Term: Able to use THRR to govern intensity when exercising independently;Short Term: Able to use daily as guideline for intensity in rehab       Able to check pulse  independently  Yes       Intervention  Provide education and demonstration on how to check pulse in carotid and radial arteries.;Review the importance of being able to check your own pulse for safety during independent exercise       Expected Outcomes  Short Term: Able to explain why pulse checking is important during independent exercise;Long Term: Able to check pulse independently and accurately       Intervention  Provide education, explanation, and written materials on patient's individual exercise prescription       Expected Outcomes  Short Term: Able to explain program exercise prescription;Long Term: Able to explain home exercise prescription to exercise independently          Exercise Goals Re-Evaluation :   Discharge Exercise Prescription (Final Exercise Prescription Changes):   Nutrition:  Target Goals: Understanding of nutrition guidelines, daily intake of sodium 1500mg , cholesterol 200mg , calories 30% from fat and 7% or less from saturated fats, daily to have 5 or more servings of fruits and vegetables.  Biometrics: Pre Biometrics - 06/23/18 1210      Pre Biometrics   Height  5' 8.5" (1.74 m)  Weight  91.3 kg    Waist Circumference  39 inches    Hip Circumference  41 inches    Waist to Hip Ratio  0.95 %    BMI (Calculated)  30.16    Triceps Skinfold  12 mm    % Body Fat  26.7 %    Grip Strength  40 kg    Flexibility  15 in    Single Leg Stand  30 seconds        Nutrition Therapy Plan and Nutrition Goals: Nutrition Therapy & Goals - 06/23/18 1025      Nutrition Therapy   Diet  heart healthy carb modified  (Pended)        Nutrition Assessments:   Nutrition Goals Re-Evaluation:   Nutrition Goals Re-Evaluation:   Nutrition Goals Discharge (Final Nutrition Goals Re-Evaluation):   Psychosocial: Target Goals: Acknowledge presence or absence of significant depression and/or stress, maximize coping skills, provide positive support system. Participant is  able to verbalize types and ability to use techniques and skills needed for reducing stress and depression.  Initial Review & Psychosocial Screening: Initial Psych Review & Screening - 06/23/18 1053      Initial Review   Current issues with  None Identified      Family Dynamics   Good Support System?  Yes   Pt lists his wife, children, grandparents, and golf buddies as sources of support.      Barriers   Psychosocial barriers to participate in program  There are no identifiable barriers or psychosocial needs.      Screening Interventions   Interventions  Encouraged to exercise       Quality of Life Scores: Quality of Life - 06/23/18 1120      Quality of Life   Select  Quality of Life      Quality of Life Scores   Health/Function Pre  22.2 %    Socioeconomic Pre  22.5 %    Psych/Spiritual Pre  22.71 %    Family Pre  24.5 %    GLOBAL Pre  22.7 %      Scores of 19 and below usually indicate a poorer quality of life in these areas.  A difference of  2-3 points is a clinically meaningful difference.  A difference of 2-3 points in the total score of the Quality of Life Index has been associated with significant improvement in overall quality of life, self-image, physical symptoms, and general health in studies assessing change in quality of life.  PHQ-9: Recent Review Flowsheet Data    Depression screen [facility] 2/9 05/10/2018   Decreased Interest 0   Down, Depressed, Hopeless 0   PHQ - 2 Score 0     Interpretation of Total Score  Total Score Depression Severity:  1-4 = Minimal depression, 5-9 = Mild depression, 10-14 = Moderate depression, 15-19 = Moderately severe depression, 20-27 = Severe depression   Psychosocial Evaluation and Intervention:   Psychosocial Re-Evaluation:   Psychosocial Discharge (Final Psychosocial Re-Evaluation):   Vocational Rehabilitation: Provide vocational rehab assistance to qualifying candidates.   Vocational Rehab Evaluation &  Intervention: Vocational Rehab - 06/23/18 1121      Initial Vocational Rehab Evaluation & Intervention   Assessment shows need for Vocational Rehabilitation  No       Education: Education Goals: Education classes will be provided on a weekly basis, covering required topics. Participant will state understanding/return demonstration of topics presented.  Learning Barriers/Preferences: Learning Barriers/Preferences - 06/23/18 1245  Learning Barriers/Preferences   Geneticist, molecular Preferences  Skilled Demonstration       Education Topics: Count Your Pulse:  -Group instruction provided by verbal instruction, demonstration, patient participation and written materials to support subject.  Instructors address importance of being able to find your pulse and how to count your pulse when at home without a heart monitor.  Patients get hands on experience counting their pulse with staff help and individually.   Heart Attack, Angina, and Risk Factor Modification:  -Group instruction provided by verbal instruction, video, and written materials to support subject.  Instructors address signs and symptoms of angina and heart attacks.    Also discuss risk factors for heart disease and how to make changes to improve heart health risk factors.   Functional Fitness:  -Group instruction provided by verbal instruction, demonstration, patient participation, and written materials to support subject.  Instructors address safety measures for doing things around the house.  Discuss how to get up and down off the floor, how to pick things up properly, how to safely get out of a chair without assistance, and balance training.   Meditation and Mindfulness:  -Group instruction provided by verbal instruction, patient participation, and written materials to support subject.  Instructor addresses importance of mindfulness and meditation practice to help reduce stress and improve awareness.   Instructor also leads participants through a meditation exercise.    Stretching for Flexibility and Mobility:  -Group instruction provided by verbal instruction, patient participation, and written materials to support subject.  Instructors lead participants through series of stretches that are designed to increase flexibility thus improving mobility.  These stretches are additional exercise for major muscle groups that are typically performed during regular warm up and cool down.   Hands Only CPR:  -Group verbal, video, and participation provides a basic overview of AHA guidelines for community CPR. Role-play of emergencies allow participants the opportunity to practice calling for help and chest compression technique with discussion of AED use.   Hypertension: -Group verbal and written instruction that provides a basic overview of hypertension including the most recent diagnostic guidelines, risk factor reduction with self-care instructions and medication management.    Nutrition I class: Heart Healthy Eating:  -Group instruction provided by PowerPoint slides, verbal discussion, and written materials to support subject matter. The instructor gives an explanation and review of the Therapeutic Lifestyle Changes diet recommendations, which includes a discussion on lipid goals, dietary fat, sodium, fiber, plant stanol/sterol esters, sugar, and the components of a well-balanced, healthy diet.   Nutrition II class: Lifestyle Skills:  -Group instruction provided by PowerPoint slides, verbal discussion, and written materials to support subject matter. The instructor gives an explanation and review of label reading, grocery shopping for heart health, heart healthy recipe modifications, and ways to make healthier choices when eating out.   Diabetes Question & Answer:  -Group instruction provided by PowerPoint slides, verbal discussion, and written materials to support subject matter. The instructor  gives an explanation and review of diabetes co-morbidities, pre- and post-prandial blood glucose goals, pre-exercise blood glucose goals, signs, symptoms, and treatment of hypoglycemia and hyperglycemia, and foot care basics.   Diabetes Blitz:  -Group instruction provided by PowerPoint slides, verbal discussion, and written materials to support subject matter. The instructor gives an explanation and review of the physiology behind type 1 and type 2 diabetes, diabetes medications and rational behind using different medications, pre- and post-prandial blood glucose recommendations and Hemoglobin A1c goals, diabetes  diet, and exercise including blood glucose guidelines for exercising safely.    Portion Distortion:  -Group instruction provided by PowerPoint slides, verbal discussion, written materials, and food models to support subject matter. The instructor gives an explanation of serving size versus portion size, changes in portions sizes over the last 20 years, and what consists of a serving from each food group.   Stress Management:  -Group instruction provided by verbal instruction, video, and written materials to support subject matter.  Instructors review role of stress in heart disease and how to cope with stress positively.     Exercising on Your Own:  -Group instruction provided by verbal instruction, power point, and written materials to support subject.  Instructors discuss benefits of exercise, components of exercise, frequency and intensity of exercise, and end points for exercise.  Also discuss use of nitroglycerin and activating EMS.  Review options of places to exercise outside of rehab.  Review guidelines for sex with heart disease.   Cardiac Drugs I:  -Group instruction provided by verbal instruction and written materials to support subject.  Instructor reviews cardiac drug classes: antiplatelets, anticoagulants, beta blockers, and statins.  Instructor discusses reasons, side  effects, and lifestyle considerations for each drug class.   Cardiac Drugs II:  -Group instruction provided by verbal instruction and written materials to support subject.  Instructor reviews cardiac drug classes: angiotensin converting enzyme inhibitors (ACE-I), angiotensin II receptor blockers (ARBs), nitrates, and calcium channel blockers.  Instructor discusses reasons, side effects, and lifestyle considerations for each drug class.   Anatomy and Physiology of the Circulatory System:  Group verbal and written instruction and models provide basic cardiac anatomy and physiology, with the coronary electrical and arterial systems. Review of: AMI, Angina, Valve disease, Heart Failure, Peripheral Artery Disease, Cardiac Arrhythmia, Pacemakers, and the ICD.   Other Education:  -Group or individual verbal, written, or video instructions that support the educational goals of the cardiac rehab program.   Holiday Eating Survival Tips:  -Group instruction provided by PowerPoint slides, verbal discussion, and written materials to support subject matter. The instructor gives patients tips, tricks, and techniques to help them not only survive but enjoy the holidays despite the onslaught of food that accompanies the holidays.   Knowledge Questionnaire Score: Knowledge Questionnaire Score - 06/23/18 1120      Knowledge Questionnaire Score   Pre Score  22/24       Core Components/Risk Factors/Patient Goals at Admission: Personal Goals and Risk Factors at Admission - 06/23/18 1332      Core Components/Risk Factors/Patient Goals on Admission   Intervention  Weight Management: Develop a combined nutrition and exercise program designed to reach desired caloric intake, while maintaining appropriate intake of nutrient and fiber, sodium and fats, and appropriate energy expenditure required for the weight goal.;Weight Management/Obesity: Establish reasonable short term and long term weight goals.;Weight  Management: Provide education and appropriate resources to help participant work on and attain dietary goals.;Obesity: Provide education and appropriate resources to help participant work on and attain dietary goals.    Admit Weight  201 lb 4.5 oz (91.3 kg)    Goal Weight: Short Term  190 lb (86.2 kg)    Goal Weight: Long Term  185 lb (83.9 kg)    Expected Outcomes  Long Term: Adherence to nutrition and physical activity/exercise program aimed toward attainment of established weight goal;Short Term: Continue to assess and modify interventions until short term weight is achieved;Weight Loss: Understanding of general recommendations for a balanced deficit meal  plan, which promotes 1-2 lb weight loss per week and includes a negative energy balance of [id number] kcal/d;Understanding recommendations for meals to include 15-35% energy as protein, 25-35% energy from fat, 35-60% energy from carbohydrates, less than 200mg  of dietary cholesterol, 20-35 gm of total fiber daily;Understanding of distribution of calorie intake throughout the day with the consumption of 4-5 meals/snacks;Weight Gain: Understanding of general recommendations for a high calorie, high protein meal plan that promotes weight gain by distributing calorie intake throughout the day with the consumption for 4-5 meals, snacks, and/or supplements    Hypertension  Yes    Intervention  Provide education on lifestyle modifcations including regular physical activity/exercise, weight management, moderate sodium restriction and increased consumption of fresh fruit, vegetables, and low fat dairy, alcohol moderation, and smoking cessation.;Monitor prescription use compliance.    Expected Outcomes  Short Term: Continued assessment and intervention until BP is < 140/83mm HG in hypertensive participants. < 130/68mm HG in hypertensive participants with diabetes, heart failure or chronic kidney disease.;Long Term: Maintenance of blood pressure at goal levels.     Lipids  Yes    Intervention  Provide education and support for participant on nutrition & aerobic/resistive exercise along with prescribed medications to achieve LDL 70mg , HDL >40mg .    Expected Outcomes  Short Term: Participant states understanding of desired cholesterol values and is compliant with medications prescribed. Participant is following exercise prescription and nutrition guidelines.;Long Term: Cholesterol controlled with medications as prescribed, with individualized exercise RX and with personalized nutrition plan. Value goals: LDL < 70mg , HDL > 40 mg.       Core Components/Risk Factors/Patient Goals Review:    Core Components/Risk Factors/Patient Goals at Discharge (Final Review):    ITP Comments: ITP Comments    Row Name 06/23/18 1052           ITP Comments  Dr. Armanda Magic, Medical Director          Comments: Patient attended orientation from 0805 to [phone] to review rules and guidelines for program. Completed 6 minute walk test, Intitial ITP, and exercise prescription.  VSS. Telemetry-SR.  Asymptomatic.

## 2018-06-24 ENCOUNTER — Ambulatory Visit (HOSPITAL_BASED_OUTPATIENT_CLINIC_OR_DEPARTMENT_OTHER): Payer: 59 | Attending: Adult Health | Admitting: Cardiovascular Disease

## 2018-06-24 DIAGNOSIS — R0683 Snoring: Secondary | ICD-10-CM

## 2018-06-24 DIAGNOSIS — Z7982 Long term (current) use of aspirin: Secondary | ICD-10-CM | POA: Insufficient documentation

## 2018-06-24 DIAGNOSIS — Z79899 Other long term (current) drug therapy: Secondary | ICD-10-CM | POA: Diagnosis not present

## 2018-06-24 DIAGNOSIS — R400 Somnolence: Secondary | ICD-10-CM

## 2018-06-24 DIAGNOSIS — Z951 Presence of aortocoronary bypass graft: Secondary | ICD-10-CM | POA: Diagnosis not present

## 2018-06-24 DIAGNOSIS — G4733 Obstructive sleep apnea (adult) (pediatric): Secondary | ICD-10-CM | POA: Diagnosis not present

## 2018-06-24 DIAGNOSIS — R0689 Other abnormalities of breathing: Secondary | ICD-10-CM

## 2018-06-24 DIAGNOSIS — R0902 Hypoxemia: Secondary | ICD-10-CM | POA: Insufficient documentation

## 2018-06-28 ENCOUNTER — Other Ambulatory Visit: Payer: Self-pay | Admitting: Internal Medicine

## 2018-06-28 DIAGNOSIS — E7800 Pure hypercholesterolemia, unspecified: Secondary | ICD-10-CM

## 2018-06-28 DIAGNOSIS — D649 Anemia, unspecified: Secondary | ICD-10-CM

## 2018-06-28 DIAGNOSIS — R7302 Impaired glucose tolerance (oral): Secondary | ICD-10-CM

## 2018-06-28 DIAGNOSIS — Z5181 Encounter for therapeutic drug level monitoring: Secondary | ICD-10-CM

## 2018-06-28 DIAGNOSIS — E559 Vitamin D deficiency, unspecified: Secondary | ICD-10-CM

## 2018-06-28 DIAGNOSIS — Z79899 Other long term (current) drug therapy: Secondary | ICD-10-CM

## 2018-06-29 ENCOUNTER — Encounter (HOSPITAL_COMMUNITY): Payer: 59

## 2018-06-29 ENCOUNTER — Encounter (HOSPITAL_COMMUNITY): Payer: Self-pay

## 2018-06-29 ENCOUNTER — Encounter (HOSPITAL_COMMUNITY)
Admission: RE | Admit: 2018-06-29 | Discharge: 2018-06-29 | Disposition: A | Discharge status: Home / Self Care | Payer: 59 | Source: Ambulatory Visit | Attending: Cardiology | Admitting: Cardiology

## 2018-06-29 DIAGNOSIS — E785 Hyperlipidemia, unspecified: Secondary | ICD-10-CM | POA: Insufficient documentation

## 2018-06-29 DIAGNOSIS — Z951 Presence of aortocoronary bypass graft: Secondary | ICD-10-CM | POA: Insufficient documentation

## 2018-06-29 DIAGNOSIS — Z7982 Long term (current) use of aspirin: Secondary | ICD-10-CM | POA: Diagnosis not present

## 2018-06-29 DIAGNOSIS — Z79899 Other long term (current) drug therapy: Secondary | ICD-10-CM | POA: Diagnosis not present

## 2018-06-29 DIAGNOSIS — Z87891 Personal history of nicotine dependence: Secondary | ICD-10-CM | POA: Diagnosis not present

## 2018-06-29 DIAGNOSIS — Z79891 Long term (current) use of opiate analgesic: Secondary | ICD-10-CM | POA: Diagnosis not present

## 2018-06-29 NOTE — Progress Notes (Signed)
Daily Session Note  Patient Details  Name: Zachary [name] MRN: [id number] Date of Birth: 1961-03-04 Referring Provider:   Flowsheet Row CARDIAC REHAB PHASE II ORIENTATION from 06/23/2018 in [place]  Referring Provider  Lelon Perla MD       Encounter Date: 06/29/2018  Check In: Session Check In - 06/29/18 6767    Check-In          Supervising physician immediately available to respond to emergencies  [facility] immediately available    Physician(s)  Dr. Darrick Meigs    Location  [facility]    Staff Present  Jiles Garter, RN, Mosie Epstein, MS,ACSM CEP, Exercise Physiologist;Maria Whitaker, RN, BSN;Nikoleta Dady, RN, BSN    Medication changes reported      No    Fall or balance concerns reported     No    Tobacco Cessation  No Change    Warm-up and Cool-down  Performed as group-led Higher education careers adviser Performed  No    VAD Patient?  No    PAD/SET Patient?  No        Pain Assessment          Currently in Pain?  No/denies           Capillary Blood Glucose: No results found for this or any previous visit (from the past 24 hour(s)).  Exercise Prescription Changes - 06/29/18 1000    Response to Exercise          Blood Pressure (Admit)  114/64    Blood Pressure (Exercise)  138/78    Blood Pressure (Exit)  114/70    Heart Rate (Admit)  70 bpm    Heart Rate (Exercise)  97 bpm    Heart Rate (Exit)  70 bpm    Rating of Perceived Exertion (Exercise)  11    Perceived Dyspnea (Exercise)  0    Symptoms  None     Comments  Pt oriented to exercise equipment     Duration  Continue with 45 min of aerobic exercise without signs/symptoms of physical distress.    Intensity  THRR unchanged        Progression          Progression  Continue to progress workloads to maintain intensity without signs/symptoms of physical distress.    Average METs  2.95        Resistance Training          Training Prescription  No         Interval Training          Interval Training  No        Treadmill          MPH  3    Grade  0    Minutes  10    METs  3.3        Bike          Level  1    Minutes  10    METs  3.07        NuStep          Level  2    SPM  85    Minutes  10    METs  2.5           Social History   Tobacco Use  Smoking Status Former Smoker  . Types: Cigarettes  . Start date: 10/26/1982  . Last attempt to quit: 04/20/2018  .  Years since quitting: 0.1  Smokeless Tobacco Never Used  Tobacco Comment   smoked about 20 cigarrets every 2 days.     Goals Met:  Exercise tolerated well  Goals Unmet:  NA  Comments: Pt started cardiac rehab today.  Pt tolerated light exercise without difficulty. VSS, telemetry-sinus rhythm, asymptomatic.  Medication list reconciled. Pt denies barriers to medicaiton compliance.  PSYCHOSOCIAL ASSESSMENT:  PHQ-0. Marland Kitchen Pt exhibits positive coping skills, hopeful outlook with supportive family. No psychosocial needs identified at this time, no psychosocial interventions necessary.  Pt oriented to exercise equipment and routine. Understanding verbalized.     Dr. Fransico Him is Medical Director for Cardiac Rehab at [facility].

## 2018-06-30 ENCOUNTER — Other Ambulatory Visit: Payer: 59 | Admitting: Internal Medicine

## 2018-06-30 DIAGNOSIS — E7800 Pure hypercholesterolemia, unspecified: Secondary | ICD-10-CM | POA: Diagnosis not present

## 2018-06-30 DIAGNOSIS — R7302 Impaired glucose tolerance (oral): Secondary | ICD-10-CM | POA: Diagnosis not present

## 2018-06-30 DIAGNOSIS — Z79899 Other long term (current) drug therapy: Secondary | ICD-10-CM

## 2018-06-30 DIAGNOSIS — D649 Anemia, unspecified: Secondary | ICD-10-CM

## 2018-06-30 DIAGNOSIS — Z5181 Encounter for therapeutic drug level monitoring: Secondary | ICD-10-CM

## 2018-06-30 DIAGNOSIS — E559 Vitamin D deficiency, unspecified: Secondary | ICD-10-CM | POA: Diagnosis not present

## 2018-06-30 NOTE — Progress Notes (Addendum)
CARDIAC Individual Treatment Plan  Patient Details  Name: Zachary [name] MRN: [id number] Date of Birth: April 03, 1961 Referring Provider:   Flowsheet Row CARDIAC REHAB PHASE II ORIENTATION from 06/23/2018 in [facility] CARDIAC [facility]  Referring Provider  Lewayne Bunting MD       Initial Encounter Date:  Flowsheet Row CARDIAC REHAB PHASE II ORIENTATION from 06/23/2018 in [facility] CARDIAC REHAB  Date  06/23/18      Visit Diagnosis: S/P CABG x 3  Patient's Home Medications on Admission:   Current Outpatient Medications:  .  acetaminophen (TYLENOL) 325 MG tablet, Take 325 mg by mouth every 6 (six) hours as needed., Disp: , Rfl:  .  albuterol (PROVENTIL HFA;VENTOLIN HFA) 108 (90 Base) MCG/ACT inhaler, Inhale 2 puffs into the lungs every 6 (six) hours as needed for wheezing or shortness of breath., Disp: 1 Inhaler, Rfl: 2 .  amiodarone (PACERONE) 200 MG tablet, Take 1 tablet (200 mg total) by mouth daily. (Patient not taking: Reported on 06/21/2018), Disp: 20 tablet, Rfl: 0 .  aspirin EC 325 MG EC tablet, Take 1 tablet (325 mg total) by mouth daily., Disp: , Rfl:  .  aspirin EC 81 MG tablet, Take 81 mg by mouth daily., Disp: , Rfl:  .  atorvastatin (LIPITOR) 80 MG tablet, Take 1 tablet (80 mg total) by mouth daily at 6 PM., Disp: 30 tablet, Rfl: 1 .  benzonatate (TESSALON) 100 MG capsule, Take 1 capsule (100 mg total) by mouth 3 (three) times daily as needed for cough. (Patient not taking: Reported on 06/21/2018), Disp: 30 capsule, Rfl: 0 .  lisinopril (PRINIVIL,ZESTRIL) 2.5 MG tablet, Take 1 tablet (2.5 mg total) by mouth daily., Disp: 30 tablet, Rfl: 1 .  metoprolol tartrate (LOPRESSOR) 25 MG tablet, TAKE 0.5 TABLETS (12.5 MG TOTAL) BY MOUTH 2 (TWO) TIMES DAILY., Disp: 90 tablet, Rfl: 1 .  oxyCODONE (OXY IR/ROXICODONE) 5 MG immediate release tablet, Take 1 tablet (5 mg total) by mouth every 6 (six) hours as needed for severe pain. (Patient not taking: Reported  on 06/21/2018), Disp: 30 tablet, Rfl: 0  Past Medical History: Past Medical History:  Diagnosis Date  . Hyperlipemia     Tobacco Use: Social History   Tobacco Use  Smoking Status Former Smoker  . Types: Cigarettes  . Start date: 10/26/1982  . Last attempt to quit: 04/20/2018  . Years since quitting: 0.1  Smokeless Tobacco Never Used  Tobacco Comment   smoked about 20 cigarrets every 2 days.     Labs: Recent Review Flowsheet Data    Labs for ITP Cardiac and Pulmonary Rehab Latest Ref Rng & Units 04/23/2018 04/23/2018 04/23/2018 04/23/2018 04/24/2018   Cholestrol 0 - 200 mg/dL - - - - -   LDLCALC 0 - 99 mg/dL - - - - -   HDL >40 mg/dL - - - - -   Trlycerides <150 mg/dL - - - - -   Hemoglobin A1c 4.8 - 5.6 % - - - - -   PHART 7.350 - 7.450 7.367 7.343(L) 7.357 - -   PCO2ART 32.0 - 48.0 mmHg 40.8 43.2 43.0 - -   HCO3 20.0 - 28.0 mmol/L 23.6 23.6 24.2 - -   TCO2 22 - 32 mmol/L 25 25 26 22 23    ACIDBASEDEF 0.0 - 2.0 mmol/L 2.0 2.0 1.0 - -   O2SAT % 94.0 97.0 98.0 - -      Capillary Blood Glucose: Lab Results  Component Value Date  GLUCAP 122 (H) 04/27/2018   GLUCAP 159 (H) 04/26/2018   GLUCAP 125 (H) 04/26/2018   GLUCAP 67 (L) 04/26/2018   GLUCAP 136 (H) 04/26/2018     Pulmonary Assessment Scores:   Pulmonary Function Assessment:   Exercise Target Goals: Exercise Program Goal: Individual exercise prescription set using results from initial 6 min walk test and THRR while considering  patient's activity barriers and safety.   Exercise Prescription Goal: Initial exercise prescription builds to 30-45 minutes a day of aerobic activity, 2-3 days per week.  Home exercise guidelines will be given to patient during program as part of exercise prescription that the participant will acknowledge.  Activity Barriers & Risk Stratification: Activity Barriers & Cardiac Risk Stratification - 06/23/18 1209    Activity Barriers & Cardiac Risk Stratification          Activity  Barriers  None    Cardiac Risk Stratification  High           6 Minute Walk: 6 Minute Walk    6 Minute Walk    Row Name 06/23/18 1208   Phase  Initial   Distance  1652 feet   Walk Time  6 minutes   # of Rest Breaks  0   MPH  3.13   METS  3.85   RPE  10   Perceived Dyspnea   0   VO2 Peak  13.5   Symptoms  No   Resting HR  66 bpm   Resting BP  110/64   Resting Oxygen Saturation   98 %   Exercise Oxygen Saturation  during 6 min walk  99 %   Max Ex. HR  95 bpm   Max Ex. BP  120/70   2 Minute Post BP  128/74          Oxygen Initial Assessment:   Oxygen Re-Evaluation:   Oxygen Discharge (Final Oxygen Re-Evaluation):   Initial Exercise Prescription: Initial Exercise Prescription - 06/23/18 1200    Date of Initial Exercise RX and Referring Provider          Date  06/23/18    Referring Provider  Lewayne Bunting MD     Expected Discharge Date  09/16/18        Treadmill          MPH  3    Grade  0    Minutes  10    METs  3.3        Bike          Level  1    Minutes  10    METs  3.1        NuStep          Level  2    SPM  80    Minutes  10    METs  2.8        Prescription Details          Frequency (times per week)  3x    Duration  Progress to 30 minutes of continuous aerobic without signs/symptoms of physical distress        Intensity          THRR 40-80% of Max Heartrate  64-129    Ratings of Perceived Exertion  11-13    Perceived Dyspnea  0-4        Progression          Progression  Continue progressive overload as per policy without signs/symptoms or physical distress.  Resistance Training          Training Prescription  Yes    Weight  3lbs    Reps  10-15           Perform Capillary Blood Glucose checks as needed.  Exercise Prescription Changes: Exercise Prescription Changes    Response to Exercise    Row Name 06/29/18 1000   Blood Pressure (Admit)  114/64   Blood Pressure (Exercise)  138/78   Blood Pressure  (Exit)  114/70   Heart Rate (Admit)  70 bpm   Heart Rate (Exercise)  97 bpm   Heart Rate (Exit)  70 bpm   Rating of Perceived Exertion (Exercise)  11   Perceived Dyspnea (Exercise)  0   Symptoms  None    Comments  Pt oriented to exercise equipment    Duration  Continue with 45 min of aerobic exercise without signs/symptoms of physical distress.   Intensity  THRR unchanged       Progression    Row Name 06/29/18 1000   Progression  Continue to progress workloads to maintain intensity without signs/symptoms of physical distress.   Average METs  2.95       Resistance Training    Row Name 06/29/18 1000   Training Prescription  No       Interval Training    Row Name 06/29/18 1000   Interval Training  No       Treadmill    Row Name 06/29/18 1000   MPH  3   Grade  0   Minutes  10   METs  3.3       Bike    Row Name 06/29/18 1000   Level  1   Minutes  10   METs  3.07       NuStep    Row Name 06/29/18 1000   Level  2   SPM  85   Minutes  10   METs  2.5          Exercise Comments: Exercise Comments    Row Name 06/29/18 1003   Exercise Comments  Pt oriented to exericse equipment. Pt's first day of exericse. Pt responded well to exercise and is off to good start. Will continue to monitor.       Exercise Goals and Review: Exercise Goals    Exercise Goals    Row Name 06/23/18 1209   Increase Physical Activity  Yes   Intervention  Provide advice, education, support and counseling about physical activity/exercise needs.;Develop an individualized exercise prescription for aerobic and resistive training based on initial evaluation findings, risk stratification, comorbidities and participant's personal goals.   Expected Outcomes  Short Term: Attend rehab on a regular basis to increase amount of physical activity.;Long Term: Add in home exercise to make exercise part of routine and to increase amount of physical activity.;Long Term: Exercising regularly at least 3-5 days  a week.   Increase Strength and Stamina  Yes   Intervention  Provide advice, education, support and counseling about physical activity/exercise needs.;Develop an individualized exercise prescription for aerobic and resistive training based on initial evaluation findings, risk stratification, comorbidities and participant's personal goals.   Expected Outcomes  Short Term: Increase workloads from initial exercise prescription for resistance, speed, and METs.;Long Term: Improve cardiorespiratory fitness, muscular endurance and strength as measured by increased METs and functional capacity ( );Short Term: Perform resistance training exercises routinely during rehab and add in resistance training at home   Able to understand  and use rate of perceived exertion (RPE) scale  Yes   Intervention  Provide education and explanation on how to use RPE scale   Expected Outcomes  Short Term: Able to use RPE daily in rehab to express subjective intensity level;Long Term:  Able to use RPE to guide intensity level when exercising independently   Knowledge and understanding of Target Heart Rate Range (THRR)  Yes   Intervention  Provide education and explanation of THRR including how the numbers were predicted and where they are located for reference   Expected Outcomes  Short Term: Able to state/look up THRR;Long Term: Able to use THRR to govern intensity when exercising independently;Short Term: Able to use daily as guideline for intensity in rehab   Able to check pulse independently  Yes   Intervention  Provide education and demonstration on how to check pulse in carotid and radial arteries.;Review the importance of being able to check your own pulse for safety during independent exercise   Expected Outcomes  Short Term: Able to explain why pulse checking is important during independent exercise;Long Term: Able to check pulse independently and accurately   Intervention  Provide education, explanation, and written  materials on patient's individual exercise prescription   Expected Outcomes  Short Term: Able to explain program exercise prescription;Long Term: Able to explain home exercise prescription to exercise independently          Exercise Goals Re-Evaluation :   Discharge Exercise Prescription (Final Exercise Prescription Changes): Exercise Prescription Changes - 06/29/18 1000    Response to Exercise          Blood Pressure (Admit)  114/64    Blood Pressure (Exercise)  138/78    Blood Pressure (Exit)  114/70    Heart Rate (Admit)  70 bpm    Heart Rate (Exercise)  97 bpm    Heart Rate (Exit)  70 bpm    Rating of Perceived Exertion (Exercise)  11    Perceived Dyspnea (Exercise)  0    Symptoms  None     Comments  Pt oriented to exercise equipment     Duration  Continue with 45 min of aerobic exercise without signs/symptoms of physical distress.    Intensity  THRR unchanged        Progression          Progression  Continue to progress workloads to maintain intensity without signs/symptoms of physical distress.    Average METs  2.95        Resistance Training          Training Prescription  No        Interval Training          Interval Training  No        Treadmill          MPH  3    Grade  0    Minutes  10    METs  3.3        Bike          Level  1    Minutes  10    METs  3.07        NuStep          Level  2    SPM  85    Minutes  10    METs  2.5           Nutrition:  Target Goals: Understanding of nutrition guidelines, daily intake of sodium 1500mg , cholesterol 200mg , calories  30% from fat and 7% or less from saturated fats, daily to have 5 or more servings of fruits and vegetables.  Biometrics: Pre Biometrics - 06/23/18 1210    Pre Biometrics          Height  5' 8.5" (1.74 m)    Weight  91.3 kg    Waist Circumference  39 inches    Hip Circumference  41 inches    Waist to Hip Ratio  0.95 %    BMI (Calculated)  30.16    Triceps Skinfold  12  mm    % Body Fat  26.7 %    Grip Strength  40 kg    Flexibility  15 in    Single Leg Stand  30 seconds            Nutrition Therapy Plan and Nutrition Goals: Nutrition Therapy & Goals - 06/23/18 1025    Nutrition Therapy          Diet  heart healthy carb modified  (Pended)            Nutrition Assessments:   Nutrition Goals Re-Evaluation:   Nutrition Goals Discharge (Final Nutrition Goals Re-Evaluation):   Psychosocial: Target Goals: Acknowledge presence or absence of significant depression and/or stress, maximize coping skills, provide positive support system. Participant is able to verbalize types and ability to use techniques and skills needed for reducing stress and depression.  Initial Review & Psychosocial Screening: Initial Psych Review & Screening - 06/23/18 1053    Initial Review          Current issues with  None Identified        Family Dynamics          Good Support System?  Yes   Pt lists his wife, children, grandparents, and golf buddies as sources of support.        Barriers          Psychosocial barriers to participate in program  There are no identifiable barriers or psychosocial needs.        Screening Interventions          Interventions  Encouraged to exercise           Quality of Life Scores: Quality of Life - 06/23/18 1120    Quality of Life          Select  Quality of Life        Quality of Life Scores          Health/Function Pre  22.2 %    Socioeconomic Pre  22.5 %    Psych/Spiritual Pre  22.71 %    Family Pre  24.5 %    GLOBAL Pre  22.7 %          Scores of 19 and below usually indicate a poorer quality of life in these areas.  A difference of  2-3 points is a clinically meaningful difference.  A difference of 2-3 points in the total score of the Quality of Life Index has been associated with significant improvement in overall quality of life, self-image, physical symptoms, and general health in studies assessing  change in quality of life.  PHQ-9: Recent Review Flowsheet Data    Depression screen [facility] 2/9 05/10/2018   Decreased Interest 0   Down, Depressed, Hopeless 0   PHQ - 2 Score 0     Interpretation of Total Score  Total Score Depression Severity:  1-4 = Minimal depression, 5-9 = Mild depression, 10-14 =  Moderate depression, 15-19 = Moderately severe depression, 20-27 = Severe depression   Psychosocial Evaluation and Intervention: Psychosocial Evaluation - 06/29/18 1128    Psychosocial Evaluation & Interventions          Interventions  Encouraged to exercise with the program and follow exercise prescription    Comments  no psychosocial needs identified, no interventions necessary. pt enjoys playing golf, has been putting, chipping and 3/4 swings following Dr. Dennie Maizes recommendation.     Expected Outcomes  pt will exhibit positive outlook with good coping skills.     Continue Psychosocial Services   No Follow up required           Psychosocial Re-Evaluation:   Psychosocial Discharge (Final Psychosocial Re-Evaluation):   Education: Education Goals: Education classes will be provided on a weekly basis, covering required topics. Participant will state understanding/return demonstration of topics presented.  Learning Barriers/Preferences: Learning Barriers/Preferences - 06/23/18 1245    Learning Barriers/Preferences          Learning Barriers  Sight    Learning Preferences  Skilled Demonstration           Education Topics: Risk Factor Reduction:  -Group instruction that is supported by a PowerPoint presentation. Instructor discusses the definition of a risk factor, different risk factors for pulmonary disease, and how the heart and lungs work together.     Nutrition for Pulmonary Patient:  -Group instruction provided by PowerPoint slides, verbal discussion, and written materials to support subject matter. The instructor gives an explanation and review of healthy diet  recommendations, which includes a discussion on weight management, recommendations for fruit and vegetable consumption, as well as protein, fluid, caffeine, fiber, sodium, sugar, and alcohol. Tips for eating when patients are short of breath are discussed.   Pursed Lip Breathing:  -Group instruction that is supported by demonstration and informational handouts. Instructor discusses the benefits of pursed lip and diaphragmatic breathing and detailed demonstration on how to preform both.     Oxygen Safety:  -Group instruction provided by PowerPoint, verbal discussion, and written material to support subject matter. There is an overview of "What is Oxygen" and "Why do we need it".  Instructor also reviews how to create a safe environment for oxygen use, the importance of using oxygen as prescribed, and the risks of noncompliance. There is a brief discussion on traveling with oxygen and resources the patient may utilize.   Oxygen Equipment:  -Group instruction provided by [facility] Staff utilizing handouts, written materials, and equipment demonstrations.   Signs and Symptoms:  -Group instruction provided by written material and verbal discussion to support subject matter. Warning signs and symptoms of infection, stroke, and heart attack are reviewed and when to call the physician/911 reinforced. Tips for preventing the spread of infection discussed.   Advanced Directives:  -Group instruction provided by verbal instruction and written material to support subject matter. Instructor reviews Advanced Directive laws and proper instruction for filling out document.   Pulmonary Video:  -Group video education that reviews the importance of medication and oxygen compliance, exercise, good nutrition, pulmonary hygiene, and pursed lip and diaphragmatic breathing for the pulmonary patient.   Exercise for the Pulmonary Patient:  -Group instruction that is supported by a PowerPoint presentation.  Instructor discusses benefits of exercise, core components of exercise, frequency, duration, and intensity of an exercise routine, importance of utilizing pulse oximetry during exercise, safety while exercising, and options of places to exercise outside of rehab.     Pulmonary Medications:  -Verbally  interactive group education provided by instructor with focus on inhaled medications and proper administration.   Anatomy and Physiology of the Respiratory System and Intimacy:  -Group instruction provided by PowerPoint, verbal discussion, and written material to support subject matter. Instructor reviews respiratory cycle and anatomical components of the respiratory system and their functions. Instructor also reviews differences in obstructive and restrictive respiratory diseases with examples of each. Intimacy, Sex, and Sexuality differences are reviewed with a discussion on how relationships can change when diagnosed with pulmonary disease. Common sexual concerns are reviewed.   MD DAY -A group question and answer session with a medical doctor that allows participants to ask questions that relate to their pulmonary disease state.   OTHER EDUCATION -Group or individual verbal, written, or video instructions that support the educational goals of the pulmonary rehab program.   Holiday Eating Survival Tips:  -Group instruction provided by PowerPoint slides, verbal discussion, and written materials to support subject matter. The instructor gives patients tips, tricks, and techniques to help them not only survive but enjoy the holidays despite the onslaught of food that accompanies the holidays.   Knowledge Questionnaire Score: Knowledge Questionnaire Score - 06/23/18 1120    Knowledge Questionnaire Score          Pre Score  22/24           Core Components/Risk Factors/Patient Goals at Admission: Personal Goals and Risk Factors at Admission - 06/23/18 1332    Core Components/Risk  Factors/Patient Goals on Admission          Intervention  Weight Management: Develop a combined nutrition and exercise program designed to reach desired caloric intake, while maintaining appropriate intake of nutrient and fiber, sodium and fats, and appropriate energy expenditure required for the weight goal.;Weight Management/Obesity: Establish reasonable short term and long term weight goals.;Weight Management: Provide education and appropriate resources to help participant work on and attain dietary goals.;Obesity: Provide education and appropriate resources to help participant work on and attain dietary goals.    Admit Weight  201 lb 4.5 oz (91.3 kg)    Goal Weight: Short Term  190 lb (86.2 kg)    Goal Weight: Long Term  185 lb (83.9 kg)    Expected Outcomes  Long Term: Adherence to nutrition and physical activity/exercise program aimed toward attainment of established weight goal;Short Term: Continue to assess and modify interventions until short term weight is achieved;Weight Loss: Understanding of general recommendations for a balanced deficit meal plan, which promotes 1-2 lb weight loss per week and includes a negative energy balance of [phone] kcal/d;Understanding recommendations for meals to include 15-35% energy as protein, 25-35% energy from fat, 35-60% energy from carbohydrates, less than 200mg  of dietary cholesterol, 20-35 gm of total fiber daily;Understanding of distribution of calorie intake throughout the day with the consumption of 4-5 meals/snacks;Weight Gain: Understanding of general recommendations for a high calorie, high protein meal plan that promotes weight gain by distributing calorie intake throughout the day with the consumption for 4-5 meals, snacks, and/or supplements    Hypertension  Yes    Intervention  Provide education on lifestyle modifcations including regular physical activity/exercise, weight management, moderate sodium restriction and increased consumption of fresh  fruit, vegetables, and low fat dairy, alcohol moderation, and smoking cessation.;Monitor prescription use compliance.    Expected Outcomes  Short Term: Continued assessment and intervention until BP is < 140/23mm HG in hypertensive participants. < 130/77mm HG in hypertensive participants with diabetes, heart failure or chronic kidney disease.;Long Term: Maintenance of  blood pressure at goal levels.    Lipids  Yes    Intervention  Provide education and support for participant on nutrition & aerobic/resistive exercise along with prescribed medications to achieve LDL 70mg , HDL >40mg .    Expected Outcomes  Short Term: Participant states understanding of desired cholesterol values and is compliant with medications prescribed. Participant is following exercise prescription and nutrition guidelines.;Long Term: Cholesterol controlled with medications as prescribed, with individualized exercise RX and with personalized nutrition plan. Value goals: LDL < 70mg , HDL > 40 mg.           Core Components/Risk Factors/Patient Goals Review:  Goals and Risk Factor Review    Core Components/Risk Factors/Patient Goals Review    Row Name 06/29/18 1129   Personal Goals Review  Weight Management/Obesity;Hypertension;Lipids   Review  pt with significant CAD RF demonstrates eagerness to participate in CR program. pt personal goal is to lose weight and complete CR program to gain insight and guidance to make healthy changes.     Expected Outcomes  pt will participate in CR exercise, nutrition and lifestyle modification to decrease overall CAD RF.            Core Components/Risk Factors/Patient Goals at Discharge (Final Review):  Goals and Risk Factor Review - 06/29/18 1129    Core Components/Risk Factors/Patient Goals Review          Personal Goals Review  Weight Management/Obesity;Hypertension;Lipids    Review  pt with significant CAD RF demonstrates eagerness to participate in CR program. pt personal goal is to  lose weight and complete CR program to gain insight and guidance to make healthy changes.      Expected Outcomes  pt will participate in CR exercise, nutrition and lifestyle modification to decrease overall CAD RF.             ITP Comments: ITP Comments    Row Name 06/23/18 1052 06/29/18 1127   ITP Comments  Dr. Armanda Magic, Medical Director  pt started group exercise session. pt tolerated light activity without difficulty. pt oriented to exercise equipment and safety routine.       Comments:

## 2018-07-01 ENCOUNTER — Encounter (HOSPITAL_COMMUNITY): Payer: 59

## 2018-07-01 ENCOUNTER — Encounter: Payer: Self-pay | Admitting: Internal Medicine

## 2018-07-01 ENCOUNTER — Telehealth: Payer: Self-pay | Admitting: Internal Medicine

## 2018-07-01 ENCOUNTER — Encounter (HOSPITAL_COMMUNITY)
Admission: RE | Admit: 2018-07-01 | Discharge: 2018-07-01 | Disposition: A | Discharge status: Home / Self Care | Payer: 59 | Source: Ambulatory Visit | Attending: Cardiology | Admitting: Cardiology

## 2018-07-01 ENCOUNTER — Other Ambulatory Visit: Payer: Self-pay | Admitting: Internal Medicine

## 2018-07-01 DIAGNOSIS — E559 Vitamin D deficiency, unspecified: Secondary | ICD-10-CM

## 2018-07-01 DIAGNOSIS — R911 Solitary pulmonary nodule: Secondary | ICD-10-CM

## 2018-07-01 DIAGNOSIS — Z951 Presence of aortocoronary bypass graft: Secondary | ICD-10-CM

## 2018-07-01 DIAGNOSIS — I252 Old myocardial infarction: Secondary | ICD-10-CM

## 2018-07-01 DIAGNOSIS — D649 Anemia, unspecified: Secondary | ICD-10-CM

## 2018-07-01 DIAGNOSIS — Z8249 Family history of ischemic heart disease and other diseases of the circulatory system: Secondary | ICD-10-CM

## 2018-07-01 LAB — LIPID PANEL
CHOL/HDL RATIO: 2.8 (calc) (ref <5.0)
CHOLESTEROL: 123 mg/dL (ref <200)
HDL: 44 mg/dL (ref >40)
LDL CHOLESTEROL (CALC): 65 mg/dL
Non-HDL Cholesterol (Calc): 79 mg/dL (calc) (ref <130)
Triglycerides: 67 mg/dL (ref <150)

## 2018-07-01 LAB — CBC WITH DIFFERENTIAL/PLATELET
BASOS ABS: 27 {cells}/uL (ref 0–200)
Basophils Relative: 0.4 %
EOS ABS: 231 {cells}/uL (ref 15–500)
EOS PCT: 3.4 %
HEMATOCRIT: 38.3 % — AB (ref 38.5–50.0)
HEMOGLOBIN: 12.8 g/dL — AB (ref 13.2–17.1)
LYMPHS ABS: 2006 {cells}/uL (ref 850–3900)
MCH: 28.8 pg (ref 27.0–33.0)
MCHC: 33.4 g/dL (ref 32.0–36.0)
MCV: 86.1 fL (ref 80.0–100.0)
MPV: 9.7 fL (ref 7.5–12.5)
Monocytes Relative: 12.6 %
NEUTROS PCT: 54.1 %
Neutro Abs: 3679 cells/uL (ref 1500–7800)
Platelets: 299 10*3/uL (ref 140–400)
RBC: 4.45 10*6/uL (ref 4.20–5.80)
RDW: 13.5 % (ref 11.0–15.0)
Total Lymphocyte: 29.5 %
WBC mixed population: 857 cells/uL (ref 200–950)
WBC: 6.8 10*3/uL (ref 3.8–10.8)

## 2018-07-01 LAB — HEPATIC FUNCTION PANEL
AG RATIO: 1.8 (calc) (ref 1.0–2.5)
ALBUMIN MSPROF: 4 g/dL (ref 3.6–5.1)
ALT: 15 U/L (ref 9–46)
AST: 15 U/L (ref 10–35)
Alkaline phosphatase (APISO): 56 U/L (ref 40–115)
BILIRUBIN DIRECT: 0.2 mg/dL (ref 0.0–0.2)
Globulin: 2.2 g/dL (calc) (ref 1.9–3.7)
Indirect Bilirubin: 0.6 mg/dL (calc) (ref 0.2–1.2)
Total Bilirubin: 0.8 mg/dL (ref 0.2–1.2)
Total Protein: 6.2 g/dL (ref 6.1–8.1)

## 2018-07-01 LAB — HEMOGLOBIN A1C
Hgb A1c MFr Bld: 6 % of total Hgb — ABNORMAL HIGH (ref <5.7)
MEAN PLASMA GLUCOSE: 126 (calc)
eAG (mmol/L): 7 (calc)

## 2018-07-01 LAB — VITAMIN D 25 HYDROXY (VIT D DEFICIENCY, FRACTURES): Vit D, 25-Hydroxy: 28 ng/mL — ABNORMAL LOW (ref 30–100)

## 2018-07-01 NOTE — Telephone Encounter (Signed)
Initially seen in July after CABG and had postop anemia. Hgb slowly improving. Vitamin D slightly low. Needs 2000 units D3 daily. Hgb AIC stable at 6%.  Order anemia studies Fe TIBC B12 and folate with retic count. Would like to see him and discuss next week.

## 2018-07-04 ENCOUNTER — Encounter: Payer: Self-pay | Admitting: Internal Medicine

## 2018-07-04 ENCOUNTER — Ambulatory Visit (INDEPENDENT_AMBULATORY_CARE_PROVIDER_SITE_OTHER): Payer: 59 | Admitting: Internal Medicine

## 2018-07-04 ENCOUNTER — Encounter (HOSPITAL_COMMUNITY): Payer: 59

## 2018-07-04 ENCOUNTER — Encounter (HOSPITAL_COMMUNITY)
Admission: RE | Admit: 2018-07-04 | Discharge: 2018-07-04 | Disposition: A | Discharge status: Home / Self Care | Payer: 59 | Source: Ambulatory Visit | Attending: Cardiology | Admitting: Cardiology

## 2018-07-04 VITALS — BP 122/80 | HR 80 | Ht 69.0 in | Wt 204.0 lb

## 2018-07-04 DIAGNOSIS — I252 Old myocardial infarction: Secondary | ICD-10-CM

## 2018-07-04 DIAGNOSIS — E559 Vitamin D deficiency, unspecified: Secondary | ICD-10-CM

## 2018-07-04 DIAGNOSIS — Z23 Encounter for immunization: Secondary | ICD-10-CM | POA: Diagnosis not present

## 2018-07-04 DIAGNOSIS — D649 Anemia, unspecified: Secondary | ICD-10-CM

## 2018-07-04 DIAGNOSIS — Z951 Presence of aortocoronary bypass graft: Secondary | ICD-10-CM

## 2018-07-04 DIAGNOSIS — R911 Solitary pulmonary nodule: Secondary | ICD-10-CM

## 2018-07-04 DIAGNOSIS — Z8249 Family history of ischemic heart disease and other diseases of the circulatory system: Secondary | ICD-10-CM | POA: Diagnosis not present

## 2018-07-04 NOTE — Progress Notes (Signed)
   Subjective:    Patient ID: Zachary [name], male    DOB: [birth year], 59 y.o.   MRN: [id number]  HPI 57 year old  Male for follow up of anemia  s/p CABG. His iron level is normal at 125 and his ferritin is also normal.  B12 and folate levels are within normal limits.  Hemoglobin has increased from 12.1 g to 12.8 g.  White blood cell count is 6800 and platelet count is 299,000.  His vitamin D level is low at 28 he will take 2000 units vitamin D3 daily.  Lipid panel and liver functions are normal.  He is beginning to get more strength and feeling better.    Review of Systems no new complaints     Objective:   Physical Exam Neck supple.  No JVD thyromegaly or carotid bruits.  Chest clear to auscultation.  Cardiac exam regular rate and rhythm normal S1 and S2.  Extremities without edema.         Assessment & Plan:  Postoperative anemia continues to improve  Status post CABG-continues to regain strength and feel better with no complaint of chest pain.  Vitamin D deficiency-take 2000 units vitamin D3 daily  6 mm subpleural nodule along the right major fissure that will need follow-up in 6 to 12 months.  Recommend  Centrum daily and 2000 units vitamin D3 daily.  Follow-up with anemia in 3 months.  Flu vaccine given.

## 2018-07-05 ENCOUNTER — Telehealth: Payer: Self-pay | Admitting: Internal Medicine

## 2018-07-05 LAB — RETICULOCYTES
ABS Retic: 71200 cells/uL (ref 25000–9000)
Retic Ct Pct: 1.6 %

## 2018-07-05 LAB — VITAMIN B12: Vitamin B-12: 390 pg/mL (ref 200–1100)

## 2018-07-05 LAB — IRON,TIBC AND FERRITIN PANEL
%SAT: 32 % (ref 20–48)
Ferritin: 74 ng/mL (ref 38–380)
Iron: 125 ug/dL (ref 50–180)
TIBC: 392 ug/dL (ref 250–425)

## 2018-07-05 LAB — FOLATE: Folate: 9.6 ng/mL

## 2018-07-05 NOTE — Telephone Encounter (Signed)
Anemia results are WNL. Suggest MVI with iron and follow up in 8 weeks.

## 2018-07-06 ENCOUNTER — Encounter (HOSPITAL_COMMUNITY): Payer: 59

## 2018-07-06 ENCOUNTER — Encounter (HOSPITAL_COMMUNITY)
Admission: RE | Admit: 2018-07-06 | Discharge: 2018-07-06 | Disposition: A | Discharge status: Home / Self Care | Payer: 59 | Source: Ambulatory Visit | Attending: Cardiology | Admitting: Cardiology

## 2018-07-06 DIAGNOSIS — Z951 Presence of aortocoronary bypass graft: Secondary | ICD-10-CM | POA: Diagnosis not present

## 2018-07-08 ENCOUNTER — Encounter (HOSPITAL_COMMUNITY): Payer: 59

## 2018-07-11 ENCOUNTER — Encounter (HOSPITAL_COMMUNITY)
Admission: RE | Admit: 2018-07-11 | Discharge: 2018-07-11 | Disposition: A | Discharge status: Home / Self Care | Payer: 59 | Source: Ambulatory Visit | Attending: Cardiology | Admitting: Cardiology

## 2018-07-11 ENCOUNTER — Encounter (HOSPITAL_COMMUNITY): Payer: 59

## 2018-07-11 DIAGNOSIS — Z951 Presence of aortocoronary bypass graft: Secondary | ICD-10-CM

## 2018-07-11 NOTE — Progress Notes (Signed)
I have reviewed a Home Exercise Prescription with Zachary [name] . Zachary [name] is not currently exercising at home. The patient was advised to walk 2-3 days a week for 30-45 minutes. Pt also plans to start going to local gym for exercise.  [name] and I discussed how to progress their exercise prescription. The patient stated that their goal is to add the elliptical to exercise prescription. The patient stated that they understand the exercise prescription.  We reviewed exercise guidelines, target heart rate during exercise, RPE Scale, weather conditions, NTG use, endpoints for exercise, warmup and cool down. Patient is encouraged to come to me with any questions. I will continue to follow up with the patient to assist them with progression and safety.    York Cerise MS, ACSM CEP 3:20 PM 07/11/2018

## 2018-07-13 ENCOUNTER — Encounter (HOSPITAL_COMMUNITY): Payer: 59

## 2018-07-13 ENCOUNTER — Telehealth (HOSPITAL_COMMUNITY): Payer: Self-pay | Admitting: Internal Medicine

## 2018-07-14 ENCOUNTER — Ambulatory Visit (INDEPENDENT_AMBULATORY_CARE_PROVIDER_SITE_OTHER): Payer: Self-pay | Admitting: Cardiothoracic Surgery

## 2018-07-14 VITALS — BP 118/76 | HR 56 | Resp 20 | Ht 69.0 in | Wt 204.0 lb

## 2018-07-14 DIAGNOSIS — I2510 Atherosclerotic heart disease of native coronary artery without angina pectoris: Secondary | ICD-10-CM

## 2018-07-14 DIAGNOSIS — Z951 Presence of aortocoronary bypass graft: Secondary | ICD-10-CM

## 2018-07-14 DIAGNOSIS — I214 Non-ST elevation (NSTEMI) myocardial infarction: Secondary | ICD-10-CM

## 2018-07-14 NOTE — Progress Notes (Signed)
[address]       [place] [address]             [id number]      Zachary [name] [facility] Medical Record #[id number] Date of Birth: May 27, 1961  Referring: Lewayne Bunting, MD Primary Care: Margaree Mackintosh, MD Primary Cardiologist: No primary care provider on file.   Chief Complaint:   POST OP FOLLOW UP 04/23/2018 PREOPERATIVE DIAGNOSIS:  Emergency coronary artery bypass grafting with recent subendocardial myocardial infarction. 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 SURGICAL PROCEDURE:  Emergency coronary artery bypass grafting x3 with left internal mammary to the left anterior descending coronary artery, reverse saphenous vein graft to the posterior descending coronary artery, reverse saphenous vein graft to the  distal circumflex coronary artery with right thigh greater saphenous vein harvesting.  History of Present Illness:    Patient continues to do well following emergency coronary artery bypass grafting June 29.  Most importantly he has remained cigarettes since the time of his admission with acute MI.  He is active in cardiac rehab.  He has had no recurrent anginal symptoms or evidence of congestive heart failure.    Past Medical History:  Diagnosis Date  . Hyperlipemia      Social History   Tobacco Use  Smoking Status Former Smoker  . Types: Cigarettes  . Start date: 10/26/1982  . Last attempt to quit: 04/20/2018  . Years since quitting: 0.2  Smokeless Tobacco Never Used  Tobacco Comment   smoked about 20 cigarrets every 2 days.     Social History   Substance and Sexual Activity  Alcohol Use Yes   Comment: socially      No Known Allergies  Current Outpatient Medications  Medication Sig Dispense Refill  . acetaminophen (TYLENOL) 325 MG tablet Take 325 mg by mouth every 6 (six) hours as needed.    Marland Kitchen aspirin EC 81 MG tablet Take 81 mg by mouth daily.      Marland Kitchen atorvastatin (LIPITOR) 80 MG tablet Take 1 tablet (80 mg total) by mouth daily at 6 PM. 30 tablet 1  . lisinopril (PRINIVIL,ZESTRIL) 2.5 MG tablet Take 1 tablet (2.5 mg total) by mouth daily. 30 tablet 1  . metoprolol tartrate (LOPRESSOR) 25 MG tablet TAKE 0.5 TABLETS (12.5 MG TOTAL) BY MOUTH 2 (TWO) TIMES DAILY. 90 tablet 1   No current facility-administered medications for this visit.        Physical Exam: BP 118/76   Pulse (!) 56   Resp 20   Ht 5\' 9"  (1.753 m)   Wt 204 lb (92.5 kg)   SpO2 99% Comment: RA  BMI 30.13 kg/m  General appearance: alert, cooperative and no distress Head: Normocephalic, without obvious abnormality, atraumatic Neck: no adenopathy, no carotid bruit, no JVD, supple, symmetrical, trachea midline and thyroid not enlarged, symmetric, no tenderness/mass/nodules Lymph nodes: Cervical, supraclavicular, and axillary nodes normal. Resp: clear to auscultation bilaterally Cardio: regular rate and rhythm, S1, S2 normal, no murmur, click, rub or gallop GI: soft, non-tender; bowel sounds normal; no masses,  no organomegaly Extremities: extremities normal, atraumatic, no cyanosis or edema Neurologic: Grossly normal  Diagnostic Studies & Laboratory data:    Recent Lab Findings: Lab Results  Component Value Date   WBC 6.8 06/30/2018   HGB 12.8 (L) 06/30/2018   HCT 38.3 (L) 06/30/2018   PLT 299 06/30/2018   GLUCOSE 119 05/10/2018   CHOL 123 06/30/2018  TRIG 67 06/30/2018   HDL 44 06/30/2018   LDLCALC 65 06/30/2018   ALT 15 06/30/2018   AST 15 06/30/2018   NA 136 05/10/2018   K 5.3 05/10/2018   CL 98 05/10/2018   CREATININE 1.16 05/10/2018   BUN 13 05/10/2018   CO2 29 05/10/2018   TSH 3.66 05/10/2018   INR 1.30 04/23/2018   HGBA1C 6.0 (H) 06/30/2018      Assessment / Plan:   Patient doing well following coronary artery bypass grafting done his emergency June 26.  Since surgery he has remained off cigarettes He is increasing his physical  activity appropriately, cautioned about any heavy lifting until 3 months postop He was congratulated on not smoking since the time of his admission, he notes he continues to stay tobacco free Plan to see him back as necessary, has follow-up appointment cardiology next month     Delight Ovens MD      [address] [place] [address] Office [phone]   Beeper [phone]  07/14/2018 11:47 AM

## 2018-07-15 ENCOUNTER — Encounter (HOSPITAL_COMMUNITY): Payer: 59

## 2018-07-15 ENCOUNTER — Encounter (HOSPITAL_COMMUNITY)
Admission: RE | Admit: 2018-07-15 | Discharge: 2018-07-15 | Disposition: A | Discharge status: Home / Self Care | Payer: 59 | Source: Ambulatory Visit | Attending: Cardiology | Admitting: Cardiology

## 2018-07-15 DIAGNOSIS — Z951 Presence of aortocoronary bypass graft: Secondary | ICD-10-CM

## 2018-07-17 ENCOUNTER — Encounter (HOSPITAL_BASED_OUTPATIENT_CLINIC_OR_DEPARTMENT_OTHER): Payer: Self-pay | Admitting: Cardiovascular Disease

## 2018-07-17 NOTE — Procedures (Signed)
     Patient Name: Zachary [name], Zachary [name] Date: 06/24/2018 Gender: Male D.O.B: August 03, 1961 Age (years): 46 Referring Provider: Joni Reining NP Height (inches): 69 Interpreting Physician: Nicki Guadalajara MD, ABSM Weight (lbs): 195 RPSGT: [place] BMI: 29 MRN: [id number] Neck Size: 16.50  CLINICAL INFORMATION Sleep Study Type: HST  Indication for sleep study: Snoring  Epworth Sleepiness Score: 2  SLEEP STUDY TECHNIQUE A multi-channel overnight portable sleep study was performed. The channels recorded were: nasal airflow, thoracic respiratory movement, and oxygen saturation with a pulse oximetry. Snoring was also monitored.  MEDICATIONS     acetaminophen (TYLENOL) 325 MG tablet         aspirin EC 81 MG tablet         atorvastatin (LIPITOR) 80 MG tablet         lisinopril (PRINIVIL,ZESTRIL) 2.5 MG tablet         metoprolol tartrate (LOPRESSOR) 25 MG tablet      Patient self administered medications include: N/A.  SLEEP ARCHITECTURE Patient was studied for 394.7 minutes. The sleep efficiency was 97.3 % and the patient was supine for 13.7%. The arousal index was 0.2 per hour.  RESPIRATORY PARAMETERS The overall AHI was 22.3 per hour, with a central apnea index of 0.0 per hour. There was severe sleep apnea with supine position (AHI 78.6/h).  The oxygen nadir was 83% during sleep.  CARDIAC DATA Mean heart rate during sleep was 75.5 bpm.  IMPRESSIONS - Moderate obstructive sleep apnea overall (AHI 22.3/h); howevere severe sleep apnea with supine posture (AHI 78.6/h). - No significant central sleep apnea occurred during this study (CAI = 0.0/h). - Moderate oxygen desaturation to a nadir of 83%; time spent below 89% was 7.0 minutes.. - Patient snored 53.5% during the sleep.  DIAGNOSIS - Obstructive Sleep Apnea (327.23 [G47.33 ICD-10]) - Nocturnal Hypoxemia (327.26 [G47.36 ICD-10])  RECOMMENDATIONS - In this patient with significant cardiovascular comorbidities  including recent CABG, recommend an in lab CPAP titration study. - Positional therapy avoiding supine position during sleep. - Avoid alcohol, sedatives and other CNS depressants that may worsen sleep apnea and disrupt normal sleep architecture. - Sleep hygiene should be reviewed to assess factors that may improve sleep quality. - Weight management and regular exercise should be initiated or continued. - Recommend a download be obtained after initiation of CPAP therapy and sleep clinic evaluation.  [Electronically signed] 07/17/2018 02:57 PM  Nicki Guadalajara MD, Donneta Romberg Diplomate, American Board of Sleep Medicine   NPI: [id number] [facility] PH: [phone]   FX: [phone] ACCREDITED BY THE AMERICAN ACADEMY OF SLEEP MEDICINE

## 2018-07-18 ENCOUNTER — Encounter (HOSPITAL_COMMUNITY)
Admission: RE | Admit: 2018-07-18 | Discharge: 2018-07-18 | Disposition: A | Discharge status: Home / Self Care | Payer: 59 | Source: Ambulatory Visit | Attending: Cardiology | Admitting: Cardiology

## 2018-07-18 ENCOUNTER — Encounter (HOSPITAL_COMMUNITY): Payer: 59

## 2018-07-18 ENCOUNTER — Other Ambulatory Visit: Payer: Self-pay | Admitting: Cardiovascular Disease

## 2018-07-18 ENCOUNTER — Telehealth: Payer: Self-pay | Admitting: *Deleted

## 2018-07-18 DIAGNOSIS — Z951 Presence of aortocoronary bypass graft: Secondary | ICD-10-CM

## 2018-07-18 DIAGNOSIS — G4733 Obstructive sleep apnea (adult) (pediatric): Secondary | ICD-10-CM

## 2018-07-18 NOTE — Progress Notes (Signed)
Will you please set up an appointment with you to get things started concerning CPAP due to newly diagnosed OSA?  He has not been given the results yet of the sleep study. You can let him know about the results as well if you don't mind. Thank you!!

## 2018-07-18 NOTE — Progress Notes (Signed)
Zachary [name] 57 y.o. male Nutrition Note Spoke with pt. Nutrition plan and goals reviewed with pt. Pt is following Step 2 of the Therapeutic Lifestyle Changes diet. Pt wants to lose wt. Pt has not been trying to lose wt actively. Wt loss tips from orientation reviewed with patient today(label reading, how to build a healthy plate, portion sizes, eating frequently across the day). Pt shared he drinks sweet tea daily 1-2x day uses sugar in his coffee, recommended pt decrease sugar sweetened beverages as this can raise blood glucose levels, pt has started to drink 1/2 and 1/2 sweet tea in an effort to decrease sugar consumption. He has also limited the number of teas daily to 1. Praised pt for this change and encouraged him to keep it up. Pt is pre-diabetic. Last A1c 5.9, recommended pt swap refined carbohydrates for complex carbohydrates to manage blood glucose. Per discussion, pt continues to not use canned/convenience foods often. Pt rarely adds salt to food. Pt eats out 2x a week, since discussing reduction at orientation, in an effort to help control refined carbs and sodium consumption. Pt expressed understanding of the information reviewed. Pt aware of nutrition education classes offered and plans on attending nutrition classes.   Lab Results  Component Value Date   HGBA1C 6.0 (H) 06/30/2018    Wt Readings from Last 3 Encounters:  07/14/18 204 lb (92.5 kg)  07/04/18 204 lb (92.5 kg)  06/23/18 201 lb 4.5 oz (91.3 kg)    Nutrition Diagnosis  Food-and nutrition-related knowledge deficit related to lack of exposure to information as related to diagnosis of: ? CVD ? Pre-DM Nutrition Intervention ? Pt's individual nutrition plan reviewed with pt. ? Benefits of adopting Heart Healthy diet discussed when Medficts reviewed.   ? Pt given handouts for: ? Nutrition I class ? Nutrition II class ? Diabetes Blitz Class ? Consistent vit K diet ? low sodium ? DM ? pre-diabetes ? Diabetes Q & A  class ? Continue client-centered nutrition education by RD, as part of interdisciplinary care.  Goal(s)  Pt to identify and limit food sources of saturated fat, trans fat, refined carbohydrates and sodium  Pt to identify food quantities necessary to achieve weight loss of 6-24 lbs. at graduation from cardiac rehab. Goal wt of 185 lb desired.   Pt able to name foods that affect blood glucose.  Plan:   Pt to attend nutrition classes ? Nutrition I ? Nutrition II ? Portion Distortion   Will provide client-centered nutrition education as part of interdisciplinary care  Monitor and evaluate progress toward nutrition goal with team.    Ross Marcus, MS, RD, LDN 07/18/2018 9:13 AM

## 2018-07-18 NOTE — Telephone Encounter (Signed)
Left message to return a call to discuss sleep study. 

## 2018-07-19 ENCOUNTER — Telehealth: Payer: Self-pay | Admitting: *Deleted

## 2018-07-19 NOTE — Telephone Encounter (Signed)
Patient returned a call to me and was notified of HST results and recommendations. Patient voiced verbal understanding of the information proivided to him.

## 2018-07-19 NOTE — Telephone Encounter (Signed)
Left message to return a call to discuss sleep study. 

## 2018-07-19 NOTE — Telephone Encounter (Signed)
-----   Message from Jodelle Gross, NP sent at 07/18/2018  7:22 AM EDT -----   ----- Message ----- From: Lennette Bihari, MD Sent: 07/17/2018   3:03 PM EDT To: Jodelle Gross, NP, Margaree Mackintosh, MD

## 2018-07-19 NOTE — Telephone Encounter (Signed)
Faxed PA request for CPAP titration.

## 2018-07-20 ENCOUNTER — Encounter (HOSPITAL_COMMUNITY): Payer: 59

## 2018-07-20 ENCOUNTER — Encounter (HOSPITAL_COMMUNITY)
Admission: RE | Admit: 2018-07-20 | Discharge: 2018-07-20 | Disposition: A | Discharge status: Home / Self Care | Payer: 59 | Source: Ambulatory Visit | Attending: Cardiology | Admitting: Cardiology

## 2018-07-20 DIAGNOSIS — Z951 Presence of aortocoronary bypass graft: Secondary | ICD-10-CM

## 2018-07-22 ENCOUNTER — Encounter (HOSPITAL_COMMUNITY): Payer: 59

## 2018-07-22 ENCOUNTER — Encounter (HOSPITAL_COMMUNITY)
Admission: RE | Admit: 2018-07-22 | Discharge: 2018-07-22 | Disposition: A | Discharge status: Home / Self Care | Payer: 59 | Source: Ambulatory Visit | Attending: Cardiology | Admitting: Cardiology

## 2018-07-22 DIAGNOSIS — Z951 Presence of aortocoronary bypass graft: Secondary | ICD-10-CM

## 2018-07-24 NOTE — Patient Instructions (Signed)
Flu vaccine given.  Take multivitamin daily.  May take folate supplement 1 mg daily.  Follow-up in 3 months.  Pulmonary nodule will need reimaging in about 6 months.  Take 2000 units vitamin D3 daily

## 2018-07-25 ENCOUNTER — Encounter (HOSPITAL_COMMUNITY): Payer: 59

## 2018-07-25 ENCOUNTER — Encounter (HOSPITAL_COMMUNITY)
Admission: RE | Admit: 2018-07-25 | Discharge: 2018-07-25 | Disposition: A | Discharge status: Home / Self Care | Payer: 59 | Source: Ambulatory Visit | Attending: Cardiology | Admitting: Cardiology

## 2018-07-25 DIAGNOSIS — Z951 Presence of aortocoronary bypass graft: Secondary | ICD-10-CM | POA: Diagnosis not present

## 2018-07-27 ENCOUNTER — Encounter (HOSPITAL_COMMUNITY): Payer: 59

## 2018-07-27 ENCOUNTER — Encounter (HOSPITAL_COMMUNITY)
Admission: RE | Admit: 2018-07-27 | Discharge: 2018-07-27 | Disposition: A | Discharge status: Home / Self Care | Payer: 59 | Source: Ambulatory Visit | Attending: Cardiology | Admitting: Cardiology

## 2018-07-27 DIAGNOSIS — Z951 Presence of aortocoronary bypass graft: Secondary | ICD-10-CM | POA: Diagnosis not present

## 2018-07-27 DIAGNOSIS — Z87891 Personal history of nicotine dependence: Secondary | ICD-10-CM | POA: Insufficient documentation

## 2018-07-27 DIAGNOSIS — Z7982 Long term (current) use of aspirin: Secondary | ICD-10-CM | POA: Diagnosis not present

## 2018-07-27 DIAGNOSIS — Z79899 Other long term (current) drug therapy: Secondary | ICD-10-CM | POA: Insufficient documentation

## 2018-07-27 DIAGNOSIS — Z79891 Long term (current) use of opiate analgesic: Secondary | ICD-10-CM | POA: Diagnosis not present

## 2018-07-27 DIAGNOSIS — E785 Hyperlipidemia, unspecified: Secondary | ICD-10-CM | POA: Insufficient documentation

## 2018-07-28 NOTE — Progress Notes (Signed)
Cardiac Individual Treatment Plan  Patient Details  Name: Zachary [name] MRN: [id number] Date of Birth: August 16, 1961 Referring Provider:   Flowsheet Row CARDIAC REHAB PHASE II ORIENTATION from 06/23/2018 in [facility] CARDIAC [facility]  Referring Provider  Lewayne Bunting MD       Initial Encounter Date:  Flowsheet Row CARDIAC REHAB PHASE II ORIENTATION from 06/23/2018 in [facility] CARDIAC REHAB  Date  06/23/18      Visit Diagnosis: S/P CABG x 3  Patient's Home Medications on Admission:  Current Outpatient Medications:  .  acetaminophen (TYLENOL) 325 MG tablet, Take 325 mg by mouth every 6 (six) hours as needed., Disp: , Rfl:  .  aspirin EC 81 MG tablet, Take 81 mg by mouth daily., Disp: , Rfl:  .  atorvastatin (LIPITOR) 80 MG tablet, Take 1 tablet (80 mg total) by mouth daily at 6 PM., Disp: 30 tablet, Rfl: 1 .  lisinopril (PRINIVIL,ZESTRIL) 2.5 MG tablet, Take 1 tablet (2.5 mg total) by mouth daily., Disp: 30 tablet, Rfl: 1 .  metoprolol tartrate (LOPRESSOR) 25 MG tablet, TAKE 0.5 TABLETS (12.5 MG TOTAL) BY MOUTH 2 (TWO) TIMES DAILY., Disp: 90 tablet, Rfl: 1  Past Medical History: Past Medical History:  Diagnosis Date  . Hyperlipemia     Tobacco Use: Social History   Tobacco Use  Smoking Status Former Smoker  . Types: Cigarettes  . Start date: 10/26/1982  . Last attempt to quit: 04/20/2018  . Years since quitting: 0.2  Smokeless Tobacco Never Used  Tobacco Comment   smoked about 20 cigarrets every 2 days.     Labs: Recent Review Flowsheet Data    Labs for ITP Cardiac and Pulmonary Rehab Latest Ref Rng & Units 04/23/2018 04/23/2018 04/23/2018 04/24/2018 06/30/2018   Cholestrol <200 mg/dL - - - - 409   LDLCALC mg/dL (calc) - - - - 65   HDL >40 mg/dL - - - - 44   Trlycerides <150 mg/dL - - - - 67   Hemoglobin A1c <5.7 % of total Hgb - - - - 6.0(H)   PHART 7.350 - 7.450 7.343(L) 7.357 - - -   PCO2ART 32.0 - 48.0 mmHg 43.2 43.0 - - -   HCO3  20.0 - 28.0 mmol/L 23.6 24.2 - - -   TCO2 22 - 32 mmol/L 25 26 22 23  -   ACIDBASEDEF 0.0 - 2.0 mmol/L 2.0 1.0 - - -   O2SAT % 97.0 98.0 - - -      Capillary Blood Glucose: Lab Results  Component Value Date   GLUCAP 122 (H) 04/27/2018   GLUCAP 159 (H) 04/26/2018   GLUCAP 125 (H) 04/26/2018   GLUCAP 67 (L) 04/26/2018   GLUCAP 136 (H) 04/26/2018     Exercise Target Goals: Exercise Program Goal: Individual exercise prescription set using results from initial 6 min walk test and THRR while considering  patient's activity barriers and safety.   Exercise Prescription Goal: Initial exercise prescription builds to 30-45 minutes a day of aerobic activity, 2-3 days per week.  Home exercise guidelines will be given to patient during program as part of exercise prescription that the participant will acknowledge.  Activity Barriers & Risk Stratification: Activity Barriers & Cardiac Risk Stratification - 06/23/18 1209    Activity Barriers & Cardiac Risk Stratification          Activity Barriers  None    Cardiac Risk Stratification  High  6 Minute Walk: 6 Minute Walk    6 Minute Walk    Row Name 06/23/18 1208   Phase  Initial   Distance  1652 feet   Walk Time  6 minutes   # of Rest Breaks  0   MPH  3.13   METS  3.85   RPE  10   Perceived Dyspnea   0   VO2 Peak  13.5   Symptoms  No   Resting HR  66 bpm   Resting BP  110/64   Resting Oxygen Saturation   98 %   Exercise Oxygen Saturation  during 6 min walk  99 %   Max Ex. HR  95 bpm   Max Ex. BP  120/70   2 Minute Post BP  128/74          Oxygen Initial Assessment:   Oxygen Re-Evaluation:   Oxygen Discharge (Final Oxygen Re-Evaluation):   Initial Exercise Prescription: Initial Exercise Prescription - 06/23/18 1200    Date of Initial Exercise RX and Referring Provider          Date  06/23/18    Referring Provider  Lewayne Bunting MD     Expected Discharge Date  09/16/18        Treadmill           MPH  3    Grade  0    Minutes  10    METs  3.3        Bike          Level  1    Minutes  10    METs  3.1        NuStep          Level  2    SPM  80    Minutes  10    METs  2.8        Prescription Details          Frequency (times per week)  3x    Duration  Progress to 30 minutes of continuous aerobic without signs/symptoms of physical distress        Intensity          THRR 40-80% of Max Heartrate  64-129    Ratings of Perceived Exertion  11-13    Perceived Dyspnea  0-4        Progression          Progression  Continue progressive overload as per policy without signs/symptoms or physical distress.        Resistance Training          Training Prescription  Yes    Weight  3lbs    Reps  10-15           Perform Capillary Blood Glucose checks as needed.  Exercise Prescription Changes: Exercise Prescription Changes    Response to Exercise    Row Name 06/29/18 1000 07/11/18 1608 07/27/18 1233   Blood Pressure (Admit)  114/64  124/60  119/72   Blood Pressure (Exercise)  138/78  128/60  112/72   Blood Pressure (Exit)  114/70  108/60  122/70   Heart Rate (Admit)  70 bpm  68 bpm  63 bpm   Heart Rate (Exercise)  97 bpm  98 bpm  120 bpm   Heart Rate (Exit)  70 bpm  78 bpm  73 bpm   Rating of Perceived Exertion (Exercise)  11  12  13    Perceived Dyspnea (Exercise)  0  0  0   Symptoms  None   None   None    Comments  Pt oriented to exercise equipment   no documentation  Added Elliptical to Exercise Prescription   Duration  Continue with 45 min of aerobic exercise without signs/symptoms of physical distress.  Continue with 45 min of aerobic exercise without signs/symptoms of physical distress.  Continue with 45 min of aerobic exercise without signs/symptoms of physical distress.   Intensity  THRR unchanged  THRR unchanged  THRR unchanged       Progression    Row Name 06/29/18 1000 07/11/18 1608 07/27/18 1233   Progression  Continue to progress workloads to  maintain intensity without signs/symptoms of physical distress.  Continue to progress workloads to maintain intensity without signs/symptoms of physical distress.  Continue to progress workloads to maintain intensity without signs/symptoms of physical distress.   Average METs  2.95  4.1  6.91       Resistance Training    Row Name 06/29/18 1000 07/11/18 1608 07/27/18 1233   Training Prescription  No  Yes  No   Weight  no documentation  5lbs  no documentation   Reps  no documentation  10-15  no documentation   Time  no documentation  10 Minutes  no documentation       Interval Training    Row Name 06/29/18 1000 07/11/18 1608 07/27/18 1233   Interval Training  No  No  No       Treadmill    Row Name 06/29/18 1000 07/11/18 1608 07/27/18 1233   MPH  3  3  3.7   Grade  0  2  3.5   Minutes  10  10  10    METs  3.3  4.12  5.62       Bike    Row Name 06/29/18 1000 07/11/18 1608 07/27/18 1233   Level  1  2  no documentation   Minutes  10  10  no documentation   METs  3.07  5.1  no documentation       NuStep    Row Name 06/29/18 1000 07/11/18 1608 07/27/18 1233   Level  2  5  5    SPM  85  95  115   Minutes  10  10  10    METs  2.5  3.2  8.2       Elliptical    Row Name 06/29/18 1000 07/11/18 1608 07/27/18 1233   Level  no documentation  no documentation  2   Speed  no documentation  no documentation  2   Minutes  no documentation  no documentation  10   METs  no documentation  no documentation  8.2       Home Exercise Plan    Row Name 06/29/18 1000 07/11/18 1608 07/27/18 1233   Plans to continue exercise at  no documentation  [facility] ([facility]  [facility] ([facility]   Frequency  no documentation  Add 2 additional days to program exercise sessions.  Add 2 additional days to program exercise sessions.   Initial Home Exercises Provided  no documentation  07/11/18  07/11/18          Exercise Comments: Exercise Comments     Row Name 06/29/18 1003 07/11/18 1518   Exercise Comments  Pt oriented to exericse equipment. Pt's first day of exericse. Pt responded well to exercise and is off to good  start. Will continue to monitor.   Reviewed HEP with pt. Pt verbalized understanding the importance of adding exercise to weekly routine in addition to his golf routine. Pt interested in trying elliptical in rehab. Will work with pt to start new machine this week.        Exercise Goals and Review: Exercise Goals    Exercise Goals    Row Name 06/23/18 1209   Increase Physical Activity  Yes   Intervention  Provide advice, education, support and counseling about physical activity/exercise needs.;Develop an individualized exercise prescription for aerobic and resistive training based on initial evaluation findings, risk stratification, comorbidities and participant's personal goals.   Expected Outcomes  Short Term: Attend rehab on a regular basis to increase amount of physical activity.;Long Term: Add in home exercise to make exercise part of routine and to increase amount of physical activity.;Long Term: Exercising regularly at least 3-5 days a week.   Increase Strength and Stamina  Yes   Intervention  Provide advice, education, support and counseling about physical activity/exercise needs.;Develop an individualized exercise prescription for aerobic and resistive training based on initial evaluation findings, risk stratification, comorbidities and participant's personal goals.   Expected Outcomes  Short Term: Increase workloads from initial exercise prescription for resistance, speed, and METs.;Long Term: Improve cardiorespiratory fitness, muscular endurance and strength as measured by increased METs and functional capacity ( );Short Term: Perform resistance training exercises routinely during rehab and add in resistance training at home   Able to understand and use rate of perceived exertion (RPE) scale  Yes   Intervention   Provide education and explanation on how to use RPE scale   Expected Outcomes  Short Term: Able to use RPE daily in rehab to express subjective intensity level;Long Term:  Able to use RPE to guide intensity level when exercising independently   Knowledge and understanding of Target Heart Rate Range (THRR)  Yes   Intervention  Provide education and explanation of THRR including how the numbers were predicted and where they are located for reference   Expected Outcomes  Short Term: Able to state/look up THRR;Long Term: Able to use THRR to govern intensity when exercising independently;Short Term: Able to use daily as guideline for intensity in rehab   Able to check pulse independently  Yes   Intervention  Provide education and demonstration on how to check pulse in carotid and radial arteries.;Review the importance of being able to check your own pulse for safety during independent exercise   Expected Outcomes  Short Term: Able to explain why pulse checking is important during independent exercise;Long Term: Able to check pulse independently and accurately   Intervention  Provide education, explanation, and written materials on patient's individual exercise prescription   Expected Outcomes  Short Term: Able to explain program exercise prescription;Long Term: Able to explain home exercise prescription to exercise independently          Exercise Goals Re-Evaluation : Exercise Goals Re-Evaluation    Exercise Goal Re-Evaluation    Row Name 07/11/18 1515   Exercise Goals Review  Increase Physical Activity;Able to understand and use rate of perceived exertion (RPE) scale;Knowledge and understanding of Target Heart Rate Range (THRR);Understanding of Exercise Prescription;Increase Strength and Stamina;Able to check pulse independently   Comments  Reviewed HEP with pt. Also reviewed THRR, RPE Scale, weather precautions, endpoints of exericse, NTG use, warmup and cool down. Explained to pt the difference  between physical activity and exercise. Pt golfs alot, but does not exercise.   Expected  Outcomes  Pt will plan to walk for exercise 2-3 days a week, 30-45 minutes. Pt plans to start going to gym near home. Will continue to follow up with pt.           Discharge Exercise Prescription (Final Exercise Prescription Changes): Exercise Prescription Changes - 07/27/18 1233    Response to Exercise          Blood Pressure (Admit)  119/72    Blood Pressure (Exercise)  112/72    Blood Pressure (Exit)  122/70    Heart Rate (Admit)  63 bpm    Heart Rate (Exercise)  120 bpm    Heart Rate (Exit)  73 bpm    Rating of Perceived Exertion (Exercise)  13    Perceived Dyspnea (Exercise)  0    Symptoms  None     Comments  Added Elliptical to Exercise Prescription    Duration  Continue with 45 min of aerobic exercise without signs/symptoms of physical distress.    Intensity  THRR unchanged        Progression          Progression  Continue to progress workloads to maintain intensity without signs/symptoms of physical distress.    Average METs  6.91        Resistance Training          Training Prescription  No        Interval Training          Interval Training  No        Treadmill          MPH  3.7    Grade  3.5    Minutes  10    METs  5.62        NuStep          Level  5    SPM  115    Minutes  10    METs  8.2        Elliptical          Level  2    Speed  2    Minutes  10    METs  8.2        Home Exercise Plan          Plans to continue exercise at  [employer] (comment)   [facility], Walking   Frequency  Add 2 additional days to program exercise sessions.    Initial Home Exercises Provided  07/11/18           Nutrition:  Target Goals: Understanding of nutrition guidelines, daily intake of sodium 1500mg , cholesterol 200mg , calories 30% from fat and 7% or less from saturated fats, daily to have 5 or more servings of fruits and  vegetables.  Biometrics: Pre Biometrics - 06/23/18 1210    Pre Biometrics          Height  5' 8.5" (1.74 m)    Weight  91.3 kg    Waist Circumference  39 inches    Hip Circumference  41 inches    Waist to Hip Ratio  0.95 %    BMI (Calculated)  30.16    Triceps Skinfold  12 mm    % Body Fat  26.7 %    Grip Strength  40 kg    Flexibility  15 in    Single Leg Stand  30 seconds            Nutrition Therapy Plan and Nutrition Goals: Nutrition  Therapy & Goals - 06/23/18 1025    Nutrition Therapy          Diet  heart healthy carb modified  (Pended)            Nutrition Assessments:   Nutrition Goals Re-Evaluation:   Nutrition Goals Re-Evaluation:   Nutrition Goals Discharge (Final Nutrition Goals Re-Evaluation):   Psychosocial: Target Goals: Acknowledge presence or absence of significant depression and/or stress, maximize coping skills, provide positive support system. Participant is able to verbalize types and ability to use techniques and skills needed for reducing stress and depression.  Initial Review & Psychosocial Screening: Initial Psych Review & Screening - 06/23/18 1053    Initial Review          Current issues with  None Identified        Family Dynamics          Good Support System?  Yes   Pt lists his wife, children, grandparents, and golf buddies as sources of support.        Barriers          Psychosocial barriers to participate in program  There are no identifiable barriers or psychosocial needs.        Screening Interventions          Interventions  Encouraged to exercise           Quality of Life Scores: Quality of Life - 06/23/18 1120    Quality of Life          Select  Quality of Life        Quality of Life Scores          Health/Function Pre  22.2 %    Socioeconomic Pre  22.5 %    Psych/Spiritual Pre  22.71 %    Family Pre  24.5 %    GLOBAL Pre  22.7 %          Scores of 19 and below usually indicate a poorer  quality of life in these areas.  A difference of  2-3 points is a clinically meaningful difference.  A difference of 2-3 points in the total score of the Quality of Life Index has been associated with significant improvement in overall quality of life, self-image, physical symptoms, and general health in studies assessing change in quality of life.  PHQ-9: Recent Review Flowsheet Data    Depression screen [facility] 2/9 05/10/2018   Decreased Interest 0   Down, Depressed, Hopeless 0   PHQ - 2 Score 0     Interpretation of Total Score  Total Score Depression Severity:  1-4 = Minimal depression, 5-9 = Mild depression, 10-14 = Moderate depression, 15-19 = Moderately severe depression, 20-27 = Severe depression   Psychosocial Evaluation and Intervention: Psychosocial Evaluation - 06/29/18 1128    Psychosocial Evaluation & Interventions          Interventions  Encouraged to exercise with the program and follow exercise prescription    Comments  no psychosocial needs identified, no interventions necessary. pt enjoys playing golf, has been putting, chipping and 3/4 swings following Dr. Dennie Maizes recommendation.     Expected Outcomes  pt will exhibit positive outlook with good coping skills.     Continue Psychosocial Services   No Follow up required           Psychosocial Re-Evaluation: Psychosocial Re-Evaluation    Psychosocial Re-Evaluation    Row Name 07/20/18 1116   Current issues with  None Identified  Comments  no psychosocial needs identified, no interventions necessary    Expected Outcomes  pt will exhibit positive outlook with good coping skills.    Interventions  Encouraged to attend Cardiac Rehabilitation for the exercise   Continue Psychosocial Services   No Follow up required          Psychosocial Discharge (Final Psychosocial Re-Evaluation): Psychosocial Re-Evaluation - 07/20/18 1116    Psychosocial Re-Evaluation          Current issues with  None Identified    Comments   no psychosocial needs identified, no interventions necessary     Expected Outcomes  pt will exhibit positive outlook with good coping skills.     Interventions  Encouraged to attend Cardiac Rehabilitation for the exercise    Continue Psychosocial Services   No Follow up required           Vocational Rehabilitation: Provide vocational rehab assistance to qualifying candidates.   Vocational Rehab Evaluation & Intervention: Vocational Rehab - 06/23/18 1121    Initial Vocational Rehab Evaluation & Intervention          Assessment shows need for Vocational Rehabilitation  No           Education: Education Goals: Education classes will be provided on a weekly basis, covering required topics. Participant will state understanding/return demonstration of topics presented.  Learning Barriers/Preferences: Learning Barriers/Preferences - 06/23/18 1245    Learning Barriers/Preferences          Learning Barriers  Sight    Learning Preferences  Skilled Demonstration           Education Topics: Count Your Pulse:  -Group instruction provided by verbal instruction, demonstration, patient participation and written materials to support subject.  Instructors address importance of being able to find your pulse and how to count your pulse when at home without a heart monitor.  Patients get hands on experience counting their pulse with staff help and individually.   Heart Attack, Angina, and Risk Factor Modification:  -Group instruction provided by verbal instruction, video, and written materials to support subject.  Instructors address signs and symptoms of angina and heart attacks.    Also discuss risk factors for heart disease and how to make changes to improve heart health risk factors.   Functional Fitness:  -Group instruction provided by verbal instruction, demonstration, patient participation, and written materials to support subject.  Instructors address safety measures for doing things  around the house.  Discuss how to get up and down off the floor, how to pick things up properly, how to safely get out of a chair without assistance, and balance training.   Meditation and Mindfulness:  -Group instruction provided by verbal instruction, patient participation, and written materials to support subject.  Instructor addresses importance of mindfulness and meditation practice to help reduce stress and improve awareness.  Instructor also leads participants through a meditation exercise.    Stretching for Flexibility and Mobility:  -Group instruction provided by verbal instruction, patient participation, and written materials to support subject.  Instructors lead participants through series of stretches that are designed to increase flexibility thus improving mobility.  These stretches are additional exercise for major muscle groups that are typically performed during regular warm up and cool down.   Hands Only CPR:  -Group verbal, video, and participation provides a basic overview of AHA guidelines for community CPR. Role-play of emergencies allow participants the opportunity to practice calling for help and chest compression technique with discussion of AED use.  Hypertension: -Group verbal and written instruction that provides a basic overview of hypertension including the most recent diagnostic guidelines, risk factor reduction with self-care instructions and medication management.    Nutrition I class: Heart Healthy Eating:  -Group instruction provided by PowerPoint slides, verbal discussion, and written materials to support subject matter. The instructor gives an explanation and review of the Therapeutic Lifestyle Changes diet recommendations, which includes a discussion on lipid goals, dietary fat, sodium, fiber, plant stanol/sterol esters, sugar, and the components of a well-balanced, healthy diet.   Nutrition II class: Lifestyle Skills:  -Group instruction provided by  PowerPoint slides, verbal discussion, and written materials to support subject matter. The instructor gives an explanation and review of label reading, grocery shopping for heart health, heart healthy recipe modifications, and ways to make healthier choices when eating out.   Diabetes Question & Answer:  -Group instruction provided by PowerPoint slides, verbal discussion, and written materials to support subject matter. The instructor gives an explanation and review of diabetes co-morbidities, pre- and post-prandial blood glucose goals, pre-exercise blood glucose goals, signs, symptoms, and treatment of hypoglycemia and hyperglycemia, and foot care basics.   Diabetes Blitz:  -Group instruction provided by PowerPoint slides, verbal discussion, and written materials to support subject matter. The instructor gives an explanation and review of the physiology behind type 1 and type 2 diabetes, diabetes medications and rational behind using different medications, pre- and post-prandial blood glucose recommendations and Hemoglobin A1c goals, diabetes diet, and exercise including blood glucose guidelines for exercising safely.    Portion Distortion:  -Group instruction provided by PowerPoint slides, verbal discussion, written materials, and food models to support subject matter. The instructor gives an explanation of serving size versus portion size, changes in portions sizes over the last 20 years, and what consists of a serving from each food group.   Stress Management:  -Group instruction provided by verbal instruction, video, and written materials to support subject matter.  Instructors review role of stress in heart disease and how to cope with stress positively.     Exercising on Your Own:  -Group instruction provided by verbal instruction, power point, and written materials to support subject.  Instructors discuss benefits of exercise, components of exercise, frequency and intensity of exercise,  and end points for exercise.  Also discuss use of nitroglycerin and activating EMS.  Review options of places to exercise outside of rehab.  Review guidelines for sex with heart disease.   Cardiac Drugs I:  -Group instruction provided by verbal instruction and written materials to support subject.  Instructor reviews cardiac drug classes: antiplatelets, anticoagulants, beta blockers, and statins.  Instructor discusses reasons, side effects, and lifestyle considerations for each drug class. Flowsheet Row CARDIAC REHAB PHASE II EXERCISE from 07/06/2018 in [facility] CARDIAC REHAB  Date  07/06/18  Instruction Review Code  2- Demonstrated Understanding      Cardiac Drugs II:  -Group instruction provided by verbal instruction and written materials to support subject.  Instructor reviews cardiac drug classes: angiotensin converting enzyme inhibitors (ACE-I), angiotensin II receptor blockers (ARBs), nitrates, and calcium channel blockers.  Instructor discusses reasons, side effects, and lifestyle considerations for each drug class.   Anatomy and Physiology of the Circulatory System:  Group verbal and written instruction and models provide basic cardiac anatomy and physiology, with the coronary electrical and arterial systems. Review of: AMI, Angina, Valve disease, Heart Failure, Peripheral Artery Disease, Cardiac Arrhythmia, Pacemakers, and the ICD.   Other Education:  -Group or  individual verbal, written, or video instructions that support the educational goals of the cardiac rehab program.   Holiday Eating Survival Tips:  -Group instruction provided by PowerPoint slides, verbal discussion, and written materials to support subject matter. The instructor gives patients tips, tricks, and techniques to help them not only survive but enjoy the holidays despite the onslaught of food that accompanies the holidays.   Knowledge Questionnaire Score: Knowledge Questionnaire Score -  06/23/18 1120    Knowledge Questionnaire Score          Pre Score  22/24           Core Components/Risk Factors/Patient Goals at Admission: Personal Goals and Risk Factors at Admission - 06/23/18 1332    Core Components/Risk Factors/Patient Goals on Admission          Intervention  Weight Management: Develop a combined nutrition and exercise program designed to reach desired caloric intake, while maintaining appropriate intake of nutrient and fiber, sodium and fats, and appropriate energy expenditure required for the weight goal.;Weight Management/Obesity: Establish reasonable short term and long term weight goals.;Weight Management: Provide education and appropriate resources to help participant work on and attain dietary goals.;Obesity: Provide education and appropriate resources to help participant work on and attain dietary goals.    Admit Weight  201 lb 4.5 oz (91.3 kg)    Goal Weight: Short Term  190 lb (86.2 kg)    Goal Weight: Long Term  185 lb (83.9 kg)    Expected Outcomes  Long Term: Adherence to nutrition and physical activity/exercise program aimed toward attainment of established weight goal;Short Term: Continue to assess and modify interventions until short term weight is achieved;Weight Loss: Understanding of general recommendations for a balanced deficit meal plan, which promotes 1-2 lb weight loss per week and includes a negative energy balance of [phone] kcal/d;Understanding recommendations for meals to include 15-35% energy as protein, 25-35% energy from fat, 35-60% energy from carbohydrates, less than 200mg  of dietary cholesterol, 20-35 gm of total fiber daily;Understanding of distribution of calorie intake throughout the day with the consumption of 4-5 meals/snacks;Weight Gain: Understanding of general recommendations for a high calorie, high protein meal plan that promotes weight gain by distributing calorie intake throughout the day with the consumption for 4-5 meals,  snacks, and/or supplements    Hypertension  Yes    Intervention  Provide education on lifestyle modifcations including regular physical activity/exercise, weight management, moderate sodium restriction and increased consumption of fresh fruit, vegetables, and low fat dairy, alcohol moderation, and smoking cessation.;Monitor prescription use compliance.    Expected Outcomes  Short Term: Continued assessment and intervention until BP is < 140/40mm HG in hypertensive participants. < 130/59mm HG in hypertensive participants with diabetes, heart failure or chronic kidney disease.;Long Term: Maintenance of blood pressure at goal levels.    Lipids  Yes    Intervention  Provide education and support for participant on nutrition & aerobic/resistive exercise along with prescribed medications to achieve LDL 70mg , HDL >40mg .    Expected Outcomes  Short Term: Participant states understanding of desired cholesterol values and is compliant with medications prescribed. Participant is following exercise prescription and nutrition guidelines.;Long Term: Cholesterol controlled with medications as prescribed, with individualized exercise RX and with personalized nutrition plan. Value goals: LDL < 70mg , HDL > 40 mg.           Core Components/Risk Factors/Patient Goals Review:  Goals and Risk Factor Review    Core Components/Risk Factors/Patient Goals Review    Row Name 06/29/18  1129 07/20/18 1117   Personal Goals Review  Weight Management/Obesity;Hypertension;Lipids  Weight Management/Obesity;Hypertension;Lipids   Review  pt with significant CAD RF demonstrates eagerness to participate in CR program. pt personal goal is to lose weight and complete CR program to gain insight and guidance to make healthy changes.    pt with significant CAD RF demonstrates eagerness to participate in CR program. pt personal goal is to lose weight and complete CR program to gain insight and guidance to make healthy changes.  pt  demonstrates significant CR workload progression  and enjoys using elliptical.    Expected Outcomes  pt will participate in CR exercise, nutrition and lifestyle modification to decrease overall CAD RF.    pt will participate in CR exercise, nutrition and lifestyle modification to decrease overall CAD RF.            Core Components/Risk Factors/Patient Goals at Discharge (Final Review):  Goals and Risk Factor Review - 07/20/18 1117    Core Components/Risk Factors/Patient Goals Review          Personal Goals Review  Weight Management/Obesity;Hypertension;Lipids    Review  pt with significant CAD RF demonstrates eagerness to participate in CR program. pt personal goal is to lose weight and complete CR program to gain insight and guidance to make healthy changes.  pt demonstrates significant CR workload progression  and enjoys using elliptical.     Expected Outcomes  pt will participate in CR exercise, nutrition and lifestyle modification to decrease overall CAD RF.             ITP Comments: ITP Comments    Row Name 06/23/18 1052 06/29/18 1127 07/19/18 1533   ITP Comments  Dr. Armanda Magic, Medical Director  pt started group exercise session. pt tolerated light activity without difficulty. pt oriented to exercise equipment and safety routine.   30 day ITP review.  pt with good attendance and participation.       Comments:

## 2018-07-29 ENCOUNTER — Encounter (HOSPITAL_COMMUNITY): Payer: 59

## 2018-07-29 ENCOUNTER — Telehealth: Payer: Self-pay | Admitting: *Deleted

## 2018-07-29 ENCOUNTER — Encounter (HOSPITAL_COMMUNITY)
Admission: RE | Admit: 2018-07-29 | Discharge: 2018-07-29 | Disposition: A | Discharge status: Home / Self Care | Payer: 59 | Source: Ambulatory Visit | Attending: Cardiology | Admitting: Cardiology

## 2018-07-29 DIAGNOSIS — Z951 Presence of aortocoronary bypass graft: Secondary | ICD-10-CM | POA: Diagnosis not present

## 2018-07-29 NOTE — Telephone Encounter (Signed)
Patient notified [facility] authorization received for CPAP titration. Appointment scheduled for 08/29/18.

## 2018-07-29 NOTE — Telephone Encounter (Signed)
-----   Message from Gaynelle Cage, CMA sent at 07/18/2018  8:28 AM EDT ----- CPAP titration.

## 2018-08-01 ENCOUNTER — Encounter (HOSPITAL_COMMUNITY)
Admission: RE | Admit: 2018-08-01 | Discharge: 2018-08-01 | Disposition: A | Discharge status: Home / Self Care | Payer: 59 | Source: Ambulatory Visit | Attending: Cardiology | Admitting: Cardiology

## 2018-08-01 ENCOUNTER — Encounter (HOSPITAL_COMMUNITY): Payer: 59

## 2018-08-01 DIAGNOSIS — Z951 Presence of aortocoronary bypass graft: Secondary | ICD-10-CM | POA: Diagnosis not present

## 2018-08-03 ENCOUNTER — Encounter (HOSPITAL_COMMUNITY): Payer: 59

## 2018-08-03 ENCOUNTER — Encounter (HOSPITAL_COMMUNITY)
Admission: RE | Admit: 2018-08-03 | Discharge: 2018-08-03 | Disposition: A | Discharge status: Home / Self Care | Payer: 59 | Source: Ambulatory Visit | Attending: Cardiology | Admitting: Cardiology

## 2018-08-03 DIAGNOSIS — Z951 Presence of aortocoronary bypass graft: Secondary | ICD-10-CM

## 2018-08-05 ENCOUNTER — Encounter (HOSPITAL_COMMUNITY): Payer: 59

## 2018-08-05 ENCOUNTER — Encounter (HOSPITAL_COMMUNITY)
Admission: RE | Admit: 2018-08-05 | Discharge: 2018-08-05 | Disposition: A | Discharge status: Home / Self Care | Payer: 59 | Source: Ambulatory Visit | Attending: Cardiology | Admitting: Cardiology

## 2018-08-05 DIAGNOSIS — Z951 Presence of aortocoronary bypass graft: Secondary | ICD-10-CM

## 2018-08-08 ENCOUNTER — Encounter (HOSPITAL_COMMUNITY): Payer: 59

## 2018-08-08 ENCOUNTER — Encounter (HOSPITAL_COMMUNITY)
Admission: RE | Admit: 2018-08-08 | Discharge: 2018-08-08 | Disposition: A | Discharge status: Home / Self Care | Payer: 59 | Source: Ambulatory Visit | Attending: Cardiology | Admitting: Cardiology

## 2018-08-08 DIAGNOSIS — Z951 Presence of aortocoronary bypass graft: Secondary | ICD-10-CM

## 2018-08-09 NOTE — Progress Notes (Signed)
HPI: FU CAD.  Patient suffered a non-ST elevation myocardial infarction June 2019.  Cardiac catheterization at that time revealed severe three-vessel coronary artery disease and normal LV function.  Echocardiogram June 2019 showed normal LV function, mild diastolic dysfunction, mild to moderate mitral regurgitation, moderate biatrial enlargement and mild to moderate tricuspid regurgitation.  Preoperative carotid Dopplers showed 1 to 39% bilateral stenosis.  Patient had coronary artery bypass graft on April 25, 2018 with a LIMA to the LAD, saphenous vein graft to the PDA and saphenous vein graft to the circumflex.  Had postoperative atrial fibrillation treated with amiodarone. CTA July 2019 showed no pulmonary embolus.  There was a 6 mm right major fissure nodule and noncontrast chest CT recommended at 6 to 12 months.  Has subsequently been diagnosed with obstructive sleep apnea.  Since last seen, the patient denies any dyspnea on exertion, orthopnea, PND, pedal edema, palpitations, syncope or chest pain.   Current Outpatient Medications  Medication Sig Dispense Refill  . acetaminophen (TYLENOL) 325 MG tablet Take 325 mg by mouth every 6 (six) hours as needed.    Marland Kitchen aspirin EC 81 MG tablet Take 81 mg by mouth daily.    Marland Kitchen atorvastatin (LIPITOR) 80 MG tablet Take 1 tablet (80 mg total) by mouth daily at 6 PM. 30 tablet 1  . lisinopril (PRINIVIL,ZESTRIL) 2.5 MG tablet Take 1 tablet (2.5 mg total) by mouth daily. 30 tablet 1  . metoprolol tartrate (LOPRESSOR) 25 MG tablet TAKE 0.5 TABLETS (12.5 MG TOTAL) BY MOUTH 2 (TWO) TIMES DAILY. 90 tablet 1   No current facility-administered medications for this visit.      Past Medical History:  Diagnosis Date  . Hyperlipemia     Past Surgical History:  Procedure Laterality Date  . CORONARY ARTERY BYPASS GRAFT N/A 04/23/2018   Procedure: CORONARY ARTERY BYPASS GRAFTING (CABG) x3 using the right greater saphenous vein harvested endoscopically and the  left internal mammary artery. LIMA to LAD, SVG to Distal Circ, SVG to PD;  Surgeon: Delight Ovens, MD;  Location: [facility] OR;  Service: Open Heart Surgery;  Laterality: N/A;  . LEFT HEART CATH AND CORONARY ANGIOGRAPHY N/A 04/21/2018   Procedure: LEFT HEART CATH AND CORONARY ANGIOGRAPHY;  Surgeon: Runell Gess, MD;  Location: [facility];  Service: Cardiovascular;  Laterality: N/A;  . TEE WITHOUT CARDIOVERSION N/A 04/23/2018   Procedure: TRANSESOPHAGEAL ECHOCARDIOGRAM (TEE);  Surgeon: Delight Ovens, MD;  Location: [facility] OR;  Service: Open Heart Surgery;  Laterality: N/A;    Social History   Socioeconomic History  . Marital status: Married    Spouse name: Not on file  . Number of children: Not on file  . Years of education: Not on file  . Highest education level: Not on file  Occupational History  . Not on file  Social Needs  . Financial resource strain: Not on file  . Food insecurity:    Worry: Not on file    Inability: Not on file  . Transportation needs:    Medical: Not on file    Non-medical: Not on file  Tobacco Use  . Smoking status: Former Smoker    Types: Cigarettes    Start date: 10/26/1982    Last attempt to quit: 04/20/2018    Years since quitting: 0.3  . Smokeless tobacco: Never Used  . Tobacco comment: smoked about 20 cigarrets every 2 days.   Substance and Sexual Activity  . Alcohol use: Yes  Comment: socially   . Drug use: Never  . Sexual activity: Yes  Lifestyle  . Physical activity:    Days per week: Not on file    Minutes per session: Not on file  . Stress: Not on file  Relationships  . Social connections:    Talks on phone: Not on file    Gets together: Not on file    Attends religious service: Not on file    Active member of club or organization: Not on file    Attends meetings of clubs or organizations: Not on file    Relationship status: Not on file  . Intimate partner violence:    Fear of current or ex partner: Not on file     Emotionally abused: Not on file    Physically abused: Not on file    Forced sexual activity: Not on file  Other Topics Concern  . Not on file  Social History Narrative  . Not on file    Family History  Problem Relation Age of Onset  . CAD Father   . Stroke Father   . CAD Brother   . Aortic stenosis Brother     ROS: no fevers or chills, productive cough, hemoptysis, dysphasia, odynophagia, melena, hematochezia, dysuria, hematuria, rash, seizure activity, orthopnea, PND, pedal edema, claudication. Remaining systems are negative.  Physical Exam: Well-developed well-nourished in no acute distress.  Skin is warm and dry.  HEENT is normal.  Neck is supple.  Chest is clear to auscultation with normal expansion.  Cardiovascular exam is regular rate and rhythm.  Abdominal exam nontender or distended. No masses palpated. Extremities show no edema. neuro grossly intact  A/P  1 coronary artery disease status post coronary artery bypass graft-patient doing well with no chest pain.  Continue aspirin and statin.  2 pulmonary nodule-follow-up noncontrast chest CT January 2020 (pt states primary care is following this).  3 hypertension-patient's blood pressure is controlled.  Continue present medications.  4 hyperlipidemia-continue statin.  5 Postoperative atrial fibrillation-patient remains in sinus rhythm.  Amiodarone has been discontinued.  6 tobacco abuse-patient has discontinued.  7 obstructive sleep apnea-managed by Dr. Tresa Endo.  Olga Millers, MD

## 2018-08-10 ENCOUNTER — Encounter (HOSPITAL_COMMUNITY): Payer: 59

## 2018-08-10 ENCOUNTER — Encounter (HOSPITAL_COMMUNITY)
Admission: RE | Admit: 2018-08-10 | Discharge: 2018-08-10 | Disposition: A | Discharge status: Home / Self Care | Payer: 59 | Source: Ambulatory Visit | Attending: Cardiology | Admitting: Cardiology

## 2018-08-10 DIAGNOSIS — Z951 Presence of aortocoronary bypass graft: Secondary | ICD-10-CM

## 2018-08-12 ENCOUNTER — Encounter (HOSPITAL_COMMUNITY): Payer: 59

## 2018-08-12 ENCOUNTER — Encounter (HOSPITAL_COMMUNITY)
Admission: RE | Admit: 2018-08-12 | Discharge: 2018-08-12 | Disposition: A | Discharge status: Home / Self Care | Payer: 59 | Source: Ambulatory Visit | Attending: Cardiology | Admitting: Cardiology

## 2018-08-12 DIAGNOSIS — Z951 Presence of aortocoronary bypass graft: Secondary | ICD-10-CM

## 2018-08-15 ENCOUNTER — Encounter (HOSPITAL_COMMUNITY)
Admission: RE | Admit: 2018-08-15 | Discharge: 2018-08-15 | Disposition: A | Discharge status: Home / Self Care | Payer: 59 | Source: Ambulatory Visit | Attending: Cardiology | Admitting: Cardiology

## 2018-08-15 ENCOUNTER — Encounter (HOSPITAL_COMMUNITY): Payer: 59

## 2018-08-15 DIAGNOSIS — Z951 Presence of aortocoronary bypass graft: Secondary | ICD-10-CM

## 2018-08-17 ENCOUNTER — Encounter (HOSPITAL_COMMUNITY)
Admission: RE | Admit: 2018-08-17 | Discharge: 2018-08-17 | Disposition: A | Discharge status: Home / Self Care | Payer: 59 | Source: Ambulatory Visit | Attending: Cardiology | Admitting: Cardiology

## 2018-08-17 ENCOUNTER — Encounter (HOSPITAL_COMMUNITY): Payer: 59

## 2018-08-17 ENCOUNTER — Ambulatory Visit (INDEPENDENT_AMBULATORY_CARE_PROVIDER_SITE_OTHER): Payer: 59 | Admitting: Cardiology

## 2018-08-17 ENCOUNTER — Encounter: Payer: Self-pay | Admitting: Cardiology

## 2018-08-17 VITALS — BP 118/64 | HR 59 | Ht 69.0 in | Wt 204.8 lb

## 2018-08-17 DIAGNOSIS — Z951 Presence of aortocoronary bypass graft: Secondary | ICD-10-CM

## 2018-08-17 DIAGNOSIS — E7800 Pure hypercholesterolemia, unspecified: Secondary | ICD-10-CM

## 2018-08-17 DIAGNOSIS — I10 Essential (primary) hypertension: Secondary | ICD-10-CM | POA: Diagnosis not present

## 2018-08-17 DIAGNOSIS — I2510 Atherosclerotic heart disease of native coronary artery without angina pectoris: Secondary | ICD-10-CM

## 2018-08-17 NOTE — Patient Instructions (Signed)
Your physician recommends that you schedule a follow-up appointment in: 6 MONTHS WITH DR CRENSHAW PLEASE GIVE OUR OFFICE A CALL 2 MONTHS PRIOR TO THAT APPOINTMENT TIME TO SCHEDULE   

## 2018-08-19 ENCOUNTER — Encounter (HOSPITAL_COMMUNITY): Payer: 59

## 2018-08-19 ENCOUNTER — Encounter (HOSPITAL_COMMUNITY)
Admission: RE | Admit: 2018-08-19 | Discharge: 2018-08-19 | Disposition: A | Discharge status: Home / Self Care | Payer: 59 | Source: Ambulatory Visit | Attending: Cardiology | Admitting: Cardiology

## 2018-08-19 DIAGNOSIS — Z951 Presence of aortocoronary bypass graft: Secondary | ICD-10-CM | POA: Diagnosis not present

## 2018-08-22 ENCOUNTER — Encounter (HOSPITAL_COMMUNITY)
Admission: RE | Admit: 2018-08-22 | Discharge: 2018-08-22 | Disposition: A | Discharge status: Home / Self Care | Payer: 59 | Source: Ambulatory Visit | Attending: Cardiology | Admitting: Cardiology

## 2018-08-22 ENCOUNTER — Encounter (HOSPITAL_COMMUNITY): Payer: 59

## 2018-08-22 DIAGNOSIS — Z951 Presence of aortocoronary bypass graft: Secondary | ICD-10-CM | POA: Diagnosis not present

## 2018-08-24 ENCOUNTER — Encounter (HOSPITAL_COMMUNITY)
Admission: RE | Admit: 2018-08-24 | Discharge: 2018-08-24 | Disposition: A | Discharge status: Home / Self Care | Payer: 59 | Source: Ambulatory Visit | Attending: Cardiology | Admitting: Cardiology

## 2018-08-24 ENCOUNTER — Encounter (HOSPITAL_COMMUNITY): Payer: 59

## 2018-08-24 DIAGNOSIS — Z951 Presence of aortocoronary bypass graft: Secondary | ICD-10-CM

## 2018-08-25 NOTE — Progress Notes (Signed)
Cardiac Individual Treatment Plan  Patient Details  Name: Zachary [name] MRN: [id number] Date of Birth: [birth year] Referring Provider:   Flowsheet Row CARDIAC REHAB PHASE II ORIENTATION from 06/23/2018 in [facility]  Referring Provider  Lelon Perla MD       Initial Encounter Date:  [facility] PHASE II ORIENTATION from 06/23/2018 in [facility]  Date  06/23/18      Visit Diagnosis: S/P CABG x 3  Patient's Home Medications on Admission:  Current Outpatient Medications:  .  acetaminophen (TYLENOL) 325 MG tablet, Take 325 mg by mouth every 6 (six) hours as needed., Disp: , Rfl:  .  aspirin EC 81 MG tablet, Take 81 mg by mouth daily., Disp: , Rfl:  .  atorvastatin (LIPITOR) 80 MG tablet, Take 1 tablet (80 mg total) by mouth daily at 6 PM., Disp: 30 tablet, Rfl: 1 .  lisinopril (PRINIVIL,ZESTRIL) 2.5 MG tablet, Take 1 tablet (2.5 mg total) by mouth daily., Disp: 30 tablet, Rfl: 1 .  metoprolol tartrate (LOPRESSOR) 25 MG tablet, TAKE 0.5 TABLETS (12.5 MG TOTAL) BY MOUTH 2 (TWO) TIMES DAILY., Disp: 90 tablet, Rfl: 1  Past Medical History: Past Medical History:  Diagnosis Date  . Hyperlipemia     Tobacco Use: Social History   Tobacco Use  Smoking Status Former Smoker  . Types: Cigarettes  . Start date: 10/26/1982  . Last attempt to quit: 04/20/2018  . Years since quitting: 0.3  Smokeless Tobacco Never Used  Tobacco Comment   smoked about 20 cigarrets every 2 days.     Labs: Recent Review Flowsheet Data    Labs for ITP Cardiac and Pulmonary Rehab Latest Ref Rng & Units 04/23/2018 04/23/2018 04/23/2018 04/24/2018 06/30/2018   Cholestrol <200 mg/dL - - - - 123   LDLCALC mg/dL (calc) - - - - 65   HDL >40 mg/dL - - - - 44   Trlycerides <150 mg/dL - - - - 67   Hemoglobin A1c <5.7 % of total Hgb - - - - 6.0(H)   PHART 7.350 - 7.450 7.343(L) 7.357 - - -   PCO2ART 32.0 - 48.0 mmHg 43.2 43.0 - - -   HCO3  20.0 - 28.0 mmol/L 23.6 24.2 - - -   TCO2 22 - 32 mmol/L _0 -   ACIDBASEDEF 0.0 - 2.0 mmol/L 2.0 1.0 - - -   O2SAT % 97.0 98.0 - - -      Capillary Blood Glucose: Lab Results  Component Value Date   GLUCAP 122 (H) 04/27/2018   GLUCAP 159 (H) 04/26/2018   GLUCAP 125 (H) 04/26/2018   GLUCAP 67 (L) 04/26/2018   GLUCAP 136 (H) 04/26/2018     Exercise Target Goals: Exercise Program Goal: Individual exercise prescription set using results from initial 6 min walk test and THRR while considering  patient's activity barriers and safety.   Exercise Prescription Goal: Initial exercise prescription builds to 30-45 minutes a day of aerobic activity, 2-3 days per week.  Home exercise guidelines will be given to patient during program as part of exercise prescription that the participant will acknowledge.  Activity Barriers & Risk Stratification: Activity Barriers & Cardiac Risk Stratification - 06/23/18 1209    Activity Barriers & Cardiac Risk Stratification          Activity Barriers  None    Cardiac Risk Stratification  High  6 Minute Walk: 6 Minute Walk    6 Minute Walk    Row Name 06/23/18 1208   Phase  Initial   Distance  1652 feet   Walk Time  6 minutes   # of Rest Breaks  0   MPH  3.13   METS  3.85   RPE  10   Perceived Dyspnea   0   VO2 Peak  13.5   Symptoms  No   Resting HR  66 bpm   Resting BP  110/64   Resting Oxygen Saturation   98 %   Exercise Oxygen Saturation  during 6 min walk  99 %   Max Ex. HR  95 bpm   Max Ex. BP  120/70   2 Minute Post BP  128/74          Oxygen Initial Assessment:   Oxygen Re-Evaluation:   Oxygen Discharge (Final Oxygen Re-Evaluation):   Initial Exercise Prescription: Initial Exercise Prescription - 06/23/18 1200    Date of Initial Exercise RX and Referring Provider          Date  06/23/18    Referring Provider  Lelon Perla MD     Expected Discharge Date  09/16/18        Treadmill           MPH  3    Grade  0    Minutes  10    METs  3.3        Bike          Level  1    Minutes  10    METs  3.1        NuStep          Level  2    SPM  80    Minutes  10    METs  2.8        Prescription Details          Frequency (times per week)  3x    Duration  Progress to 30 minutes of continuous aerobic without signs/symptoms of physical distress        Intensity          THRR 40-80% of Max Heartrate  64-129    Ratings of Perceived Exertion  11-13    Perceived Dyspnea  0-4        Progression          Progression  Continue progressive overload as per policy without signs/symptoms or physical distress.        Resistance Training          Training Prescription  Yes    Weight  3lbs    Reps  10-15           Perform Capillary Blood Glucose checks as needed.  Exercise Prescription Changes: Exercise Prescription Changes    Response to Exercise    Row Name 06/29/18 1000 07/11/18 1608 07/27/18 1233 08/03/18 1537 08/24/18 1432   Blood Pressure (Admit)  114/64  124/60  119/72  118/62  116/72   Blood Pressure (Exercise)  138/78  128/60  112/72  142/58  150/72   Blood Pressure (Exit)  114/70  108/60  122/70  102/60  104/68   Heart Rate (Admit)  70 bpm  68 bpm  63 bpm  65 bpm  64 bpm   Heart Rate (Exercise)  97 bpm  98 bpm  120 bpm  118 bpm  122 bpm   Heart Rate (  Exit)  70 bpm  78 bpm  73 bpm  75 bpm  74 bpm   Rating of Perceived Exertion (Exercise)  _0 Perceived Dyspnea (Exercise)  0  0  0  0  0   Symptoms  None   None   None   None   None    Comments  Pt oriented to exercise equipment   no documentation  Added Elliptical to Exercise Prescription  Pt has started jogging in rehab  no documentation   Duration  Continue with 45 min of aerobic exercise without signs/symptoms of physical distress.  Continue with 45 min of aerobic exercise without signs/symptoms of physical distress.  Continue with 45 min of aerobic exercise without signs/symptoms of  physical distress.  Continue with 45 min of aerobic exercise without signs/symptoms of physical distress.  Continue with 45 min of aerobic exercise without signs/symptoms of physical distress.   Intensity  THRR unchanged  THRR unchanged  THRR unchanged  THRR unchanged  THRR unchanged       Progression    Row Name 06/29/18 1000 07/11/18 1608 07/27/18 1233 08/03/18 1537 08/24/18 1432   Progression  Continue to progress workloads to maintain intensity without signs/symptoms of physical distress.  Continue to progress workloads to maintain intensity without signs/symptoms of physical distress.  Continue to progress workloads to maintain intensity without signs/symptoms of physical distress.  Continue to progress workloads to maintain intensity without signs/symptoms of physical distress.  Continue to progress workloads to maintain intensity without signs/symptoms of physical distress.   Average METs  2.95  4.1  6.91  7.91  7.72       Resistance Training    Row Name 06/29/18 1000 07/11/18 1608 07/27/18 1233 08/03/18 1537 08/24/18 1432   Training Prescription  No  Yes  No  No  No   Weight  no documentation  5lbs  no documentation  no documentation  no documentation   Reps  no documentation  10-15  no documentation  no documentation  no documentation   Time  no documentation  10 Minutes  no documentation  no documentation  no documentation       Interval Training    Row Name 06/29/18 1000 07/11/18 1608 07/27/18 1233 08/03/18 1537 08/24/18 1432   Interval Training  No  No  No  Yes  Yes   Equipment  no documentation  no documentation  no documentation  Treadmill  Treadmill   Comments  no documentation  no documentation  no documentation  Pt is jogging for 4 min. and walking 6 min.   Pt is jogging for 4 min. and walking 6 min.        Treadmill    Row Name 06/29/18 1000 07/11/18 1608 07/27/18 1233 08/03/18 1537 08/24/18 1432   MPH  3  3  3.7  6.5 walking: 3.7/3.5  6.5 walking: 3.7/3.5   Grade  0  2   3.5  0  0   Minutes  _1 Walking: 6 min Jogging: 4 min.   10 Walking: 6 min Jogging: 4 min.    METs  3.3  4.12  5.62  7.75  7.75       Bike    Row Name 06/29/18 1000 07/11/18 1608 07/27/18 1233 08/03/18 1537 08/24/18 1432   Level  1  2  no documentation  no documentation  no documentation   Minutes  10  10  no documentation  no documentation  no documentation   METs  3.07  5.1  no documentation  no documentation  no documentation       NuStep    Row Name 06/29/18 1000 07/11/18 1608 07/27/18 1233 08/03/18 1537 08/24/18 1432   Level  _0 SPM  85  95  115  115  115   Minutes  _1 METs  2.5  3.2  8.2  7.5  8.2       Elliptical    Row Name 06/29/18 1000 07/11/18 1608 07/27/18 1233 08/03/18 1537 08/24/18 1432   Level  no documentation  no documentation  _2 Speed  no documentation  no documentation  _3 Minutes  no documentation  no documentation  _4 METs  no documentation  no documentation  8.2  8.5  7.2       Home Exercise Plan    Row Name 06/29/18 1000 07/11/18 1608 07/27/18 1233 08/03/18 1537 08/24/18 1432   Plans to continue exercise at  no documentation  [facility] ([facility]) [facility], Walking  Community [facility] (comment) [facility], Walking  Community Facility (comment) [facility], Walking  Community [facility] (comment) [facility], Walking   Frequency  no documentation  Add 2 additional days to program exercise sessions.  Add 2 additional days to program exercise sessions.  Add 2 additional days to program exercise sessions.  Add 2 additional days to program exercise sessions.   Initial Home Exercises Provided  no documentation  07/11/18  07/11/18  07/11/18  07/11/18          Exercise Comments: Exercise Comments    Row Name 06/29/18 1003 07/11/18 1518 08/10/18 1639 08/24/18 1436   Exercise Comments  Pt oriented to exericse equipment. Pt's first day of exericse. Pt responded well to exercise and is off to good  start. Will continue to monitor.   Reviewed HEP with pt. Pt verbalized understanding the importance of adding exercise to weekly routine in addition to his golf routine. Pt interested in trying elliptical in rehab. Will work with pt to start new machine this week.    Reviewed METs and goals with Pt.   Reviewed METs and goals with Pt. Pt increases MET levels every session. Has added jogging and interval training on the treamill.       Exercise Goals and Review: Exercise Goals    Exercise Goals    Row Name 06/23/18 1209   Increase Physical Activity  Yes   Intervention  Provide advice, education, support and counseling about physical activity/exercise needs.;Develop an individualized exercise prescription for aerobic and resistive training based on initial evaluation findings, risk stratification, comorbidities and participant's personal goals.   Expected Outcomes  Short Term: Attend rehab on a regular basis to increase amount of physical activity.;Long Term: Add in home exercise to make exercise part of routine and to increase amount of physical activity.;Long Term: Exercising regularly at least 3-5 days a week.   Increase Strength and Stamina  Yes   Intervention  Provide advice, education, support and counseling about physical activity/exercise needs.;Develop an individualized exercise prescription for aerobic and resistive training based on initial evaluation findings, risk stratification, comorbidities and participant's personal goals.   Expected Outcomes  Short Term: Increase workloads from initial exercise prescription  for resistance, speed, and METs.;Long Term: Improve cardiorespiratory fitness, muscular endurance and strength as measured by increased METs and functional capacity (6MWT);Short Term: Perform resistance training exercises routinely during rehab and add in resistance training at home   Able to understand and use rate of perceived exertion (RPE) scale  Yes   Intervention  Provide  education and explanation on how to use RPE scale   Expected Outcomes  Short Term: Able to use RPE daily in rehab to express subjective intensity level;Long Term:  Able to use RPE to guide intensity level when exercising independently   Knowledge and understanding of Target Heart Rate Range (THRR)  Yes   Intervention  Provide education and explanation of THRR including how the numbers were predicted and where they are located for reference   Expected Outcomes  Short Term: Able to state/look up THRR;Long Term: Able to use THRR to govern intensity when exercising independently;Short Term: Able to use daily as guideline for intensity in rehab   Able to check pulse independently  Yes   Intervention  Provide education and demonstration on how to check pulse in carotid and radial arteries.;Review the importance of being able to check your own pulse for safety during independent exercise   Expected Outcomes  Short Term: Able to explain why pulse checking is important during independent exercise;Long Term: Able to check pulse independently and accurately   Intervention  Provide education, explanation, and written materials on patient's individual exercise prescription   Expected Outcomes  Short Term: Able to explain program exercise prescription;Long Term: Able to explain home exercise prescription to exercise independently          Exercise Goals Re-Evaluation : Exercise Goals Re-Evaluation    Exercise Goal Re-Evaluation    Row Name 07/11/18 1515 08/10/18 1638 08/24/18 1434   Exercise Goals Review  Increase Physical Activity;Able to understand and use rate of perceived exertion (RPE) scale;Knowledge and understanding of Target Heart Rate Range (THRR);Understanding of Exercise Prescription;Increase Strength and Stamina;Able to check pulse independently  Increase Physical Activity;Able to understand and use rate of perceived exertion (RPE) scale;Understanding of Exercise Prescription;Knowledge and  understanding of Target Heart Rate Range (THRR);Increase Strength and Stamina;Able to check pulse independently  Increase Physical Activity;Increase Strength and Stamina;Able to check pulse independently;Understanding of Exercise Prescription;Knowledge and understanding of Target Heart Rate Range (THRR);Able to understand and use rate of perceived exertion (RPE) scale   Comments  Reviewed HEP with pt. Also reviewed THRR, RPE Scale, weather precautions, endpoints of exericse, NTG use, warmup and cool down. Explained to pt the difference between physical activity and exercise. Pt golfs alot, but does not exercise.  Patient will continue walking and biking in addition to CR exercise.   Reviewed METs and goals with Pt. Pt continues to progress workloads. Pt is tolerating exercise well.    Expected Outcomes  Pt will plan to walk for exercise 2-3 days a week, 30-45 minutes. Pt plans to start going to gym near home. Will continue to follow up with pt.   Will continue to monitor and progress Pt as tolerated.   Will continue to monitor and progress Pt as tolerated.           Discharge Exercise Prescription (Final Exercise Prescription Changes): Exercise Prescription Changes - 08/24/18 1432    Response to Exercise          Blood Pressure (Admit)  116/72    Blood Pressure (Exercise)  150/72    Blood Pressure (Exit)  104/68  Heart Rate (Admit)  64 bpm    Heart Rate (Exercise)  122 bpm    Heart Rate (Exit)  74 bpm    Rating of Perceived Exertion (Exercise)  13    Perceived Dyspnea (Exercise)  0    Symptoms  None     Duration  Continue with 45 min of aerobic exercise without signs/symptoms of physical distress.    Intensity  THRR unchanged        Progression          Progression  Continue to progress workloads to maintain intensity without signs/symptoms of physical distress.    Average METs  7.72        Resistance Training          Training Prescription  No        Interval Training           Interval Training  Yes    Equipment  Treadmill    Comments  Pt is jogging for 4 min. and walking 6 min.         Treadmill          MPH  6.5   walking: 3.7/3.5   Grade  0    Minutes  10   Walking: 6 min Jogging: 4 min.    METs  7.75        NuStep          Level  6    SPM  115    Minutes  10    METs  8.2        Elliptical          Level  4    Speed  2    Minutes  10    METs  7.2        Home Exercise Plan          Plans to continue exercise at  [facility] (comment)   [facility], Walking   Frequency  Add 2 additional days to program exercise sessions.    Initial Home Exercises Provided  07/11/18           Nutrition:  Target Goals: Understanding of nutrition guidelines, daily intake of sodium <1541m, cholesterol <2082m calories 30% from fat and 7% or less from saturated fats, daily to have 5 or more servings of fruits and vegetables.  Biometrics: Pre Biometrics - 06/23/18 1210    Pre Biometrics          Height  5' 8.5" (1.74 m)    Weight  91.3 kg    Waist Circumference  39 inches    Hip Circumference  41 inches    Waist to Hip Ratio  0.95 %    BMI (Calculated)  30.16    Triceps Skinfold  12 mm    % Body Fat  26.7 %    Grip Strength  40 kg    Flexibility  15 in    Single Leg Stand  30 seconds            Nutrition Therapy Plan and Nutrition Goals: Nutrition Therapy & Goals - 07/29/18 0805    Nutrition Therapy          Diet  heart healthy carb modified        Personal Nutrition Goals          Nutrition Goal  Pt to identify and limit food sources of saturated fat, trans fat, refined carbohydrates and sodium    Personal Goal #2  Pt to identify food quantities necessary to achieve weight loss of 6-24 lbs. at graduation from cardiac rehab. Goal wt of 185 lb desired.     Personal Goal #3  Pt able to name foods that affect blood glucose.        Intervention Plan          Intervention  Prescribe, educate and counsel regarding  individualized specific dietary modifications aiming towards targeted core components such as weight, hypertension, lipid management, diabetes, heart failure and other comorbidities.    Expected Outcomes  Short Term Goal: Understand basic principles of dietary content, such as calories, fat, sodium, cholesterol and nutrients.;Long Term Goal: Adherence to prescribed nutrition plan.           Nutrition Assessments: Nutrition Assessments - 07/29/18 0806    MEDFICTS Scores          Pre Score  25           Nutrition Goals Re-Evaluation:   Nutrition Goals Re-Evaluation:   Nutrition Goals Discharge (Final Nutrition Goals Re-Evaluation):   Psychosocial: Target Goals: Acknowledge presence or absence of significant depression and/or stress, maximize coping skills, provide positive support system. Participant is able to verbalize types and ability to use techniques and skills needed for reducing stress and depression.  Initial Review & Psychosocial Screening: Initial Psych Review & Screening - 06/23/18 1053    Initial Review          Current issues with  None Identified        Family Dynamics          Good Support System?  Yes   Pt lists his wife, children, grandparents, and golf buddies as sources of support.        Barriers          Psychosocial barriers to participate in program  There are no identifiable barriers or psychosocial needs.        Screening Interventions          Interventions  Encouraged to exercise           Quality of Life Scores: Quality of Life - 06/23/18 1120    Quality of Life          Select  Quality of Life        Quality of Life Scores          Health/Function Pre  22.2 %    Socioeconomic Pre  22.5 %    Psych/Spiritual Pre  22.71 %    Family Pre  24.5 %    GLOBAL Pre  22.7 %          Scores of 19 and below usually indicate a poorer quality of life in these areas.  A difference of  2-3 points is a clinically meaningful difference.   A difference of 2-3 points in the total score of the Quality of Life Index has been associated with significant improvement in overall quality of life, self-image, physical symptoms, and general health in studies assessing change in quality of life.  PHQ-9: Recent Review Flowsheet Data    Depression screen [facility] 2/9 05/10/2018   Decreased Interest 0   Down, Depressed, Hopeless 0   PHQ - 2 Score 0     Interpretation of Total Score  Total Score Depression Severity:  1-4 = Minimal depression, 5-9 = Mild depression, 10-14 = Moderate depression, 15-19 = Moderately severe depression, 20-27 = Severe depression   Psychosocial Evaluation and Intervention: Psychosocial Evaluation - 06/29/18 1128  Psychosocial Evaluation & Interventions          Interventions  Encouraged to exercise with the program and follow exercise prescription    Comments  no psychosocial needs identified, no interventions necessary. pt enjoys playing golf, has been putting, chipping and 3/4 swings following Dr. Everrett Coombe recommendation.     Expected Outcomes  pt will exhibit positive outlook with good coping skills.     Continue Psychosocial Services   No Follow up required           Psychosocial Re-Evaluation: Psychosocial Re-Evaluation    Psychosocial Re-Evaluation    Row Name 07/20/18 1116 08/17/18 1118 08/22/18 0734   Current issues with  None Identified  None Identified  None Identified   Comments  no psychosocial needs identified, no interventions necessary   no psychosocial needs identified, no interventions necessary   no psychosocial needs identified, no interventions necessary    Expected Outcomes  pt will exhibit positive outlook with good coping skills.   pt will exhibit positive outlook with good coping skills.   pt will exhibit positive outlook with good coping skills.    Interventions  Encouraged to attend Cardiac Rehabilitation for the exercise  Encouraged to attend Cardiac Rehabilitation for the  exercise  Encouraged to attend Cardiac Rehabilitation for the exercise   Continue Psychosocial Services   No Follow up required  No Follow up required  No Follow up required          Psychosocial Discharge (Final Psychosocial Re-Evaluation): Psychosocial Re-Evaluation - 08/22/18 0734    Psychosocial Re-Evaluation          Current issues with  None Identified    Comments  no psychosocial needs identified, no interventions necessary     Expected Outcomes  pt will exhibit positive outlook with good coping skills.     Interventions  Encouraged to attend Cardiac Rehabilitation for the exercise    Continue Psychosocial Services   No Follow up required           Vocational Rehabilitation: Provide vocational rehab assistance to qualifying candidates.   Vocational Rehab Evaluation & Intervention: Vocational Rehab - 06/23/18 1121    Initial Vocational Rehab Evaluation & Intervention          Assessment shows need for Vocational Rehabilitation  No           Education: Education Goals: Education classes will be provided on a weekly basis, covering required topics. Participant will state understanding/return demonstration of topics presented.  Learning Barriers/Preferences: Learning Barriers/Preferences - 06/23/18 1245    Learning Barriers/Preferences          Learning Barriers  Sight    Learning Preferences  Skilled Demonstration           Education Topics: Count Your Pulse:  -Group instruction provided by verbal instruction, demonstration, patient participation and written materials to support subject.  Instructors address importance of being able to find your pulse and how to count your pulse when at home without a heart monitor.  Patients get hands on experience counting their pulse with staff help and individually.   Heart Attack, Angina, and Risk Factor Modification:  -Group instruction provided by verbal instruction, video, and written materials to support subject.   Instructors address signs and symptoms of angina and heart attacks.    Also discuss risk factors for heart disease and how to make changes to improve heart health risk factors.   Functional Fitness:  -Group instruction provided by verbal instruction, demonstration, patient participation,  and written materials to support subject.  Instructors address safety measures for doing things around the house.  Discuss how to get up and down off the floor, how to pick things up properly, how to safely get out of a chair without assistance, and balance training.   Meditation and Mindfulness:  -Group instruction provided by verbal instruction, patient participation, and written materials to support subject.  Instructor addresses importance of mindfulness and meditation practice to help reduce stress and improve awareness.  Instructor also leads participants through a meditation exercise.    Stretching for Flexibility and Mobility:  -Group instruction provided by verbal instruction, patient participation, and written materials to support subject.  Instructors lead participants through series of stretches that are designed to increase flexibility thus improving mobility.  These stretches are additional exercise for major muscle groups that are typically performed during regular warm up and cool down.   Hands Only CPR:  -Group verbal, video, and participation provides a basic overview of AHA guidelines for community CPR. Role-play of emergencies allow participants the opportunity to practice calling for help and chest compression technique with discussion of AED use.   Hypertension: -Group verbal and written instruction that provides a basic overview of hypertension including the most recent diagnostic guidelines, risk factor reduction with self-care instructions and medication management.    Nutrition I class: Heart Healthy Eating:  -Group instruction provided by PowerPoint slides, verbal discussion, and  written materials to support subject matter. The instructor gives an explanation and review of the Therapeutic Lifestyle Changes diet recommendations, which includes a discussion on lipid goals, dietary fat, sodium, fiber, plant stanol/sterol esters, sugar, and the components of a well-balanced, healthy diet.   Nutrition II class: Lifestyle Skills:  -Group instruction provided by PowerPoint slides, verbal discussion, and written materials to support subject matter. The instructor gives an explanation and review of label reading, grocery shopping for heart health, heart healthy recipe modifications, and ways to make healthier choices when eating out.   Diabetes Question & Answer:  -Group instruction provided by PowerPoint slides, verbal discussion, and written materials to support subject matter. The instructor gives an explanation and review of diabetes co-morbidities, pre- and post-prandial blood glucose goals, pre-exercise blood glucose goals, signs, symptoms, and treatment of hypoglycemia and hyperglycemia, and foot care basics.   Diabetes Blitz:  -Group instruction provided by PowerPoint slides, verbal discussion, and written materials to support subject matter. The instructor gives an explanation and review of the physiology behind type 1 and type 2 diabetes, diabetes medications and rational behind using different medications, pre- and post-prandial blood glucose recommendations and Hemoglobin A1c goals, diabetes diet, and exercise including blood glucose guidelines for exercising safely.    Portion Distortion:  -Group instruction provided by PowerPoint slides, verbal discussion, written materials, and food models to support subject matter. The instructor gives an explanation of serving size versus portion size, changes in portions sizes over the last 20 years, and what consists of a serving from each food group.   Stress Management:  -Group instruction provided by verbal instruction,  video, and written materials to support subject matter.  Instructors review role of stress in heart disease and how to cope with stress positively.     Exercising on Your Own:  -Group instruction provided by verbal instruction, power point, and written materials to support subject.  Instructors discuss benefits of exercise, components of exercise, frequency and intensity of exercise, and end points for exercise.  Also discuss use of nitroglycerin and activating EMS.  Review options of places to exercise outside of rehab.  Review guidelines for sex with heart disease.   Cardiac Drugs I:  -Group instruction provided by verbal instruction and written materials to support subject.  Instructor reviews cardiac drug classes: antiplatelets, anticoagulants, beta blockers, and statins.  Instructor discusses reasons, side effects, and lifestyle considerations for each drug class. Flowsheet Row CARDIAC REHAB PHASE II EXERCISE from 07/06/2018 in [place]  Date  07/06/18  Instruction Review Code  2- Demonstrated Understanding      Cardiac Drugs II:  -Group instruction provided by verbal instruction and written materials to support subject.  Instructor reviews cardiac drug classes: angiotensin converting enzyme inhibitors (ACE-I), angiotensin II receptor blockers (ARBs), nitrates, and calcium channel blockers.  Instructor discusses reasons, side effects, and lifestyle considerations for each drug class.   Anatomy and Physiology of the Circulatory System:  Group verbal and written instruction and models provide basic cardiac anatomy and physiology, with the coronary electrical and arterial systems. Review of: AMI, Angina, Valve disease, Heart Failure, Peripheral Artery Disease, Cardiac Arrhythmia, Pacemakers, and the ICD.   Other Education:  -Group or individual verbal, written, or video instructions that support the educational goals of the cardiac rehab  program.   Holiday Eating Survival Tips:  -Group instruction provided by PowerPoint slides, verbal discussion, and written materials to support subject matter. The instructor gives patients tips, tricks, and techniques to help them not only survive but enjoy the holidays despite the onslaught of food that accompanies the holidays.   Knowledge Questionnaire Score: Knowledge Questionnaire Score - 06/23/18 1120    Knowledge Questionnaire Score          Pre Score  22/24           Core Components/Risk Factors/Patient Goals at Admission: Personal Goals and Risk Factors at Admission - 06/23/18 1332    Core Components/Risk Factors/Patient Goals on Admission          Intervention  Weight Management: Develop a combined nutrition and exercise program designed to reach desired caloric intake, while maintaining appropriate intake of nutrient and fiber, sodium and fats, and appropriate energy expenditure required for the weight goal.;Weight Management/Obesity: Establish reasonable short term and long term weight goals.;Weight Management: Provide education and appropriate resources to help participant work on and attain dietary goals.;Obesity: Provide education and appropriate resources to help participant work on and attain dietary goals.    Admit Weight  201 lb 4.5 oz (91.3 kg)    Goal Weight: Short Term  190 lb (86.2 kg)    Goal Weight: Long Term  185 lb (83.9 kg)    Expected Outcomes  Long Term: Adherence to nutrition and physical activity/exercise program aimed toward attainment of established weight goal;Short Term: Continue to assess and modify interventions until short term weight is achieved;Weight Loss: Understanding of general recommendations for a balanced deficit meal plan, which promotes 1-2 lb weight loss per week and includes a negative energy balance of [id number] kcal/d;Understanding recommendations for meals to include 15-35% energy as protein, 25-35% energy from fat, 35-60% energy from  carbohydrates, less than 282m of dietary cholesterol, 20-35 gm of total fiber daily;Understanding of distribution of calorie intake throughout the day with the consumption of 4-5 meals/snacks;Weight Gain: Understanding of general recommendations for a high calorie, high protein meal plan that promotes weight gain by distributing calorie intake throughout the day with the consumption for 4-5 meals, snacks, and/or supplements    Hypertension  Yes    Intervention  Provide education on lifestyle modifcations including regular physical activity/exercise, weight management, moderate sodium restriction and increased consumption of fresh fruit, vegetables, and low fat dairy, alcohol moderation, and smoking cessation.;Monitor prescription use compliance.    Expected Outcomes  Short Term: Continued assessment and intervention until BP is < 140/33m HG in hypertensive participants. < 130/88mHG in hypertensive participants with diabetes, heart failure or chronic kidney disease.;Long Term: Maintenance of blood pressure at goal levels.    Lipids  Yes    Intervention  Provide education and support for participant on nutrition & aerobic/resistive exercise along with prescribed medications to achieve LDL <7041mHDL >80m66m  Expected Outcomes  Short Term: Participant states understanding of desired cholesterol values and is compliant with medications prescribed. Participant is following exercise prescription and nutrition guidelines.;Long Term: Cholesterol controlled with medications as prescribed, with individualized exercise RX and with personalized nutrition plan. Value goals: LDL < 70mg51mL > 40 mg.           Core Components/Risk Factors/Patient Goals Review:  Goals and Risk Factor Review    Core Components/Risk Factors/Patient Goals Review    Row Name 06/29/18 1129 07/20/18 1117 08/17/18 1118 08/22/18 0734   Personal Goals Review  Weight Management/Obesity;Hypertension;Lipids  Weight  Management/Obesity;Hypertension;Lipids  Weight Management/Obesity;Hypertension;Lipids  Weight Management/Obesity;Hypertension;Lipids   Review  pt with significant CAD RF demonstrates eagerness to participate in CR program. pt personal goal is to lose weight and complete CR program to gain insight and guidance to make healthy changes.    pt with significant CAD RF demonstrates eagerness to participate in CR program. pt personal goal is to lose weight and complete CR program to gain insight and guidance to make healthy changes.  pt demonstrates significant CR workload progression  and enjoys using elliptical.   pt with significant CAD RF demonstrates eagerness to participate in CR program. pt personal goal is to lose weight and complete CR program to gain insight and guidance to make healthy changes.  pt demonstrates significant CR workload progression  and enjoys using elliptical.   pt with significant CAD RF demonstrates eagerness to participate in CR program. pt personal goal is to lose weight and complete CR program to gain insight and guidance to make healthy changes.  pt demonstrates significant CR workload progression  and enjoys using elliptical. pt verbalizes overall increased strength/stamina, especially on Nustep.    Expected Outcomes  pt will participate in CR exercise, nutrition and lifestyle modification to decrease overall CAD RF.    pt will participate in CR exercise, nutrition and lifestyle modification to decrease overall CAD RF.    pt will participate in CR exercise, nutrition and lifestyle modification to decrease overall CAD RF.    pt will participate in CR exercise, nutrition and lifestyle modification to decrease overall CAD RF.            Core Components/Risk Factors/Patient Goals at Discharge (Final Review):  Goals and Risk Factor Review - 08/22/18 0734    Core Components/Risk Factors/Patient Goals Review          Personal Goals Review  Weight Management/Obesity;Hypertension;Lipids     Review  pt with significant CAD RF demonstrates eagerness to participate in CR program. pt personal goal is to lose weight and complete CR program to gain insight and guidance to make healthy changes.  pt demonstrates significant CR workload progression  and enjoys using elliptical. pt verbalizes overall increased strength/stamina, especially on Nustep.     Expected Outcomes  pt will participate in  CR exercise, nutrition and lifestyle modification to decrease overall CAD RF.             ITP Comments: ITP Comments    Row Name 06/23/18 1052 06/29/18 1127 07/19/18 1533 08/17/18 1118 08/22/18 0733   ITP Comments  Dr. Fransico Him, Medical Director  pt started group exercise session. pt tolerated light activity without difficulty. pt oriented to exercise equipment and safety routine.   30 day ITP review.  pt with good attendance and participation.   30 day ITP review.  pt with good attendance and participation.   30 day ITP review.  pt with good attendance and participation.       Comments:

## 2018-08-26 ENCOUNTER — Encounter (HOSPITAL_COMMUNITY): Payer: 59

## 2018-08-26 ENCOUNTER — Encounter (HOSPITAL_COMMUNITY)
Admission: RE | Admit: 2018-08-26 | Discharge: 2018-08-26 | Disposition: A | Discharge status: Home / Self Care | Payer: 59 | Source: Ambulatory Visit | Attending: Cardiology | Admitting: Cardiology

## 2018-08-26 DIAGNOSIS — Z951 Presence of aortocoronary bypass graft: Secondary | ICD-10-CM | POA: Diagnosis not present

## 2018-08-26 DIAGNOSIS — E785 Hyperlipidemia, unspecified: Secondary | ICD-10-CM | POA: Insufficient documentation

## 2018-08-26 DIAGNOSIS — Z87891 Personal history of nicotine dependence: Secondary | ICD-10-CM | POA: Diagnosis not present

## 2018-08-26 DIAGNOSIS — Z79891 Long term (current) use of opiate analgesic: Secondary | ICD-10-CM | POA: Insufficient documentation

## 2018-08-26 DIAGNOSIS — Z79899 Other long term (current) drug therapy: Secondary | ICD-10-CM | POA: Insufficient documentation

## 2018-08-26 DIAGNOSIS — Z7982 Long term (current) use of aspirin: Secondary | ICD-10-CM | POA: Insufficient documentation

## 2018-08-29 ENCOUNTER — Encounter (HOSPITAL_COMMUNITY): Payer: 59

## 2018-08-29 ENCOUNTER — Encounter (HOSPITAL_COMMUNITY)
Admission: RE | Admit: 2018-08-29 | Discharge: 2018-08-29 | Disposition: A | Discharge status: Home / Self Care | Payer: 59 | Source: Ambulatory Visit | Attending: Cardiology | Admitting: Cardiology

## 2018-08-29 ENCOUNTER — Ambulatory Visit (HOSPITAL_BASED_OUTPATIENT_CLINIC_OR_DEPARTMENT_OTHER): Payer: 59 | Attending: Cardiovascular Disease | Admitting: Cardiovascular Disease

## 2018-08-29 VITALS — Ht 69.0 in | Wt 195.0 lb

## 2018-08-29 DIAGNOSIS — G4733 Obstructive sleep apnea (adult) (pediatric): Secondary | ICD-10-CM | POA: Insufficient documentation

## 2018-08-29 DIAGNOSIS — Z951 Presence of aortocoronary bypass graft: Secondary | ICD-10-CM

## 2018-08-31 ENCOUNTER — Encounter (HOSPITAL_COMMUNITY): Payer: 59

## 2018-08-31 ENCOUNTER — Encounter (HOSPITAL_COMMUNITY)
Admission: RE | Admit: 2018-08-31 | Discharge: 2018-08-31 | Disposition: A | Discharge status: Home / Self Care | Payer: 59 | Source: Ambulatory Visit | Attending: Cardiology | Admitting: Cardiology

## 2018-08-31 DIAGNOSIS — Z951 Presence of aortocoronary bypass graft: Secondary | ICD-10-CM

## 2018-09-02 ENCOUNTER — Encounter (HOSPITAL_COMMUNITY): Payer: 59

## 2018-09-05 ENCOUNTER — Encounter (HOSPITAL_COMMUNITY)
Admission: RE | Admit: 2018-09-05 | Discharge: 2018-09-05 | Disposition: A | Discharge status: Home / Self Care | Payer: 59 | Source: Ambulatory Visit | Attending: Cardiology | Admitting: Cardiology

## 2018-09-05 ENCOUNTER — Encounter (HOSPITAL_COMMUNITY): Payer: 59

## 2018-09-05 DIAGNOSIS — Z951 Presence of aortocoronary bypass graft: Secondary | ICD-10-CM

## 2018-09-07 ENCOUNTER — Encounter (HOSPITAL_COMMUNITY)
Admission: RE | Admit: 2018-09-07 | Discharge: 2018-09-07 | Disposition: A | Discharge status: Home / Self Care | Payer: 59 | Source: Ambulatory Visit | Attending: Cardiology | Admitting: Cardiology

## 2018-09-07 ENCOUNTER — Encounter (HOSPITAL_COMMUNITY): Payer: 59

## 2018-09-07 DIAGNOSIS — Z951 Presence of aortocoronary bypass graft: Secondary | ICD-10-CM | POA: Diagnosis not present

## 2018-09-09 ENCOUNTER — Encounter (HOSPITAL_COMMUNITY)
Admission: RE | Admit: 2018-09-09 | Discharge: 2018-09-09 | Disposition: A | Discharge status: Home / Self Care | Payer: 59 | Source: Ambulatory Visit | Attending: Cardiology | Admitting: Cardiology

## 2018-09-09 ENCOUNTER — Encounter (HOSPITAL_COMMUNITY): Payer: 59

## 2018-09-09 ENCOUNTER — Encounter (HOSPITAL_BASED_OUTPATIENT_CLINIC_OR_DEPARTMENT_OTHER): Payer: Self-pay | Admitting: Cardiovascular Disease

## 2018-09-09 VITALS — Ht 68.5 in | Wt 203.3 lb

## 2018-09-09 DIAGNOSIS — Z951 Presence of aortocoronary bypass graft: Secondary | ICD-10-CM

## 2018-09-09 DIAGNOSIS — E785 Hyperlipidemia, unspecified: Secondary | ICD-10-CM | POA: Diagnosis not present

## 2018-09-09 DIAGNOSIS — Z7982 Long term (current) use of aspirin: Secondary | ICD-10-CM | POA: Diagnosis not present

## 2018-09-09 NOTE — Procedures (Signed)
Patient Name: Zachary [name], Zachary [name] Date: 08/29/2018 Gender: Male D.O.B: Apr 12, 1961 Age (years): 46 Referring Provider: Nicki Guadalajara MD, ABSM Height (inches): 69 Interpreting Physician: Nicki Guadalajara MD, ABSM Weight (lbs): 195 RPSGT: Shelah Lewandowsky BMI: 29 MRN: [id number] Neck Size: 16.00  CLINICAL INFORMATION The patient is referred for a CPAP titration to treat sleep apnea  Date of HST:05/28/2018:  AHI 22.3/h; RDI 22.3/h; supine sleep AHI 78.6/h; O2 nadir 83%.  SLEEP STUDY TECHNIQUE As per the AASM Manual for the Scoring of Sleep and Associated Events v2.3 (April 2016) with a hypopnea requiring 4% desaturations.  The channels recorded and monitored were frontal, central and occipital EEG, electrooculogram (EOG), submentalis EMG (chin), nasal and oral airflow, thoracic and abdominal wall motion, anterior tibialis EMG, snore microphone, electrocardiogram, and pulse oximetry. Continuous positive airway pressure (CPAP) was initiated at the beginning of the study and titrated to treat sleep-disordered breathing.  MEDICATIONS     acetaminophen (TYLENOL) 325 MG tablet         aspirin EC 81 MG tablet         atorvastatin (LIPITOR) 80 MG tablet         lisinopril (PRINIVIL,ZESTRIL) 2.5 MG tablet         metoprolol tartrate (LOPRESSOR) 25 MG tablet      Medications self-administered by patient taken the night of the study : N/A  TECHNICIAN COMMENTS Comments added by technician: Patient was restless all through the night. Comments added by scorer: N/A  RESPIRATORY PARAMETERS Optimal PAP Pressure (cm): 15 AHI at Optimal Pressure (/hr): 0.0 Overall Minimal O2 (%): 80.0 Supine % at Optimal Pressure (%): 0 Minimal O2 at Optimal Pressure (%): 92.0   SLEEP ARCHITECTURE The study was initiated at 10:56:57 PM and ended at 5:17:45 AM.  Sleep onset time was 14.6 minutes and the sleep efficiency was 67.0%%. The total sleep time was 255 minutes.  The patient spent 23.1%% of the night  in stage N1 sleep, 60.4%% in stage N2 sleep, 0.0%% in stage N3 and 16.5% in REM.Stage REM latency was 231.5 minutes  Wake after sleep onset was 111.2. Alpha intrusion was absent. Supine sleep was 34.16%.  CARDIAC DATA The 2 lead EKG demonstrated sinus rhythm. The mean heart rate was 67.4 beats per minute. Other EKG findings include: PVCs.  LEG MOVEMENT DATA The total Periodic Limb Movements of Sleep (PLMS) were 0. The PLMS index was 0.0. A PLMS index of <15 is considered normal in adults.  IMPRESSIONS - CPAP was initiated at 6 cm and was titrated to optimal CPAP pressure at 15 cm of water; AHI 0; O2 nadir 92%. - Moderate Central Sleep Apnea was noted during this titration (CAI = 21.2/h). Central events were maximal at 10 - 12 cm and improved at higher pressure. - Severe oxygen desaturations were observed during this titration (min O2 = 80.0%). - No snoring was audible during this study. - 2-lead EKG demonstrated: PVCs - Clinically significant periodic limb movements were not noted during this study. Arousals associated with PLMs were rare.  DIAGNOSIS - Obstructive Sleep Apnea (327.23 [G47.33 ICD-10])  RECOMMENDATIONS - Recommend an initial trial of CPAP therapy at 15 cm H2O with heated humidification. A Medium Wide size Philips Respironics Full Face Mask Dreamwear mask was used for the titration.  If patient continues to experience central events, BiPAP titration may be necessary. - Effort should be made to optimize nasal and oropharyngeal patency. - Avoid alcohol, sedatives and other CNS depressants that may worsen sleep apnea  and disrupt normal sleep architecture. - Sleep hygiene should be reviewed to assess factors that may improve sleep quality. - Weight management and regular exercise should be initiated or continued. - Recommend a download be obtained in 30 days and sleep clinic evaluation after 4 weeks of therapy.   [Electronically signed] 09/09/2018 05:58 PM  Nicki Guadalajara MD,  [facility],  ABSM Diplomate, American Board of Sleep Medicine   NPI: [id number] [facility] PH: [phone]   FX: [phone] ACCREDITED BY THE AMERICAN ACADEMY OF SLEEP MEDICINE

## 2018-09-12 ENCOUNTER — Encounter (HOSPITAL_COMMUNITY)
Admission: RE | Admit: 2018-09-12 | Discharge: 2018-09-12 | Disposition: A | Discharge status: Home / Self Care | Payer: 59 | Source: Ambulatory Visit | Attending: Cardiology | Admitting: Cardiology

## 2018-09-12 ENCOUNTER — Encounter (HOSPITAL_COMMUNITY): Payer: 59

## 2018-09-12 DIAGNOSIS — Z951 Presence of aortocoronary bypass graft: Secondary | ICD-10-CM | POA: Diagnosis not present

## 2018-09-13 ENCOUNTER — Telehealth: Payer: Self-pay | Admitting: *Deleted

## 2018-09-13 NOTE — Telephone Encounter (Signed)
-----   Message from Lennette Bihari, MD sent at 09/09/2018  6:05 PM EST ----- Burna Mortimer, please set up with DME for CPAP; if pt continues to have central events may need future BiPAP titration.

## 2018-09-13 NOTE — Telephone Encounter (Signed)
Left message sleep study has been completed. CPAP referral has been sent to [facility]. They will contact  Him for set up once they complete getting his insurance benefits.

## 2018-09-14 ENCOUNTER — Encounter (HOSPITAL_COMMUNITY): Payer: 59

## 2018-09-14 ENCOUNTER — Encounter (HOSPITAL_COMMUNITY)
Admission: RE | Admit: 2018-09-14 | Discharge: 2018-09-14 | Disposition: A | Discharge status: Home / Self Care | Payer: 59 | Source: Ambulatory Visit | Attending: Cardiology | Admitting: Cardiology

## 2018-09-14 DIAGNOSIS — Z951 Presence of aortocoronary bypass graft: Secondary | ICD-10-CM | POA: Diagnosis not present

## 2018-09-15 ENCOUNTER — Encounter (HOSPITAL_COMMUNITY): Payer: Self-pay

## 2018-09-15 NOTE — Progress Notes (Signed)
Cardiac Individual Treatment Plan  Patient Details  Name: Doy Taaffe MRN: [id number] Date of Birth: Feb 18, 1961 Referring Provider:   Flowsheet Row CARDIAC REHAB PHASE II ORIENTATION from 06/23/2018 in [facility] [place]  Referring Provider  Lelon Perla MD       Initial Encounter Date:  [place] PHASE II ORIENTATION from 06/23/2018 in [facility]  Date  06/23/18      Visit Diagnosis: S/P CABG x 3  Patient's Home Medications on Admission:  Current Outpatient Medications:  .  acetaminophen (TYLENOL) 325 MG tablet, Take 325 mg by mouth every 6 (six) hours as needed., Disp: , Rfl:  .  aspirin EC 81 MG tablet, Take 81 mg by mouth daily., Disp: , Rfl:  .  atorvastatin (LIPITOR) 80 MG tablet, Take 1 tablet (80 mg total) by mouth daily at 6 PM., Disp: 30 tablet, Rfl: 1 .  lisinopril (PRINIVIL,ZESTRIL) 2.5 MG tablet, Take 1 tablet (2.5 mg total) by mouth daily., Disp: 30 tablet, Rfl: 1 .  metoprolol tartrate (LOPRESSOR) 25 MG tablet, TAKE 0.5 TABLETS (12.5 MG TOTAL) BY MOUTH 2 (TWO) TIMES DAILY., Disp: 90 tablet, Rfl: 1  Past Medical History: Past Medical History:  Diagnosis Date  . Hyperlipemia     Tobacco Use: Social History   Tobacco Use  Smoking Status Former Smoker  . Types: Cigarettes  . Start date: 10/26/1982  . Last attempt to quit: 04/20/2018  . Years since quitting: 0.4  Smokeless Tobacco Never Used  Tobacco Comment   smoked about 20 cigarrets every 2 days.     Labs: Recent Review Flowsheet Data    Labs for ITP Cardiac and Pulmonary Rehab Latest Ref Rng & Units 04/23/2018 04/23/2018 04/23/2018 04/24/2018 06/30/2018   Cholestrol <200 mg/dL - - - - 123   LDLCALC mg/dL (calc) - - - - 65   HDL >40 mg/dL - - - - 44   Trlycerides <150 mg/dL - - - - 67   Hemoglobin A1c <5.7 % of total Hgb - - - - 6.0(H)   PHART 7.350 - 7.450 7.343(L) 7.357 - - -   PCO2ART 32.0 - 48.0 mmHg 43.2 43.0 - - -   HCO3  20.0 - 28.0 mmol/L 23.6 24.2 - - -   TCO2 22 - 32 mmol/L '25 26 22 23 '$ -   ACIDBASEDEF 0.0 - 2.0 mmol/L 2.0 1.0 - - -   O2SAT % 97.0 98.0 - - -      Capillary Blood Glucose: Lab Results  Component Value Date   GLUCAP 122 (H) 04/27/2018   GLUCAP 159 (H) 04/26/2018   GLUCAP 125 (H) 04/26/2018   GLUCAP 67 (L) 04/26/2018   GLUCAP 136 (H) 04/26/2018     Exercise Target Goals: Exercise Program Goal: Individual exercise prescription set using results from initial 6 min walk test and THRR while considering  patient's activity barriers and safety.   Exercise Prescription Goal: Initial exercise prescription builds to 30-45 minutes a day of aerobic activity, 2-3 days per week.  Home exercise guidelines will be given to patient during program as part of exercise prescription that the participant will acknowledge.  Activity Barriers & Risk Stratification: Activity Barriers & Cardiac Risk Stratification - 06/23/18 1209    Activity Barriers & Cardiac Risk Stratification          Activity Barriers  None    Cardiac Risk Stratification  High  6 Minute Walk: 6 Minute Walk    6 Minute Walk    Row Name 06/23/18 1208 09/09/18 1229   Phase  Initial  Discharge   Distance  1652 feet  2155 feet   Distance % Change  no documentation  30.45 %   Distance Feet Change  no documentation  503 ft   Walk Time  6 minutes  6 minutes   # of Rest Breaks  0  0   MPH  3.13  4.08   METS  3.85  4.94   RPE  10  11   Perceived Dyspnea   0  no documentation   VO2 Peak  13.5  17.29   Symptoms  No  No   Resting HR  66 bpm  72 bpm   Resting BP  110/64  112/68   Resting Oxygen Saturation   98 %  no documentation   Exercise Oxygen Saturation  during 6 min walk  99 %  no documentation   Max Ex. HR  95 bpm  108 bpm   Max Ex. BP  120/70  134/64   2 Minute Post BP  128/74  110/62          Oxygen Initial Assessment:   Oxygen Re-Evaluation:   Oxygen Discharge (Final Oxygen  Re-Evaluation):   Initial Exercise Prescription: Initial Exercise Prescription - 06/23/18 1200    Date of Initial Exercise RX and Referring Provider          Date  06/23/18    Referring Provider  Lelon Perla MD     Expected Discharge Date  09/16/18        Treadmill          MPH  3    Grade  0    Minutes  10    METs  3.3        Bike          Level  1    Minutes  10    METs  3.1        NuStep          Level  2    SPM  80    Minutes  10    METs  2.8        Prescription Details          Frequency (times per week)  3x    Duration  Progress to 30 minutes of continuous aerobic without signs/symptoms of physical distress        Intensity          THRR 40-80% of Max Heartrate  64-129    Ratings of Perceived Exertion  11-13    Perceived Dyspnea  0-4        Progression          Progression  Continue progressive overload as per policy without signs/symptoms or physical distress.        Resistance Training          Training Prescription  Yes    Weight  3lbs    Reps  10-15           Perform Capillary Blood Glucose checks as needed.  Exercise Prescription Changes: Exercise Prescription Changes    Response to Exercise    Row Name 06/29/18 1000 07/11/18 1608 07/27/18 1233 08/03/18 1537 08/24/18 1432   Blood Pressure (Admit)  114/64  124/60  119/72  118/62  116/72   Blood Pressure (Exercise)  138/78  128/60  112/72  142/58  150/72   Blood Pressure (Exit)  114/70  108/60  122/70  102/60  104/68   Heart Rate (Admit)  70 bpm  68 bpm  63 bpm  65 bpm  64 bpm   Heart Rate (Exercise)  97 bpm  98 bpm  120 bpm  118 bpm  122 bpm   Heart Rate (Exit)  70 bpm  78 bpm  73 bpm  75 bpm  74 bpm   Rating of Perceived Exertion (Exercise)  '11  12  13  13  13   '$ Perceived Dyspnea (Exercise)  0  0  0  0  0   Symptoms  None   None   None   None   None    Comments  Pt oriented to exercise equipment   no documentation  Added Elliptical to Exercise Prescription  Pt has started  jogging in rehab  no documentation   Duration  Continue with 45 min of aerobic exercise without signs/symptoms of physical distress.  Continue with 45 min of aerobic exercise without signs/symptoms of physical distress.  Continue with 45 min of aerobic exercise without signs/symptoms of physical distress.  Continue with 45 min of aerobic exercise without signs/symptoms of physical distress.  Continue with 45 min of aerobic exercise without signs/symptoms of physical distress.   Intensity  THRR unchanged  THRR unchanged  THRR unchanged  THRR unchanged  THRR unchanged       Progression    Row Name 06/29/18 1000 07/11/18 1608 07/27/18 1233 08/03/18 1537 08/24/18 1432   Progression  Continue to progress workloads to maintain intensity without signs/symptoms of physical distress.  Continue to progress workloads to maintain intensity without signs/symptoms of physical distress.  Continue to progress workloads to maintain intensity without signs/symptoms of physical distress.  Continue to progress workloads to maintain intensity without signs/symptoms of physical distress.  Continue to progress workloads to maintain intensity without signs/symptoms of physical distress.   Average METs  2.95  4.1  6.91  7.91  7.72       Resistance Training    Row Name 06/29/18 1000 07/11/18 1608 07/27/18 1233 08/03/18 1537 08/24/18 1432   Training Prescription  No  Yes  No  No  No   Weight  no documentation  5lbs  no documentation  no documentation  no documentation   Reps  no documentation  10-15  no documentation  no documentation  no documentation   Time  no documentation  10 Minutes  no documentation  no documentation  no documentation       Interval Training    Row Name 06/29/18 1000 07/11/18 1608 07/27/18 1233 08/03/18 1537 08/24/18 1432   Interval Training  No  No  No  Yes  Yes   Equipment  no documentation  no documentation  no documentation  Treadmill  Treadmill   Comments  no documentation  no documentation   no documentation  Pt is jogging for 4 min. and walking 6 min.   Pt is jogging for 4 min. and walking 6 min.        Treadmill    Row Name 06/29/18 1000 07/11/18 1608 07/27/18 1233 08/03/18 1537 08/24/18 1432   MPH  3  3  3.7  6.5 walking: 3.7/3.5  6.5 walking: 3.7/3.5   Grade  0  2  3.5  0  0   Minutes  '10  10  10  10 '$ Walking: 6 min Jogging: 4 min.  10 Walking: 6 min Jogging: 4 min.    METs  3.3  4.12  5.62  7.75  7.75       Bike    Row Name 06/29/18 1000 07/11/18 1608 07/27/18 1233 08/03/18 1537 08/24/18 1432   Level  1  2  no documentation  no documentation  no documentation   Minutes  10  10  no documentation  no documentation  no documentation   METs  3.07  5.1  no documentation  no documentation  no documentation       [place] Name 06/29/18 1000 07/11/18 1608 07/27/18 1233 08/03/18 1537 08/24/18 1432   Level  '2  5  5  6  6   '$ SPM  85  95  115  115  115   Minutes  '10  10  10  10  10   '$ METs  2.5  3.2  8.2  7.5  8.2       Elliptical    Row Name 06/29/18 1000 07/11/18 1608 07/27/18 1233 08/03/18 1537 08/24/18 1432   Level  no documentation  no documentation  '2  2  4   '$ Speed  no documentation  no documentation  '2  2  2   '$ Minutes  no documentation  no documentation  '10  10  10   '$ METs  no documentation  no documentation  8.2  8.5  7.2       Home Exercise Plan    Row Name 06/29/18 1000 07/11/18 1608 07/27/18 1233 08/03/18 1537 08/24/18 1432   Plans to continue exercise at  no documentation  Community Facility (comment) [facility], Walking  Community Facility (comment) [facility], Walking  Community Facility (comment) [facility], Walking  Community Facility (comment) [facility], Walking   Frequency  no documentation  Add 2 additional days to program exercise sessions.  Add 2 additional days to program exercise sessions.  Add 2 additional days to program exercise sessions.  Add 2 additional days to program exercise sessions.   Initial Home Exercises Provided  no documentation   07/11/18  07/11/18  07/11/18  07/11/18       Response to Exercise    Row Name 09/13/18 1440   Blood Pressure (Admit)  106/70   Blood Pressure (Exercise)  124/68   Blood Pressure (Exit)  110/66   Heart Rate (Admit)  67 bpm   Heart Rate (Exercise)  116 bpm   Heart Rate (Exit)  66 bpm   Rating of Perceived Exertion (Exercise)  12   Perceived Dyspnea (Exercise)  0   Symptoms  None    Duration  Continue with 45 min of aerobic exercise without signs/symptoms of physical distress.   Intensity  THRR unchanged       Progression    Row Name 09/13/18 1440   Progression  Continue to progress workloads to maintain intensity without signs/symptoms of physical distress.   Average METs  7.72       Resistance Training    Row Name 09/13/18 1440   Training Prescription  No       Interval Training    Row Name 09/13/18 1440   Interval Training  Yes   Equipment  Treadmill   Comments  Pt is jogging for 4 min. and walking 6 min.        Treadmill    Row Name 09/13/18 1440   MPH  6.5 walking: 3.7/3.5   Grade  0  Minutes  10 Walking: 6 min Jogging: 4 min.    METs  7.75       NuStep    Row Name 09/13/18 1440   Level  6   SPM  115   Minutes  10   METs  8.2       Elliptical    Row Name 09/13/18 1440   Level  4   Speed  2   Minutes  10   METs  7.2       Home Exercise Plan    Row Name 09/13/18 1440   Plans to continue exercise at  [facility] (comment) [facility], Walking   Frequency  Add 2 additional days to program exercise sessions.   Initial Home Exercises Provided  07/11/18          Exercise Comments: Exercise Comments    Row Name 06/29/18 1003 07/11/18 1518 08/10/18 1639 08/24/18 1436 09/14/18 0931   Exercise Comments  Pt oriented to exericse equipment. Pt's first day of exericse. Pt responded well to exercise and is off to good start. Will continue to monitor.   Reviewed HEP with pt. Pt verbalized understanding the importance of adding exercise to weekly routine  in addition to his golf routine. Pt interested in trying elliptical in rehab. Will work with pt to start new machine this week.    Reviewed METs and goals with Pt.   Reviewed METs and goals with Pt. Pt increases MET levels every session. Has added jogging and interval training on the treamill.   Reviewed METs and goals with Pt. Pt will continue to exercise at a local gym in addition to cardiac rehab.       Exercise Goals and Review: Exercise Goals    Exercise Goals    Row Name 06/23/18 1209   Increase Physical Activity  Yes   Intervention  Provide advice, education, support and counseling about physical activity/exercise needs.;Develop an individualized exercise prescription for aerobic and resistive training based on initial evaluation findings, risk stratification, comorbidities and participant's personal goals.   Expected Outcomes  Short Term: Attend rehab on a regular basis to increase amount of physical activity.;Long Term: Add in home exercise to make exercise part of routine and to increase amount of physical activity.;Long Term: Exercising regularly at least 3-5 days a week.   Increase Strength and Stamina  Yes   Intervention  Provide advice, education, support and counseling about physical activity/exercise needs.;Develop an individualized exercise prescription for aerobic and resistive training based on initial evaluation findings, risk stratification, comorbidities and participant's personal goals.   Expected Outcomes  Short Term: Increase workloads from initial exercise prescription for resistance, speed, and METs.;Long Term: Improve cardiorespiratory fitness, muscular endurance and strength as measured by increased METs and functional capacity (6MWT);Short Term: Perform resistance training exercises routinely during rehab and add in resistance training at home   Able to understand and use rate of perceived exertion (RPE) scale  Yes   Intervention  Provide education and explanation on how  to use RPE scale   Expected Outcomes  Short Term: Able to use RPE daily in rehab to express subjective intensity level;Long Term:  Able to use RPE to guide intensity level when exercising independently   Knowledge and understanding of Target Heart Rate Range (THRR)  Yes   Intervention  Provide education and explanation of THRR including how the numbers were predicted and where they are located for reference   Expected Outcomes  Short Term: Able to state/look up  THRR;Long Term: Able to use THRR to govern intensity when exercising independently;Short Term: Able to use daily as guideline for intensity in rehab   Able to check pulse independently  Yes   Intervention  Provide education and demonstration on how to check pulse in carotid and radial arteries.;Review the importance of being able to check your own pulse for safety during independent exercise   Expected Outcomes  Short Term: Able to explain why pulse checking is important during independent exercise;Long Term: Able to check pulse independently and accurately   Intervention  Provide education, explanation, and written materials on patient's individual exercise prescription   Expected Outcomes  Short Term: Able to explain program exercise prescription;Long Term: Able to explain home exercise prescription to exercise independently          Exercise Goals Re-Evaluation : Exercise Goals Re-Evaluation    Exercise Goal Re-Evaluation    Row Name 07/11/18 1515 08/10/18 1638 08/24/18 1434 09/14/18 0929   Exercise Goals Review  Increase Physical Activity;Able to understand and use rate of perceived exertion (RPE) scale;Knowledge and understanding of Target Heart Rate Range (THRR);Understanding of Exercise Prescription;Increase Strength and Stamina;Able to check pulse independently  Increase Physical Activity;Able to understand and use rate of perceived exertion (RPE) scale;Understanding of Exercise Prescription;Knowledge and understanding of Target  Heart Rate Range (THRR);Increase Strength and Stamina;Able to check pulse independently  Increase Physical Activity;Increase Strength and Stamina;Able to check pulse independently;Understanding of Exercise Prescription;Knowledge and understanding of Target Heart Rate Range (THRR);Able to understand and use rate of perceived exertion (RPE) scale  Increase Physical Activity;Increase Strength and Stamina;Able to check pulse independently;Understanding of Exercise Prescription;Knowledge and understanding of Target Heart Rate Range (THRR);Able to understand and use rate of perceived exertion (RPE) scale   Comments  Reviewed HEP with pt. Also reviewed THRR, RPE Scale, weather precautions, endpoints of exericse, NTG use, warmup and cool down. Explained to pt the difference between physical activity and exercise. Pt golfs alot, but does not exercise.  Patient will continue walking and biking in addition to CR exercise.   Reviewed METs and goals with Pt. Pt continues to progress workloads. Pt is tolerating exercise well.   Reviewed METs and goals with Pt. Pt continues to progress workloads and has a MET level of 8.0. Pt is exercising at a local gym for 60 minutes 2 days per week in addition to cardiac rehab.    Expected Outcomes  Pt will plan to walk for exercise 2-3 days a week, 30-45 minutes. Pt plans to start going to gym near home. Will continue to follow up with pt.   Will continue to monitor and progress Pt as tolerated.   Will continue to monitor and progress Pt as tolerated.   Will continue to monitor and progress Pt as tolertaed.           Discharge Exercise Prescription (Final Exercise Prescription Changes): Exercise Prescription Changes - 09/13/18 1440    Response to Exercise          Blood Pressure (Admit)  106/70    Blood Pressure (Exercise)  124/68    Blood Pressure (Exit)  110/66    Heart Rate (Admit)  67 bpm    Heart Rate (Exercise)  116 bpm    Heart Rate (Exit)  66 bpm    Rating of  Perceived Exertion (Exercise)  12    Perceived Dyspnea (Exercise)  0    Symptoms  None     Duration  Continue with 45 min of aerobic exercise  without signs/symptoms of physical distress.    Intensity  THRR unchanged        Progression          Progression  Continue to progress workloads to maintain intensity without signs/symptoms of physical distress.    Average METs  7.72        Resistance Training          Training Prescription  No        Interval Training          Interval Training  Yes    Equipment  Treadmill    Comments  Pt is jogging for 4 min. and walking 6 min.         Treadmill          MPH  6.5   walking: 3.7/3.5   Grade  0    Minutes  10   Walking: 6 min Jogging: 4 min.    METs  7.75        NuStep          Level  6    SPM  115    Minutes  10    METs  8.2        Elliptical          Level  4    Speed  2    Minutes  10    METs  7.2        Home Exercise Plan          Plans to continue exercise at  [facility] (comment)   Local [facility], Walking   Frequency  Add 2 additional days to program exercise sessions.    Initial Home Exercises Provided  07/11/18           Nutrition:  Target Goals: Understanding of nutrition guidelines, daily intake of sodium '1500mg'$ , cholesterol '200mg'$ , calories 30% from fat and 7% or less from saturated fats, daily to have 5 or more servings of fruits and vegetables.  Biometrics: Pre Biometrics - 06/23/18 1210    Pre Biometrics          Height  5' 8.5" (1.74 m)    Weight  91.3 kg    Waist Circumference  39 inches    Hip Circumference  41 inches    Waist to Hip Ratio  0.95 %    BMI (Calculated)  30.16    Triceps Skinfold  12 mm    % Body Fat  26.7 %    Grip Strength  40 kg    Flexibility  15 in    Single Leg Stand  30 seconds          Post Biometrics - 09/09/18 1231     Post  Biometrics          Height  5' 8.5" (1.74 m)    Weight  92.2 kg    Waist Circumference  40 inches    Hip Circumference   42 inches    Waist to Hip Ratio  0.95 %    BMI (Calculated)  30.45    Triceps Skinfold  15 mm    % Body Fat  28.2 %    Grip Strength  37 kg    Flexibility  13 in    Single Leg Stand  35 seconds           Nutrition Therapy Plan and Nutrition Goals: Nutrition Therapy & Goals - 07/29/18 0805    Nutrition Therapy  Diet  heart healthy carb modified        Personal Nutrition Goals          Nutrition Goal  Pt to identify and limit food sources of saturated fat, trans fat, refined carbohydrates and sodium    Personal Goal #2  Pt to identify food quantities necessary to achieve weight loss of 6-24 lbs. at graduation from cardiac rehab. Goal wt of 185 lb desired.     Personal Goal #3  Pt able to name foods that affect blood glucose.        Intervention Plan          Intervention  Prescribe, educate and counsel regarding individualized specific dietary modifications aiming towards targeted core components such as weight, hypertension, lipid management, diabetes, heart failure and other comorbidities.    Expected Outcomes  Short Term Goal: Understand basic principles of dietary content, such as calories, fat, sodium, cholesterol and nutrients.;Long Term Goal: Adherence to prescribed nutrition plan.           Nutrition Assessments: Nutrition Assessments - 07/29/18 0806    MEDFICTS Scores          Pre Score  25           Nutrition Goals Re-Evaluation:   Nutrition Goals Re-Evaluation:   Nutrition Goals Discharge (Final Nutrition Goals Re-Evaluation):   Psychosocial: Target Goals: Acknowledge presence or absence of significant depression and/or stress, maximize coping skills, provide positive support system. Participant is able to verbalize types and ability to use techniques and skills needed for reducing stress and depression.  Initial Review & Psychosocial Screening: Initial Psych Review & Screening - 06/23/18 1053    Initial Review          Current issues  with  None Identified        Family Dynamics          Good Support System?  Yes   Pt lists his wife, children, grandparents, and golf buddies as sources of support.        Barriers          Psychosocial barriers to participate in program  There are no identifiable barriers or psychosocial needs.        Screening Interventions          Interventions  Encouraged to exercise           Quality of Life Scores: Quality of Life - 06/23/18 1120    Quality of Life          Select  Quality of Life        Quality of Life Scores          Health/Function Pre  22.2 %    Socioeconomic Pre  22.5 %    Psych/Spiritual Pre  22.71 %    Family Pre  24.5 %    GLOBAL Pre  22.7 %          Scores of 19 and below usually indicate a poorer quality of life in these areas.  A difference of  2-3 points is a clinically meaningful difference.  A difference of 2-3 points in the total score of the Quality of Life Index has been associated with significant improvement in overall quality of life, self-image, physical symptoms, and general health in studies assessing change in quality of life.  PHQ-9: Recent Review Flowsheet Data    Depression screen [facility] 2/9 05/10/2018   Decreased Interest 0   Down, Depressed, Hopeless 0   PHQ -  2 Score 0     Interpretation of Total Score  Total Score Depression Severity:  1-4 = Minimal depression, 5-9 = Mild depression, 10-14 = Moderate depression, 15-19 = Moderately severe depression, 20-27 = Severe depression   Psychosocial Evaluation and Intervention: Psychosocial Evaluation - 06/29/18 1128    Psychosocial Evaluation & Interventions          Interventions  Encouraged to exercise with the program and follow exercise prescription    Comments  no psychosocial needs identified, no interventions necessary. pt enjoys playing golf, has been putting, chipping and 3/4 swings following Dr. Everrett Coombe recommendation.     Expected Outcomes  pt will exhibit positive  outlook with good coping skills.     Continue Psychosocial Services   No Follow up required           Psychosocial Re-Evaluation: Psychosocial Re-Evaluation    Psychosocial Re-Evaluation    Row Name 07/20/18 1116 08/17/18 1118 08/22/18 0734 09/07/18 0921   Current issues with  None Identified  None Identified  None Identified  None Identified   Comments  no psychosocial needs identified, no interventions necessary   no psychosocial needs identified, no interventions necessary   no psychosocial needs identified, no interventions necessary   no psychosocial needs identified, no interventions necessary    Expected Outcomes  pt will exhibit positive outlook with good coping skills.   pt will exhibit positive outlook with good coping skills.   pt will exhibit positive outlook with good coping skills.   pt will exhibit positive outlook with good coping skills.    Interventions  Encouraged to attend Cardiac Rehabilitation for the exercise  Encouraged to attend Cardiac Rehabilitation for the exercise  Encouraged to attend Cardiac Rehabilitation for the exercise  Encouraged to attend Cardiac Rehabilitation for the exercise   Continue Psychosocial Services   No Follow up required  No Follow up required  No Follow up required  No Follow up required          Psychosocial Discharge (Final Psychosocial Re-Evaluation): Psychosocial Re-Evaluation - 09/07/18 0921    Psychosocial Re-Evaluation          Current issues with  None Identified    Comments  no psychosocial needs identified, no interventions necessary     Expected Outcomes  pt will exhibit positive outlook with good coping skills.     Interventions  Encouraged to attend Cardiac Rehabilitation for the exercise    Continue Psychosocial Services   No Follow up required           Vocational Rehabilitation: Provide vocational rehab assistance to qualifying candidates.   Vocational Rehab Evaluation & Intervention: Vocational Rehab - 06/23/18  1121    Initial Vocational Rehab Evaluation & Intervention          Assessment shows need for Vocational Rehabilitation  No           Education: Education Goals: Education classes will be provided on a weekly basis, covering required topics. Participant will state understanding/return demonstration of topics presented.  Learning Barriers/Preferences: Learning Barriers/Preferences - 06/23/18 1245    Learning Barriers/Preferences          Learning Barriers  Sight    Learning Preferences  Skilled Demonstration           Education Topics: Count Your Pulse:  -Group instruction provided by verbal instruction, demonstration, patient participation and written materials to support subject.  Instructors address importance of being able to find your pulse and how to count your  pulse when at home without a heart monitor.  Patients get hands on experience counting their pulse with staff help and individually.   Heart Attack, Angina, and Risk Factor Modification:  -Group instruction provided by verbal instruction, video, and written materials to support subject.  Instructors address signs and symptoms of angina and heart attacks.    Also discuss risk factors for heart disease and how to make changes to improve heart health risk factors.   Functional Fitness:  -Group instruction provided by verbal instruction, demonstration, patient participation, and written materials to support subject.  Instructors address safety measures for doing things around the house.  Discuss how to get up and down off the floor, how to pick things up properly, how to safely get out of a chair without assistance, and balance training.   Meditation and Mindfulness:  -Group instruction provided by verbal instruction, patient participation, and written materials to support subject.  Instructor addresses importance of mindfulness and meditation practice to help reduce stress and improve awareness.  Instructor also leads  participants through a meditation exercise.    Stretching for Flexibility and Mobility:  -Group instruction provided by verbal instruction, patient participation, and written materials to support subject.  Instructors lead participants through series of stretches that are designed to increase flexibility thus improving mobility.  These stretches are additional exercise for major muscle groups that are typically performed during regular warm up and cool down.   Hands Only CPR:  -Group verbal, video, and participation provides a basic overview of AHA guidelines for community CPR. Role-play of emergencies allow participants the opportunity to practice calling for help and chest compression technique with discussion of AED use.   Hypertension: -Group verbal and written instruction that provides a basic overview of hypertension including the most recent diagnostic guidelines, risk factor reduction with self-care instructions and medication management.    Nutrition I class: Heart Healthy Eating:  -Group instruction provided by PowerPoint slides, verbal discussion, and written materials to support subject matter. The instructor gives an explanation and review of the Therapeutic Lifestyle Changes diet recommendations, which includes a discussion on lipid goals, dietary fat, sodium, fiber, plant stanol/sterol esters, sugar, and the components of a well-balanced, healthy diet.   Nutrition II class: Lifestyle Skills:  -Group instruction provided by PowerPoint slides, verbal discussion, and written materials to support subject matter. The instructor gives an explanation and review of label reading, grocery shopping for heart health, heart healthy recipe modifications, and ways to make healthier choices when eating out.   Diabetes Question & Answer:  -Group instruction provided by PowerPoint slides, verbal discussion, and written materials to support subject matter. The instructor gives an explanation  and review of diabetes co-morbidities, pre- and post-prandial blood glucose goals, pre-exercise blood glucose goals, signs, symptoms, and treatment of hypoglycemia and hyperglycemia, and foot care basics.   Diabetes Blitz:  -Group instruction provided by PowerPoint slides, verbal discussion, and written materials to support subject matter. The instructor gives an explanation and review of the physiology behind type 1 and type 2 diabetes, diabetes medications and rational behind using different medications, pre- and post-prandial blood glucose recommendations and Hemoglobin A1c goals, diabetes diet, and exercise including blood glucose guidelines for exercising safely.    Portion Distortion:  -Group instruction provided by PowerPoint slides, verbal discussion, written materials, and food models to support subject matter. The instructor gives an explanation of serving size versus portion size, changes in portions sizes over the last 20 years, and what consists of a serving  from each food group.   Stress Management:  -Group instruction provided by verbal instruction, video, and written materials to support subject matter.  Instructors review role of stress in heart disease and how to cope with stress positively.     Exercising on Your Own:  -Group instruction provided by verbal instruction, power point, and written materials to support subject.  Instructors discuss benefits of exercise, components of exercise, frequency and intensity of exercise, and end points for exercise.  Also discuss use of nitroglycerin and activating EMS.  Review options of places to exercise outside of rehab.  Review guidelines for sex with heart disease.   Cardiac Drugs I:  -Group instruction provided by verbal instruction and written materials to support subject.  Instructor reviews cardiac drug classes: antiplatelets, anticoagulants, beta blockers, and statins.  Instructor discusses reasons, side effects, and lifestyle  considerations for each drug class. Flowsheet Row CARDIAC REHAB PHASE II EXERCISE from 07/06/2018 in [place]  Date  07/06/18  Instruction Review Code  2- Demonstrated Understanding      Cardiac Drugs II:  -Group instruction provided by verbal instruction and written materials to support subject.  Instructor reviews cardiac drug classes: angiotensin converting enzyme inhibitors (ACE-I), angiotensin II receptor blockers (ARBs), nitrates, and calcium channel blockers.  Instructor discusses reasons, side effects, and lifestyle considerations for each drug class.   Anatomy and Physiology of the Circulatory System:  Group verbal and written instruction and models provide basic cardiac anatomy and physiology, with the coronary electrical and arterial systems. Review of: AMI, Angina, Valve disease, Heart Failure, Peripheral Artery Disease, Cardiac Arrhythmia, Pacemakers, and the ICD.   Other Education:  -Group or individual verbal, written, or video instructions that support the educational goals of the cardiac rehab program.   Holiday Eating Survival Tips:  -Group instruction provided by PowerPoint slides, verbal discussion, and written materials to support subject matter. The instructor gives patients tips, tricks, and techniques to help them not only survive but enjoy the holidays despite the onslaught of food that accompanies the holidays.   Knowledge Questionnaire Score: Knowledge Questionnaire Score - 06/23/18 1120    Knowledge Questionnaire Score          Pre Score  22/24           Core Components/Risk Factors/Patient Goals at Admission: Personal Goals and Risk Factors at Admission - 06/23/18 1332    Core Components/Risk Factors/Patient Goals on Admission          Intervention  Weight Management: Develop a combined nutrition and exercise program designed to reach desired caloric intake, while maintaining appropriate intake of nutrient and fiber,  sodium and fats, and appropriate energy expenditure required for the weight goal.;Weight Management/Obesity: Establish reasonable short term and long term weight goals.;Weight Management: Provide education and appropriate resources to help participant work on and attain dietary goals.;Obesity: Provide education and appropriate resources to help participant work on and attain dietary goals.    Admit Weight  201 lb 4.5 oz (91.3 kg)    Goal Weight: Short Term  190 lb (86.2 kg)    Goal Weight: Long Term  185 lb (83.9 kg)    Expected Outcomes  Long Term: Adherence to nutrition and physical activity/exercise program aimed toward attainment of established weight goal;Short Term: Continue to assess and modify interventions until short term weight is achieved;Weight Loss: Understanding of general recommendations for a balanced deficit meal plan, which promotes 1-2 lb weight loss per week and includes a negative energy  balance of [phone] kcal/d;Understanding recommendations for meals to include 15-35% energy as protein, 25-35% energy from fat, 35-60% energy from carbohydrates, less than '200mg'$  of dietary cholesterol, 20-35 gm of total fiber daily;Understanding of distribution of calorie intake throughout the day with the consumption of 4-5 meals/snacks;Weight Gain: Understanding of general recommendations for a high calorie, high protein meal plan that promotes weight gain by distributing calorie intake throughout the day with the consumption for 4-5 meals, snacks, and/or supplements    Hypertension  Yes    Intervention  Provide education on lifestyle modifcations including regular physical activity/exercise, weight management, moderate sodium restriction and increased consumption of fresh fruit, vegetables, and low fat dairy, alcohol moderation, and smoking cessation.;Monitor prescription use compliance.    Expected Outcomes  Short Term: Continued assessment and intervention until BP is < 140/43m HG in hypertensive  participants. < 130/880mHG in hypertensive participants with diabetes, heart failure or chronic kidney disease.;Long Term: Maintenance of blood pressure at goal levels.    Lipids  Yes    Intervention  Provide education and support for participant on nutrition & aerobic/resistive exercise along with prescribed medications to achieve LDL '70mg'$ , HDL >'40mg'$ .    Expected Outcomes  Short Term: Participant states understanding of desired cholesterol values and is compliant with medications prescribed. Participant is following exercise prescription and nutrition guidelines.;Long Term: Cholesterol controlled with medications as prescribed, with individualized exercise RX and with personalized nutrition plan. Value goals: LDL < '70mg'$ , HDL > 40 mg.           Core Components/Risk Factors/Patient Goals Review:  Goals and Risk Factor Review    Core Components/Risk Factors/Patient Goals Review    Row Name 06/29/18 1129 07/20/18 1117 08/17/18 1118 08/22/18 0734 09/07/18 0921   Personal Goals Review  Weight Management/Obesity;Hypertension;Lipids  Weight Management/Obesity;Hypertension;Lipids  Weight Management/Obesity;Hypertension;Lipids  Weight Management/Obesity;Hypertension;Lipids  Weight Management/Obesity;Hypertension;Lipids   Review  pt with significant CAD RF demonstrates eagerness to participate in CR program. pt personal goal is to lose weight and complete CR program to gain insight and guidance to make healthy changes.    pt with significant CAD RF demonstrates eagerness to participate in CR program. pt personal goal is to lose weight and complete CR program to gain insight and guidance to make healthy changes.  pt demonstrates significant CR workload progression  and enjoys using elliptical.   pt with significant CAD RF demonstrates eagerness to participate in CR program. pt personal goal is to lose weight and complete CR program to gain insight and guidance to make healthy changes.  pt demonstrates  significant CR workload progression  and enjoys using elliptical.   pt with significant CAD RF demonstrates eagerness to participate in CR program. pt personal goal is to lose weight and complete CR program to gain insight and guidance to make healthy changes.  pt demonstrates significant CR workload progression  and enjoys using elliptical. pt verbalizes overall increased strength/stamina, especially on Nustep.   pt with significant CAD RF demonstrates eagerness to participate in CR program. pt personal goal is to lose weight and complete CR program to gain insight and guidance to make healthy changes.  pt demonstrates significant CR workload progression  and enjoys using elliptical. pt verbalizes overall increased strength/stamina, especially on Nustep.    Expected Outcomes  pt will participate in CR exercise, nutrition and lifestyle modification to decrease overall CAD RF.    pt will participate in CR exercise, nutrition and lifestyle modification to decrease overall CAD RF.    pt will  participate in CR exercise, nutrition and lifestyle modification to decrease overall CAD RF.    pt will participate in CR exercise, nutrition and lifestyle modification to decrease overall CAD RF.    pt will participate in CR exercise, nutrition and lifestyle modification to decrease overall CAD RF.            Core Components/Risk Factors/Patient Goals at Discharge (Final Review):  Goals and Risk Factor Review - 09/07/18 0921    Core Components/Risk Factors/Patient Goals Review          Personal Goals Review  Weight Management/Obesity;Hypertension;Lipids    Review  pt with significant CAD RF demonstrates eagerness to participate in CR program. pt personal goal is to lose weight and complete CR program to gain insight and guidance to make healthy changes.  pt demonstrates significant CR workload progression  and enjoys using elliptical. pt verbalizes overall increased strength/stamina, especially on Nustep.     Expected  Outcomes  pt will participate in CR exercise, nutrition and lifestyle modification to decrease overall CAD RF.             ITP Comments: ITP Comments    Row Name 06/23/18 1052 06/29/18 1127 07/19/18 1533 08/17/18 1118 08/22/18 0733   ITP Comments  Dr. Fransico Him, Medical Director  pt started group exercise session. pt tolerated light activity without difficulty. pt oriented to exercise equipment and safety routine.   30 day ITP review.  pt with good attendance and participation.   30 day ITP review.  pt with good attendance and participation.   30 day ITP review.  pt with good attendance and participation.    [place] Name 09/15/18 0709   ITP Comments  30 day ITP review.  pt with good attendance and participation. pt eagerly anticipating his graduation from cardiac rehab       Comments:

## 2018-09-16 ENCOUNTER — Encounter (HOSPITAL_COMMUNITY): Payer: 59

## 2018-09-16 ENCOUNTER — Encounter (HOSPITAL_COMMUNITY)
Admission: RE | Admit: 2018-09-16 | Discharge: 2018-09-16 | Disposition: A | Discharge status: Home / Self Care | Payer: 59 | Source: Ambulatory Visit | Attending: Cardiology | Admitting: Cardiology

## 2018-09-16 DIAGNOSIS — Z951 Presence of aortocoronary bypass graft: Secondary | ICD-10-CM | POA: Diagnosis not present

## 2018-09-19 ENCOUNTER — Encounter (HOSPITAL_COMMUNITY)
Admission: RE | Admit: 2018-09-19 | Discharge: 2018-09-19 | Disposition: A | Discharge status: Home / Self Care | Payer: 59 | Source: Ambulatory Visit | Attending: Cardiology | Admitting: Cardiology

## 2018-09-19 ENCOUNTER — Encounter (HOSPITAL_COMMUNITY): Payer: 59

## 2018-09-19 DIAGNOSIS — Z951 Presence of aortocoronary bypass graft: Secondary | ICD-10-CM

## 2018-09-21 ENCOUNTER — Encounter (HOSPITAL_COMMUNITY): Payer: 59

## 2018-09-21 ENCOUNTER — Encounter (HOSPITAL_COMMUNITY)
Admission: RE | Admit: 2018-09-21 | Discharge: 2018-09-21 | Disposition: A | Discharge status: Home / Self Care | Payer: 59 | Source: Ambulatory Visit | Attending: Cardiology | Admitting: Cardiology

## 2018-09-21 DIAGNOSIS — Z951 Presence of aortocoronary bypass graft: Secondary | ICD-10-CM | POA: Diagnosis not present

## 2018-09-23 ENCOUNTER — Encounter (HOSPITAL_COMMUNITY): Payer: 59

## 2018-09-26 ENCOUNTER — Encounter (HOSPITAL_COMMUNITY)
Admission: RE | Admit: 2018-09-26 | Discharge: 2018-09-26 | Disposition: A | Discharge status: Home / Self Care | Payer: 59 | Source: Ambulatory Visit | Attending: Cardiology | Admitting: Cardiology

## 2018-09-26 ENCOUNTER — Encounter (HOSPITAL_COMMUNITY): Payer: 59

## 2018-09-26 DIAGNOSIS — Z951 Presence of aortocoronary bypass graft: Secondary | ICD-10-CM | POA: Insufficient documentation

## 2018-09-26 DIAGNOSIS — Z79899 Other long term (current) drug therapy: Secondary | ICD-10-CM | POA: Insufficient documentation

## 2018-09-26 DIAGNOSIS — E785 Hyperlipidemia, unspecified: Secondary | ICD-10-CM | POA: Diagnosis not present

## 2018-09-26 DIAGNOSIS — Z87891 Personal history of nicotine dependence: Secondary | ICD-10-CM | POA: Insufficient documentation

## 2018-09-26 DIAGNOSIS — Z79891 Long term (current) use of opiate analgesic: Secondary | ICD-10-CM | POA: Insufficient documentation

## 2018-09-26 DIAGNOSIS — Z7982 Long term (current) use of aspirin: Secondary | ICD-10-CM | POA: Insufficient documentation

## 2018-09-28 ENCOUNTER — Encounter (HOSPITAL_COMMUNITY): Payer: 59

## 2018-09-28 DIAGNOSIS — G4733 Obstructive sleep apnea (adult) (pediatric): Secondary | ICD-10-CM | POA: Diagnosis not present

## 2018-09-30 ENCOUNTER — Other Ambulatory Visit: Payer: Self-pay | Admitting: Adult Health

## 2018-09-30 ENCOUNTER — Encounter (HOSPITAL_COMMUNITY): Payer: 59

## 2018-09-30 ENCOUNTER — Telehealth: Payer: Self-pay | Admitting: *Deleted

## 2018-09-30 NOTE — Telephone Encounter (Signed)
Patient called to say that his CPAP pressure is too high. It is blowing his mask off. Patient has spoken with Victorino Dike at [facility] and she recommended Nasal pillows. I had the patient to come to the office and he was given a sample Dreamwear gel pillows mask. I spoke with Dr Tresa Endo over the phone notifying him of the patient's concern. He ordered me to change his CPAP setting from a set pressure of 15 CmH2O to [employer]et 8-16 CmH2O.

## 2018-10-03 DIAGNOSIS — G4733 Obstructive sleep apnea (adult) (pediatric): Secondary | ICD-10-CM | POA: Diagnosis not present

## 2018-10-06 ENCOUNTER — Ambulatory Visit (INDEPENDENT_AMBULATORY_CARE_PROVIDER_SITE_OTHER): Payer: 59 | Admitting: Internal Medicine

## 2018-10-06 ENCOUNTER — Encounter: Payer: Self-pay | Admitting: Internal Medicine

## 2018-10-06 VITALS — BP 120/80 | HR 74 | Temp 98.2°F | Ht 69.0 in | Wt 205.0 lb

## 2018-10-06 DIAGNOSIS — Z951 Presence of aortocoronary bypass graft: Secondary | ICD-10-CM

## 2018-10-06 DIAGNOSIS — R7302 Impaired glucose tolerance (oral): Secondary | ICD-10-CM | POA: Diagnosis not present

## 2018-10-06 DIAGNOSIS — E559 Vitamin D deficiency, unspecified: Secondary | ICD-10-CM

## 2018-10-06 DIAGNOSIS — D649 Anemia, unspecified: Secondary | ICD-10-CM

## 2018-10-06 DIAGNOSIS — J069 Acute upper respiratory infection, unspecified: Secondary | ICD-10-CM

## 2018-10-06 DIAGNOSIS — E7800 Pure hypercholesterolemia, unspecified: Secondary | ICD-10-CM

## 2018-10-06 DIAGNOSIS — D72829 Elevated white blood cell count, unspecified: Secondary | ICD-10-CM

## 2018-10-06 NOTE — Progress Notes (Signed)
   Subjective:    Patient ID: Zachary [name], male    DOB: 10/05/1961, 57 y.o.   MRN: [id number]  HPI 57 year old Male for follow up of post op anemia and Vitamin D deficiency. Has elevated BMI of 30.27. Likes sweets more since stopped smoking. Has mild elevated Hgb AIC. Graduated from Cardiac Rehab and has gym access at [place].  Hemoglobin has improved from 12.8 g to 13.5 g.  Previously postoperatively was in the 10 range.  Hemoglobin A1c is 6.1% and was 6% in September.  Continue to monitor.  Vitamin D is 32 which is within normal limits and was 28 in September.  Continue vitamin D supplement.  I think he is beginning to feel much better status post CABGx 3.  I think he can continue current medications and follow-up in July which will be time for his health maintenance exam.    Review of Systems see above.  Coming down with URI symptoms and leaving to go out of town.     Objective:   Physical Exam  Neck supple.  Chest clear.  Cardiac exam regular rate and rhythm.  His affect is normal.  Spirits are good.  TMs and pharynx are clear.      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  Continue diet and exercise.  Impaired glucose tolerance-stable with diet.  Continue diet and exercise program.  Vitamin D deficiency-continue vitamin D supplement.  Repeat vitamin D level with this visit is normal.  Elevated LDL- hyperlipidemia-continue treatment  Acute URI.  White blood cell count elevated at 11,500. Prescribed Zithromax Z-[id number]  Plan: Postop anemia seems to be resolved.  Continue diet and exercise program and current medications.  Follow-up July 2020 for physical exam.

## 2018-10-07 ENCOUNTER — Telehealth: Payer: Self-pay | Admitting: Internal Medicine

## 2018-10-07 ENCOUNTER — Encounter: Payer: Self-pay | Admitting: Internal Medicine

## 2018-10-07 LAB — CBC WITH DIFFERENTIAL/PLATELET
BASOS ABS: 35 {cells}/uL (ref 0–200)
Basophils Relative: 0.3 %
EOS ABS: 69 {cells}/uL (ref 15–500)
EOS PCT: 0.6 %
HEMATOCRIT: 40.3 % (ref 38.5–50.0)
Hemoglobin: 13.5 g/dL (ref 13.2–17.1)
LYMPHS ABS: 2243 {cells}/uL (ref 850–3900)
MCH: 29 pg (ref 27.0–33.0)
MCHC: 33.5 g/dL (ref 32.0–36.0)
MCV: 86.7 fL (ref 80.0–100.0)
MPV: 10 fL (ref 7.5–12.5)
Monocytes Relative: 11.3 %
Neutro Abs: 7855 cells/uL — ABNORMAL HIGH (ref 1500–7800)
Neutrophils Relative %: 68.3 %
Platelets: 354 10*3/uL (ref 140–400)
RBC: 4.65 10*6/uL (ref 4.20–5.80)
RDW: 13.8 % (ref 11.0–15.0)
Total Lymphocyte: 19.5 %
WBC mixed population: 1300 cells/uL — ABNORMAL HIGH (ref 200–950)
WBC: 11.5 10*3/uL — ABNORMAL HIGH (ref 3.8–10.8)

## 2018-10-07 LAB — HEMOGLOBIN A1C
Hgb A1c MFr Bld: 6.1 % of total Hgb — ABNORMAL HIGH (ref <5.7)
MEAN PLASMA GLUCOSE: 128 (calc)
eAG (mmol/L): 7.1 (calc)

## 2018-10-07 LAB — VITAMIN D 25 HYDROXY (VIT D DEFICIENCY, FRACTURES): VIT D 25 HYDROXY: 32 ng/mL (ref 30–100)

## 2018-10-07 MED ORDER — AZITHROMYCIN 250 MG PO TABS: ORAL_TABLET | ORAL | 0 refills | Status: DC

## 2018-10-07 MED ORDER — METFORMIN HCL 500 MG PO TABS: ORAL_TABLET | ORAL | 1 refills | Status: DC

## 2018-10-07 NOTE — Telephone Encounter (Signed)
Reported Vitamin D level low normal. Continue supplement. Hgb AIC 6.1%. Start metformin 500 mg daily and follow up late Feb. Pt to make appt. Has green nasal discharge. Leaving for [place]. Call in Z pak for him. WBC elevated due to URI.

## 2018-10-12 NOTE — Progress Notes (Addendum)
Discharge Progress Report  Patient Details  Name: Zachary [name] MRN: [id number] Date of Birth: Feb 28, 1961 Referring Provider:   Flowsheet Row CARDIAC REHAB PHASE II ORIENTATION from 06/23/2018 in [facility]  Referring Provider  Lelon Perla MD        Number of Visits: 35   Reason for Discharge:  Patient has met program and personal goals.  Smoking History:  Social History   Tobacco Use  Smoking Status Former Smoker  . Types: Cigarettes  . Start date: 10/26/1982  . Last attempt to quit: 04/20/2018  . Years since quitting: 0.4  Smokeless Tobacco Never Used  Tobacco Comment   smoked about 20 cigarrets every 2 days.     Diagnosis:  S/P CABG x 3  ADL UCSD:   Initial Exercise Prescription: Initial Exercise Prescription - 06/23/18 1200    Date of Initial Exercise RX and Referring Provider          Date  06/23/18    Referring Provider  Lelon Perla MD     Expected Discharge Date  09/16/18        Treadmill          MPH  3    Grade  0    Minutes  10    METs  3.3        Bike          Level  1    Minutes  10    METs  3.1        NuStep          Level  2    SPM  80    Minutes  10    METs  2.8        Prescription Details          Frequency (times per week)  3x    Duration  Progress to 30 minutes of continuous aerobic without signs/symptoms of physical distress        Intensity          THRR 40-80% of Max Heartrate  64-129    Ratings of Perceived Exertion  11-13    Perceived Dyspnea  0-4        Progression          Progression  Continue progressive overload as per policy without signs/symptoms or physical distress.        Resistance Training          Training Prescription  Yes    Weight  3lbs    Reps  10-15           Discharge Exercise Prescription (Final Exercise Prescription Changes): Exercise Prescription Changes - 09/13/18 1440    Response to Exercise          Blood Pressure (Admit)  106/70     Blood Pressure (Exercise)  124/68    Blood Pressure (Exit)  110/66    Heart Rate (Admit)  67 bpm    Heart Rate (Exercise)  116 bpm    Heart Rate (Exit)  66 bpm    Rating of Perceived Exertion (Exercise)  12    Perceived Dyspnea (Exercise)  0    Symptoms  None     Duration  Continue with 45 min of aerobic exercise without signs/symptoms of physical distress.    Intensity  THRR unchanged        Progression          Progression  Continue to progress workloads  to maintain intensity without signs/symptoms of physical distress.    Average METs  7.72        Resistance Training          Training Prescription  No        Interval Training          Interval Training  Yes    Equipment  Treadmill    Comments  Pt is jogging for 4 min. and walking 6 min.         Treadmill          MPH  6.5   walking: 3.7/3.5   Grade  0    Minutes  10   Walking: 6 min Jogging: 4 min.    METs  7.75        NuStep          Level  6    SPM  115    Minutes  10    METs  8.2        Elliptical          Level  4    Speed  2    Minutes  10    METs  7.2        Home Exercise Plan          Plans to continue exercise at  [facility] (comment)   Local Gym, Walking   Frequency  Add 2 additional days to program exercise sessions.    Initial Home Exercises Provided  07/11/18           Functional Capacity: 6 Minute Walk    6 Minute Walk    Row Name 06/23/18 1208 09/09/18 1229   Phase  Initial  Discharge   Distance  1652 feet  2155 feet   Distance % Change  no documentation  30.45 %   Distance Feet Change  no documentation  503 ft   Walk Time  6 minutes  6 minutes   # of Rest Breaks  0  0   MPH  3.13  4.08   METS  3.85  4.94   RPE  10  11   Perceived Dyspnea   0  no documentation   VO2 Peak  13.5  17.29   Symptoms  No  No   Resting HR  66 bpm  72 bpm   Resting BP  110/64  112/68   Resting Oxygen Saturation   98 %  no documentation   Exercise Oxygen Saturation  during 6 min  walk  99 %  no documentation   Max Ex. HR  95 bpm  108 bpm   Max Ex. BP  120/70  134/64   2 Minute Post BP  128/74  110/62          Psychological, QOL, Others - Outcomes: PHQ 2/9: Depression screen [facility] 2/9 09/26/2018 05/10/2018  Decreased Interest 0 0  Down, Depressed, Hopeless 0 0  PHQ - 2 Score 0 0    Quality of Life: Quality of Life - 09/21/18 1040    Quality of Life          Select  Quality of Life        Quality of Life Scores          Health/Function Post  28 %    Socioeconomic Post  30 %    Psych/Spiritual Post  30 %    Family Post  30 %    GLOBAL Post  29.12 %  Personal Goals: Goals established at orientation with interventions provided to work toward goal. Personal Goals and Risk Factors at Admission - 06/23/18 1332    Core Components/Risk Factors/Patient Goals on Admission          Intervention  Weight Management: Develop a combined nutrition and exercise program designed to reach desired caloric intake, while maintaining appropriate intake of nutrient and fiber, sodium and fats, and appropriate energy expenditure required for the weight goal.;Weight Management/Obesity: Establish reasonable short term and long term weight goals.;Weight Management: Provide education and appropriate resources to help participant work on and attain dietary goals.;Obesity: Provide education and appropriate resources to help participant work on and attain dietary goals.    Admit Weight  201 lb 4.5 oz (91.3 kg)    Goal Weight: Short Term  190 lb (86.2 kg)    Goal Weight: Long Term  185 lb (83.9 kg)    Expected Outcomes  Long Term: Adherence to nutrition and physical activity/exercise program aimed toward attainment of established weight goal;Short Term: Continue to assess and modify interventions until short term weight is achieved;Weight Loss: Understanding of general recommendations for a balanced deficit meal plan, which promotes 1-2 lb weight loss per week and includes a  negative energy balance of [phone] kcal/d;Understanding recommendations for meals to include 15-35% energy as protein, 25-35% energy from fat, 35-60% energy from carbohydrates, less than '200mg'$  of dietary cholesterol, 20-35 gm of total fiber daily;Understanding of distribution of calorie intake throughout the day with the consumption of 4-5 meals/snacks;Weight Gain: Understanding of general recommendations for a high calorie, high protein meal plan that promotes weight gain by distributing calorie intake throughout the day with the consumption for 4-5 meals, snacks, and/or supplements    Hypertension  Yes    Intervention  Provide education on lifestyle modifcations including regular physical activity/exercise, weight management, moderate sodium restriction and increased consumption of fresh fruit, vegetables, and low fat dairy, alcohol moderation, and smoking cessation.;Monitor prescription use compliance.    Expected Outcomes  Short Term: Continued assessment and intervention until BP is < 140/48m HG in hypertensive participants. < 130/815mHG in hypertensive participants with diabetes, heart failure or chronic kidney disease.;Long Term: Maintenance of blood pressure at goal levels.    Lipids  Yes    Intervention  Provide education and support for participant on nutrition & aerobic/resistive exercise along with prescribed medications to achieve LDL '70mg'$ , HDL >'40mg'$ .    Expected Outcomes  Short Term: Participant states understanding of desired cholesterol values and is compliant with medications prescribed. Participant is following exercise prescription and nutrition guidelines.;Long Term: Cholesterol controlled with medications as prescribed, with individualized exercise RX and with personalized nutrition plan. Value goals: LDL < '70mg'$ , HDL > 40 mg.            Personal Goals Discharge: Goals and Risk Factor Review    Core Components/Risk Factors/Patient Goals Review    Row Name 06/29/18 1129 07/20/18  1117 08/17/18 1118 08/22/18 0734 09/07/18 0921   Personal Goals Review  Weight Management/Obesity;Hypertension;Lipids  Weight Management/Obesity;Hypertension;Lipids  Weight Management/Obesity;Hypertension;Lipids  Weight Management/Obesity;Hypertension;Lipids  Weight Management/Obesity;Hypertension;Lipids   Review  pt with significant CAD RF demonstrates eagerness to participate in CR program. pt personal goal is to lose weight and complete CR program to gain insight and guidance to make healthy changes.    pt with significant CAD RF demonstrates eagerness to participate in CR program. pt personal goal is to lose weight and complete CR program to gain insight and guidance to make healthy changes.  pt demonstrates significant CR workload progression  and enjoys using elliptical.   pt with significant CAD RF demonstrates eagerness to participate in CR program. pt personal goal is to lose weight and complete CR program to gain insight and guidance to make healthy changes.  pt demonstrates significant CR workload progression  and enjoys using elliptical.   pt with significant CAD RF demonstrates eagerness to participate in CR program. pt personal goal is to lose weight and complete CR program to gain insight and guidance to make healthy changes.  pt demonstrates significant CR workload progression  and enjoys using elliptical. pt verbalizes overall increased strength/stamina, especially on Nustep.   pt with significant CAD RF demonstrates eagerness to participate in CR program. pt personal goal is to lose weight and complete CR program to gain insight and guidance to make healthy changes.  pt demonstrates significant CR workload progression  and enjoys using elliptical. pt verbalizes overall increased strength/stamina, especially on Nustep.    Expected Outcomes  pt will participate in CR exercise, nutrition and lifestyle modification to decrease overall CAD RF.    pt will participate in CR exercise, nutrition and  lifestyle modification to decrease overall CAD RF.    pt will participate in CR exercise, nutrition and lifestyle modification to decrease overall CAD RF.    pt will participate in CR exercise, nutrition and lifestyle modification to decrease overall CAD RF.    pt will participate in CR exercise, nutrition and lifestyle modification to decrease overall CAD RF.         Core Components/Risk Factors/Patient Goals Review    Row Name 09/26/18 0913   Personal Goals Review  Weight Management/Obesity;Hypertension;Lipids   Review  Rob has graduated from [employer] with 36 complete sessions. He feels that he has gained an exercise routine by participating in CR.   Expected Outcomes  Pt will continue to participate in exercise, nutrition, and lifestyle modification opportunities. Rob plans to work out at [employer] doing a similar routine 3-5 days a week.           Exercise Goals and Review: Exercise Goals    Exercise Goals    Row Name 06/23/18 1209   Increase Physical Activity  Yes   Intervention  Provide advice, education, support and counseling about physical activity/exercise needs.;Develop an individualized exercise prescription for aerobic and resistive training based on initial evaluation findings, risk stratification, comorbidities and participant's personal goals.   Expected Outcomes  Short Term: Attend rehab on a regular basis to increase amount of physical activity.;Long Term: Add in home exercise to make exercise part of routine and to increase amount of physical activity.;Long Term: Exercising regularly at least 3-5 days a week.   Increase Strength and Stamina  Yes   Intervention  Provide advice, education, support and counseling about physical activity/exercise needs.;Develop an individualized exercise prescription for aerobic and resistive training based on initial evaluation findings, risk stratification, comorbidities and participant's personal goals.   Expected Outcomes  Short Term:  Increase workloads from initial exercise prescription for resistance, speed, and METs.;Long Term: Improve cardiorespiratory fitness, muscular endurance and strength as measured by increased METs and functional capacity (6MWT);Short Term: Perform resistance training exercises routinely during rehab and add in resistance training at home   Able to understand and use rate of perceived exertion (RPE) scale  Yes   Intervention  Provide education and explanation on how to use RPE scale   Expected Outcomes  Short Term: Able to use RPE daily in  rehab to express subjective intensity level;Long Term:  Able to use RPE to guide intensity level when exercising independently   Knowledge and understanding of Target Heart Rate Range (THRR)  Yes   Intervention  Provide education and explanation of THRR including how the numbers were predicted and where they are located for reference   Expected Outcomes  Short Term: Able to state/look up THRR;Long Term: Able to use THRR to govern intensity when exercising independently;Short Term: Able to use daily as guideline for intensity in rehab   Able to check pulse independently  Yes   Intervention  Provide education and demonstration on how to check pulse in carotid and radial arteries.;Review the importance of being able to check your own pulse for safety during independent exercise   Expected Outcomes  Short Term: Able to explain why pulse checking is important during independent exercise;Long Term: Able to check pulse independently and accurately   Intervention  Provide education, explanation, and written materials on patient's individual exercise prescription   Expected Outcomes  Short Term: Able to explain program exercise prescription;Long Term: Able to explain home exercise prescription to exercise independently          Exercise Goals Re-Evaluation: Exercise Goals Re-Evaluation    Exercise Goal Re-Evaluation    Row Name 07/11/18 1515 08/10/18 1638 08/24/18 1434  09/14/18 0929 09/26/18 1602   Exercise Goals Review  Increase Physical Activity;Able to understand and use rate of perceived exertion (RPE) scale;Knowledge and understanding of Target Heart Rate Range (THRR);Understanding of Exercise Prescription;Increase Strength and Stamina;Able to check pulse independently  Increase Physical Activity;Able to understand and use rate of perceived exertion (RPE) scale;Understanding of Exercise Prescription;Knowledge and understanding of Target Heart Rate Range (THRR);Increase Strength and Stamina;Able to check pulse independently  Increase Physical Activity;Increase Strength and Stamina;Able to check pulse independently;Understanding of Exercise Prescription;Knowledge and understanding of Target Heart Rate Range (THRR);Able to understand and use rate of perceived exertion (RPE) scale  Increase Physical Activity;Increase Strength and Stamina;Able to check pulse independently;Understanding of Exercise Prescription;Knowledge and understanding of Target Heart Rate Range (THRR);Able to understand and use rate of perceived exertion (RPE) scale  Increase Physical Activity;Able to understand and use rate of perceived exertion (RPE) scale;Knowledge and understanding of Target Heart Rate Range (THRR);Understanding of Exercise Prescription;Increase Strength and Stamina;Able to check pulse independently   Comments  Reviewed HEP with pt. Also reviewed THRR, RPE Scale, weather precautions, endpoints of exericse, NTG use, warmup and cool down. Explained to pt the difference between physical activity and exercise. Pt golfs alot, but does not exercise.  Patient will continue walking and biking in addition to CR exercise.   Reviewed METs and goals with Pt. Pt continues to progress workloads. Pt is tolerating exercise well.   Reviewed METs and goals with Pt. Pt continues to progress workloads and has a MET level of 8.0. Pt is exercising at a local gym for 60 minutes 2 days per week in addition to  cardiac rehab.   Pt graduated Cardiac Rehab today. Will continue exercising at local gym on his own. Pt is knowledgable about equipment, HITT, and exercise precautions.    Expected Outcomes  Pt will plan to walk for exercise 2-3 days a week, 30-45 minutes. Pt plans to start going to gym near home. Will continue to follow up with pt.   Will continue to monitor and progress Pt as tolerated.   Will continue to monitor and progress Pt as tolerated.   Will continue to monitor and progress Pt as  tolertaed.   Pt will continue exercising on his own at local gym.           Nutrition & Weight - Outcomes: Pre Biometrics - 06/23/18 1210    Pre Biometrics          Height  5' 8.5" (1.74 m)    Weight  91.3 kg    Waist Circumference  39 inches    Hip Circumference  41 inches    Waist to Hip Ratio  0.95 %    BMI (Calculated)  30.16    Triceps Skinfold  12 mm    % Body Fat  26.7 %    Grip Strength  40 kg    Flexibility  15 in    Single Leg Stand  30 seconds          Post Biometrics - 09/09/18 1231     Post  Biometrics          Height  5' 8.5" (1.74 m)    Weight  92.2 kg    Waist Circumference  40 inches    Hip Circumference  42 inches    Waist to Hip Ratio  0.95 %    BMI (Calculated)  30.45    Triceps Skinfold  15 mm    % Body Fat  28.2 %    Grip Strength  37 kg    Flexibility  13 in    Single Leg Stand  35 seconds           Nutrition: Nutrition Therapy & Goals - 09/30/18 0910    Nutrition Therapy          Diet  heart healthy carb modified        Personal Nutrition Goals          Nutrition Goal  Pt to identify and limit food sources of saturated fat, trans fat, refined carbohydrates and sodium    Personal Goal #2  Pt to identify food quantities necessary to achieve weight loss of 6-24 lbs. at graduation from cardiac rehab. Goal wt of 185 lb desired.     Personal Goal #3  Pt able to name foods that affect blood glucose.        Intervention Plan          Intervention   Prescribe, educate and counsel regarding individualized specific dietary modifications aiming towards targeted core components such as weight, hypertension, lipid management, diabetes, heart failure and other comorbidities.    Expected Outcomes  Short Term Goal: Understand basic principles of dietary content, such as calories, fat, sodium, cholesterol and nutrients.;Long Term Goal: Adherence to prescribed nutrition plan.           Nutrition Discharge: Nutrition Assessments - 09/30/18 0910    MEDFICTS Scores          Pre Score  25    Post Score  12    Score Difference  -13           Education Questionnaire Score: Knowledge Questionnaire Score - 09/21/18 0926    Knowledge Questionnaire Score          Post Score  23/24           Goals reviewed with patient; copy given to patient.

## 2018-10-12 NOTE — Addendum Note (Signed)
Encounter addended by: Robyne Peers, RN on: 10/12/2018 2:39 PM  Actions taken: Clinical Note Signed

## 2018-10-12 NOTE — Addendum Note (Signed)
Encounter addended by: Robyne Peers, RN on: 10/12/2018 2:40 PM  Actions taken: Clinical Note Signed

## 2018-10-12 NOTE — Addendum Note (Signed)
Encounter addended by: Robyne Peers, RN on: 10/12/2018 2:47 PM  Actions taken: Episode resolved

## 2018-10-20 ENCOUNTER — Other Ambulatory Visit: Payer: Self-pay | Admitting: Adult Health

## 2018-10-29 DIAGNOSIS — G4733 Obstructive sleep apnea (adult) (pediatric): Secondary | ICD-10-CM | POA: Diagnosis not present

## 2018-11-05 NOTE — Patient Instructions (Signed)
Take Zithromax Z-PAK as directed.  Rest and drink plenty of fluids.  Continue current medications.  Follow-up July 2020 for physical exam.  Continue diet and exercise program.

## 2018-11-10 ENCOUNTER — Ambulatory Visit (INDEPENDENT_AMBULATORY_CARE_PROVIDER_SITE_OTHER): Payer: 59 | Admitting: Cardiovascular Disease

## 2018-11-10 ENCOUNTER — Encounter: Payer: Self-pay | Admitting: Cardiovascular Disease

## 2018-11-10 VITALS — BP 104/64 | HR 59 | Ht 69.0 in | Wt 201.0 lb

## 2018-11-10 DIAGNOSIS — G4733 Obstructive sleep apnea (adult) (pediatric): Secondary | ICD-10-CM | POA: Diagnosis not present

## 2018-11-10 DIAGNOSIS — I2510 Atherosclerotic heart disease of native coronary artery without angina pectoris: Secondary | ICD-10-CM

## 2018-11-10 DIAGNOSIS — I214 Non-ST elevation (NSTEMI) myocardial infarction: Secondary | ICD-10-CM | POA: Diagnosis not present

## 2018-11-10 DIAGNOSIS — Z951 Presence of aortocoronary bypass graft: Secondary | ICD-10-CM

## 2018-11-10 DIAGNOSIS — I10 Essential (primary) hypertension: Secondary | ICD-10-CM | POA: Diagnosis not present

## 2018-11-10 DIAGNOSIS — E785 Hyperlipidemia, unspecified: Secondary | ICD-10-CM

## 2018-11-10 DIAGNOSIS — E118 Type 2 diabetes mellitus with unspecified complications: Secondary | ICD-10-CM

## 2018-11-10 NOTE — Patient Instructions (Signed)
Medication Instructions:  The current medical regimen is effective;  continue present plan and medications.  If you need a refill on your cardiac medications before your next appointment, please call your pharmacy.   Follow-Up: At [facility], you and your health needs are our priority.  As part of our continuing mission to provide you with exceptional heart care, we have created designated Provider Care Teams.  These Care Teams include your primary Cardiologist (physician) and Advanced Practice Providers (APPs -  Physician Assistants and Nurse Practitioners) who all work together to provide you with the care you need, when you need it. You will need a follow up appointment in 3-4 months.  Please call our office 2 months in advance to schedule this appointment.  You may see Dr.Kelly or one of the following Advanced Practice Providers on your designated Care Team: Azalee Course, [place] . Micah Flesher, PA-C

## 2018-11-12 ENCOUNTER — Other Ambulatory Visit: Payer: Self-pay | Admitting: Cardiology

## 2018-11-12 ENCOUNTER — Other Ambulatory Visit: Payer: Self-pay | Admitting: Adult Health

## 2018-11-14 NOTE — Telephone Encounter (Signed)
Rx has been sent to the pharmacy electronically. ° °

## 2018-11-16 ENCOUNTER — Encounter: Payer: Self-pay | Admitting: Cardiovascular Disease

## 2018-11-16 NOTE — Progress Notes (Signed)
Cardiology Office Note    Date:  11/16/2018   ID:  Zachary [name], DOB 06/04/1961, MRN [id number]  PCP:  Elby Showers, MD  Cardiologist:  Shelva Majestic, MD (slerep); Dr. Stanford Breed  New sleep evaluation  History of Present Illness:  Zachary [name] is a 58 y.o. male followed by Dr. Stanford Breed for his cardiology care.  He presents for his initial sleep evaluation following being diagnosed with sleep apnea and initiating CPAP therapy.  Zachary [name] has a history of ED and suffered a NSTEMI in 2019.  He was found to have severe three-vessel coronary artery disease when CABG revascularization surgery on April 25, 2018 with a LIMA to his LAD, saphenous vein graft to his PDA and saphenous vein graft to the circumflex.  He developed postoperative atrial fibrillation.  Incidentally he was found to have a 6 mm right major fissure nodule CT imaging.  Echo Doppler study had shown normal LV function, mild diastolic dysfunction, mild to moderate MR, moderate biatrial enlargement with mild to moderate tricuspid regurgitation.  He is followed by Dr. Emeline General for primary care.  Went a sleep study due to concerns for obstructive sleep apnea initial evaluation was a home study on this 12/2017.  This revealed moderate overall sleep apnea with an AHI of 22.3 but he had very severe positional sleep apnea with supine sleep AHI at 78.6/h.  Oxygen nadir was 83%.  There was a home study rem sleep could not be assessed.  I want a CPAP titration trial June 24, 2018 and was titrated up to 15 cm water pressure.  At 15 cm AHI was 0 and oxygen nadir 92%.  Did experience central sleep apnea during his titration with a central apnea index of 21.2.  Central events were maximal at 10 to 12 cm and seem to improve a slightly higher pressure.  He ws initially started on therapy therapy with potential need for BiPAP titration if continued central events developed.  His set up date was October 03, 2018.  [employer] is his DME company.   Download was obtained from October 10, 2018 through November 08, 2018.  Usage days was only 57% of days and usage greater than 4 hours was only 2 days.  He was only using therapy for 2 hours and 16 minutes.  His minimum pressures was 8 with a maximum pressure of 16.  His 95th percentile pressure was 10.8 with a maximum average pressure of 11.9.  He initially was started with an air fit N 30 on a medium size mask.  The reason he was not using the machine for adequate duration was that the pressures achieved seem to be high which were keeping him awake.  At present, he goes to bed at 10 PM and wakes up at 7 PM.  He states that he has not been sleeping well often is up particularly for a large portion of the night.  However, he denies residual daytime sleepiness.  An Epworth Sleepiness Scale score was calculated in the office today and this endorsed at 3.  He denies any sleep paralysis, bruxism, or cataplectic events.  Typically he has nocturia at least 2 times per night.  He presents for his initial sleep evaluation.   Past Medical History:  Diagnosis Date  . Hyperlipemia     Past Surgical History:  Procedure Laterality Date  . CORONARY ARTERY BYPASS GRAFT N/A 04/23/2018   Procedure: CORONARY ARTERY BYPASS GRAFTING (CABG) x3 using the right greater saphenous  vein harvested endoscopically and the left internal mammary artery. LIMA to LAD, SVG to Distal Circ, SVG to PD;  Surgeon: Grace Isaac, MD;  Location: [facility];  Service: Open Heart Surgery;  Laterality: N/A;  . LEFT HEART CATH AND CORONARY ANGIOGRAPHY N/A 04/21/2018   Procedure: LEFT HEART CATH AND CORONARY ANGIOGRAPHY;  Surgeon: Lorretta Harp, MD;  Location: [facility];  Service: Cardiovascular;  Laterality: N/A;  . TEE WITHOUT CARDIOVERSION N/A 04/23/2018   Procedure: TRANSESOPHAGEAL ECHOCARDIOGRAM (TEE);  Surgeon: Grace Isaac, MD;  Location: [facility];  Service: Open Heart Surgery;  Laterality: N/A;    Current  Medications: Outpatient Medications Prior to Visit  Medication Sig Dispense Refill  . acetaminophen (TYLENOL) 325 MG tablet Take 325 mg by mouth every 6 (six) hours as needed.    Marland Kitchen aspirin EC 81 MG tablet Take 81 mg by mouth daily.    Marland Kitchen azithromycin (ZITHROMAX) 250 MG tablet 2 po day 1 followed by one po days 2-5 6 tablet 0  . metFORMIN (GLUCOPHAGE) 500 MG tablet One po daily with a meal 90 tablet 1  . metoprolol tartrate (LOPRESSOR) 25 MG tablet TAKE 0.5 TABLETS (12.5 MG TOTAL) BY MOUTH 2 (TWO) TIMES DAILY. 90 tablet 1  . atorvastatin (LIPITOR) 80 MG tablet TAKE 1 TABLET BY MOUTH DAILY AT 6 PM. 30 tablet 1  . lisinopril (PRINIVIL,ZESTRIL) 2.5 MG tablet TAKE 1 TABLET BY MOUTH EVERY DAY 30 tablet 1   No facility-administered medications prior to visit.      Allergies:   Patient has no known allergies.   Social History   Socioeconomic History  . Marital status: Married    Spouse name: Not on file  . Number of children: Not on file  . Years of education: Not on file  . Highest education level: Not on file  Occupational History  . Not on file  Social Needs  . Financial resource strain: Not on file  . Food insecurity:    Worry: Not on file    Inability: Not on file  . Transportation needs:    Medical: Not on file    Non-medical: Not on file  Tobacco Use  . Smoking status: Former Smoker    Types: Cigarettes    Start date: 10/26/1982    Last attempt to quit: 04/20/2018    Years since quitting: 0.5  . Smokeless tobacco: Never Used  . Tobacco comment: smoked about 20 cigarrets every 2 days.   Substance and Sexual Activity  . Alcohol use: Yes    Comment: socially   . Drug use: Never  . Sexual activity: Yes  Lifestyle  . Physical activity:    Days per week: Not on file    Minutes per session: Not on file  . Stress: Not on file  Relationships  . Social connections:    Talks on phone: Not on file    Gets together: Not on file    Attends religious service: Not on file     Active member of club or organization: Not on file    Attends meetings of clubs or organizations: Not on file    Relationship status: Not on file  Other Topics Concern  . Not on file  Social History Narrative  . Not on file    Additional social history is notable in that he is married.  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  He has 2 children ages 29 and 11.  Family History:  The patient's  family history includes Aortic stenosis in his brother; CAD in his brother and father; Stroke in his father.  His father is 63 had a history of CVA.  His mother is 26.  His brother has obstructive sleep apnea and underwent 2 stents and is 60 years old.  ROS General: Negative; No fevers, chills, or night sweats;  HEENT: Negative; No changes in vision or hearing, sinus congestion, difficulty swallowing Pulmonary: Negative; No cough, wheezing, shortness of breath, hemoptysis Cardiovascular: see HPI GI: Negative; No nausea, vomiting, diarrhea, or abdominal pain GU: Negative; No dysuria, hematuria, or difficulty voiding Musculoskeletal: Negative; no myalgias, joint pain, or weakness Hematologic/Oncology: Negative; no easy bruising, bleeding Endocrine: Negative; no heat/cold intolerance; no diabetes Neuro: Negative; no changes in balance, headaches Skin: Negative; No rashes or skin lesions Psychiatric: Negative; No behavioral problems, depression Sleep: see HPI Other comprehensive 14 point system review is negative.   PHYSICAL EXAM:   VS:  BP 104/64 (BP Location: Left Arm, Patient Position: Sitting, Cuff Size: Normal)   Pulse (!) 59   Ht _0  (1.753 m)   Wt 201 lb (91.2 kg)   BMI 29.68 kg/m     Blood pressure by me was 114/70  Wt Readings from Last 3 Encounters:  11/10/18 201 lb (91.2 kg)  10/06/18 205 lb (93 kg)  09/09/18 203 lb 4.2 oz (92.2 kg)    General: Alert, oriented, no distress.  Skin: normal turgor, no rashes, warm and dry HEENT: Normocephalic, atraumatic. Pupils equal round  and reactive to light; sclera anicteric; extraocular muscles intact; Fundi without hemorrhages or exudates.  Discs flat Nose without nasal septal hypertrophy Mouth/Parynx benign; Mallinpatti scale 3 Neck: No JVD, no carotid bruits; normal carotid upstroke Lungs: clear to ausculatation and percussion; no wheezing or rales Chest wall: without tenderness to palpitation Heart: PMI not displaced, RRR, s1 s2 normal, 1/6 systolic murmur, no diastolic murmur, no rubs, gallops, thrills, or heaves Abdomen: soft, nontender; no hepatosplenomehaly, BS+; abdominal aorta nontender and not dilated by palpation. Back: no CVA tenderness Pulses 2+ Musculoskeletal: full range of motion, normal strength, no joint deformities Extremities: no clubbing cyanosis or edema, Homan's sign negative  Neurologic: grossly nonfocal; Cranial nerves grossly wnl Psychologic: Normal mood and affect   Studies/Labs Reviewed:   EKG:  EKG is ordered today.  ECG (independently read by me): Sinus bradycardia 59 bpm.  No ectopy.  Normal intervals.  Recent Labs: BMP Latest Ref Rng & Units 05/10/2018 04/27/2018 04/26/2018  Glucose 65 - 139 mg/dL 119 146(H) 142(H)  BUN 7 - 25 mg/dL _1 Creatinine 0.70 - 1.33 mg/dL 1.16 0.92 0.91  BUN/Creat Ratio 6 - 22 (calc) NOT APPLICABLE - -  Sodium 025 - 146 mmol/L 136 136 134(L)  Potassium 3.5 - 5.3 mmol/L 5.3 3.8 4.4  Chloride 98 - 110 mmol/L 98 100 98  CO2 20 - 32 mmol/L _2 Calcium 8.6 - 10.3 mg/dL 10.0 8.8(L) 8.5(L)     Hepatic Function Latest Ref Rng & Units 06/30/2018 05/10/2018 04/23/2018  Total Protein 6.1 - 8.1 g/dL 6.2 6.7 6.4(L)  Albumin 3.5 - 5.0 g/dL - - 3.7  AST 10 - 35 U/L _3 ALT 9 - 46 U/L _4 Alk Phosphatase 38 - 126 U/L - - 53  Total Bilirubin 0.2 - 1.2 mg/dL 0.8 0.5 0.9  Bilirubin, Direct 0.0 - 0.2 mg/dL 0.2 - -    CBC Latest Ref Rng & Units 10/06/2018 06/30/2018 05/23/2018  WBC  3.8 - 10.8 Thousand/uL 11.5(H) 6.8 9.1  Hemoglobin 13.2 - 17.1 g/dL 13.5  12.8(L) 12.1(L)  Hematocrit 38.5 - 50.0 % 40.3 38.3(L) 34.9(L)  Platelets 140 - 400 Thousand/uL 354 299 402(H)   Lab Results  Component Value Date   MCV 86.7 10/06/2018   MCV 86.1 06/30/2018   MCV 84.5 05/23/2018   Lab Results  Component Value Date   TSH 3.66 05/10/2018   Lab Results  Component Value Date   HGBA1C 6.1 (H) 10/06/2018     BNP No results found for: BNP  ProBNP No results found for: PROBNP   Lipid Panel     Component Value Date/Time   CHOL 123 06/30/2018 0950   TRIG 67 06/30/2018 0950   HDL 44 06/30/2018 0950   CHOLHDL 2.8 06/30/2018 0950   VLDL 17 04/21/2018 0235   LDLCALC 65 06/30/2018 0950     RADIOLOGY: No results found.   Additional studies/ records that were reviewed today include:  I reviewed the patient's hospitalization, office note with Dr. Stanford Breed as well as with Dr. Annette Stable.  His home study as well as CPAP titration study were extensively reviewed and discussed with him.  I obtained a download as well as calculated Epworth Sleepiness Scale score.  ASSESSMENT:    1. OSA (obstructive sleep apnea)   2. Essential hypertension   3. NSTEMI (non-ST elevated myocardial infarction) ([place])   4. Coronary artery disease involving native coronary artery of native heart without angina pectoris   5. Hx of CABG   6. Hyperlipidemia with target LDL less than 70   7. Type 2 diabetes mellitus with complication, without long-term current use of insulin [facility])     PLAN:  Zachary [name] is a 58 year old gentleman who has significant cardiovascular comorbidities and had suffered a non-ST segment elevation myocardial infarction in June 2019 at which time he was found to have severe multivessel CAD requiring CABG revascularization.  He had developed postoperative atrial fibrillation treated with amiodarone.  His initials home sleep study is positive for sleep apnea which was at least moderate on the overall study; however, on the home study rem  sleep cannot be assessed and as result he does not know the severity of his sleep apnea during rem sleep.  However, he had very severe sleep apnea with supine posture.  I thoroughly reviewed his home study as well as his CPAP titration study with both he and his wife.  On his CPAP titration, central events were noted at lower pressures which seem to improve at his felt to be optimal pressure of 15.  Because of his positional component he was initially recommended to start his CPAP auto and over the past month has been on an EPAP minimum of 8 with a maximum of 16.  Unfortunately, he is not meeting compliance standards.  He states that the pressure was elevated.  I discussed with him that he has an auto unit and the pressure will adjust based on his needs and if his CPAP pressure had increased it was most likely due to his need for increased therapy.  In addition he did have a significant supine positional component.  Presently I have recommended an adjustment to his mask and have recommended a Respironics DreamWear full facemask medium with which may be helpful with potential issues that he had had with his air fit N 30i mask.  I discussed the severity of his sleep apnea and the importance of meeting compliance particularly with reference  to his cardiovascular risk.  He had developed atrial fibrillation postoperatively and I discussed increased risk for recurrent AF if his sleep apnea is uncontrolled.  His ECG today confirms maintenance of sinus rhythm with sinus bradycardia at 59 bpm.  His blood pressure is stable on low-dose lisinopril 2.5 mg in addition to his metoprolol 12.5 mg twice a day.  He has type 2 diabetes mellitus on metformin 500 mg daily.  He is on aggressive lipid-lowering therapy with atorvastatin 80 mg with target LDL less than 70 in this patient with established CAD.  Since he has complained of high pressures, I will slightly reduce his auto pressure and he will start at a minimum of 8 but increase  to 13.  A repeat download will be obtained in 1 month for reassessment.  Since he is not compliant I will need to see him within the next 2 to 3 months for reevaluation.  I discussed with him the importance of meeting compliance within 90 days.  Medication Adjustments/Labs and Tests Ordered: Current medicines are reviewed at length with the patient today.  Concerns regarding medicines are outlined above.  Medication changes, Labs and Tests ordered today are listed in the Patient Instructions below. Patient Instructions  Medication Instructions:  The current medical regimen is effective;  continue present plan and medications.  If you need a refill on your cardiac medications before your next appointment, please call your pharmacy.   Follow-Up: At [facility], you and your health needs are our priority.  As part of our continuing mission to provide you with exceptional heart care, we have created designated Provider Care Teams.  These Care Teams include your primary Cardiologist (physician) and Advanced Practice Providers (APPs -  Physician Assistants and Nurse Practitioners) who all work together to provide you with the care you need, when you need it. You will need a follow up appointment in 3-4 months.  Please call our office 2 months in advance to schedule this appointment.  You may see Dr.Meckenzie Balsley or one of the following Advanced Practice Providers on your designated Care Team: Almyra Deforest, [place] . Fabian Sharp, PA-C        Signed, Shelva Majestic, MD  11/16/2018 12:02 PM    [facility] [address], [place], [place], [place]  [address] Phone: [phone]

## 2018-11-16 NOTE — Addendum Note (Signed)
Addended by: Nicki Guadalajara A on: 11/16/2018 12:04 PM   Modules accepted: Level of Service

## 2018-11-19 ENCOUNTER — Other Ambulatory Visit: Payer: Self-pay | Admitting: Internal Medicine

## 2018-11-29 DIAGNOSIS — G4733 Obstructive sleep apnea (adult) (pediatric): Secondary | ICD-10-CM | POA: Diagnosis not present

## 2018-12-06 ENCOUNTER — Ambulatory Visit (INDEPENDENT_AMBULATORY_CARE_PROVIDER_SITE_OTHER): Payer: 59 | Admitting: Internal Medicine

## 2018-12-06 ENCOUNTER — Encounter: Payer: Self-pay | Admitting: Internal Medicine

## 2018-12-06 VITALS — BP 140/70 | HR 66 | Temp 98.2°F | Ht 69.0 in | Wt 204.0 lb

## 2018-12-06 DIAGNOSIS — B029 Zoster without complications: Secondary | ICD-10-CM | POA: Diagnosis not present

## 2018-12-06 DIAGNOSIS — R911 Solitary pulmonary nodule: Secondary | ICD-10-CM | POA: Diagnosis not present

## 2018-12-06 MED ORDER — VALACYCLOVIR HCL 1 G PO TABS: 1000.0000 mg | ORAL_TABLET | Freq: Three times a day (TID) | ORAL | 0 refills | Status: DC

## 2018-12-06 NOTE — Patient Instructions (Signed)
Valtrex 1 g 3 times a day for 7 days for herpes zoster.  Arrange for noncontrast CT of the lung to evaluate right subpleural nodule.

## 2018-12-06 NOTE — Progress Notes (Signed)
   Subjective:    Patient ID: Zachary [name], male    DOB: 1961-10-14, 58 y.o.   MRN: [id number]  HPI Onset Sunday of rash right trunk.  Noticed it after playing golf.  It was itchy.  Has not improved.  Now has become irritated and tender.  He does have a history of chickenpox in childhood.  No fever or headache.  No nausea.    Review of Systems see above     Objective:   Physical Exam  Right trunk has 3 papular linear areas with tiny vesicles that seem to have ruptured.      Assessment & Plan:  Herpes zoster right trunk  Subpleural right pulmonary nodule  Plan: Valtrex 1 g 3 times a day for 7 days.  Apply calamine lotion topically 2 or 3 times daily until healed.  Addendum: He has a history of subpleural nodule 6 mm along right major fissure which was noted on CT with contrast when he was complaining of hypoxemia and cough in July.  Repeat CT was recommended in 6 to 12 months.  Patient is anxious to go ahead and have this repeat study done so it will be ordered.

## 2018-12-16 ENCOUNTER — Other Ambulatory Visit: Payer: Self-pay | Admitting: Internal Medicine

## 2018-12-16 IMAGING — CT CT ANGIO CHEST
1 of 2 series · 18 of 32 positions shown · IV contrast (APPLIED)
Comparison: None.

CLINICAL DATA: Hypoxemia, cough.  Recent CABG.  Pulmonary embolism?

EXAM:
CT ANGIOGRAPHY CHEST WITH CONTRAST
TECHNIQUE: Multidetector CT imaging of the chest was performed using the
standard protocol during bolus administration of intravenous
contrast. Multiplanar CT image reconstructions and MIPs were
obtained to evaluate the vascular anatomy.
CONTRAST:  75mL 8KH8KA-EGS IOPAMIDOL (8KH8KA-EGS) INJECTION 76%

[Series 5: thins 1.0 b31s · axial · 0.75mm/px · z∈[-380,-101]mm · 18 of 311 slices shown]
[im 16/311  lung]
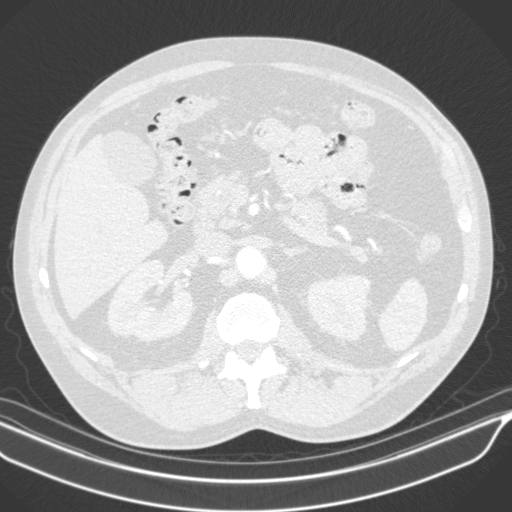
[im 32/311  mediastinal]
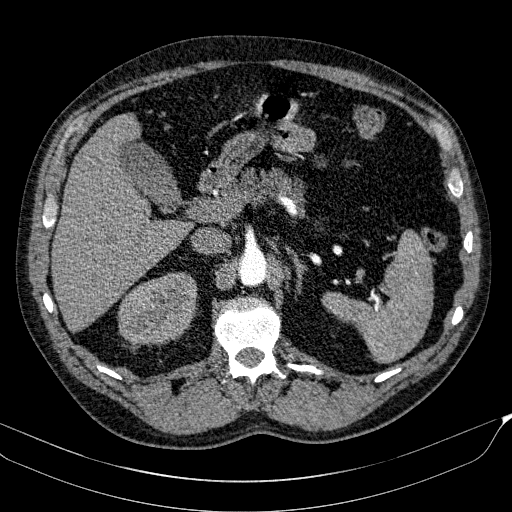
[im 63/311  lung]
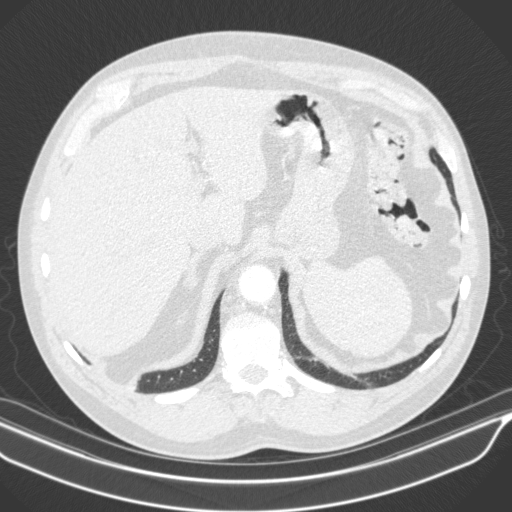
[im 78/311  mediastinal]
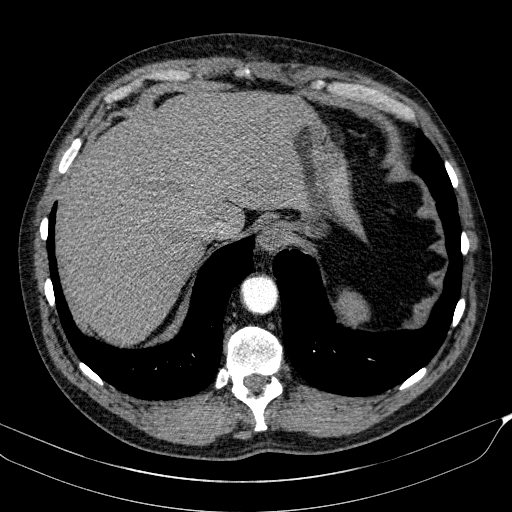
[im 94/311  lung]
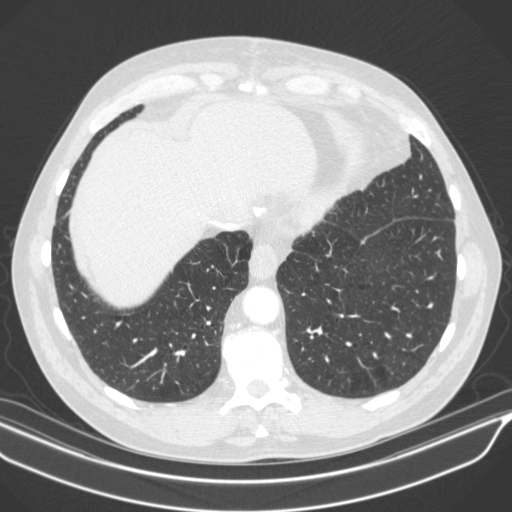
[im 104/311  mediastinal]
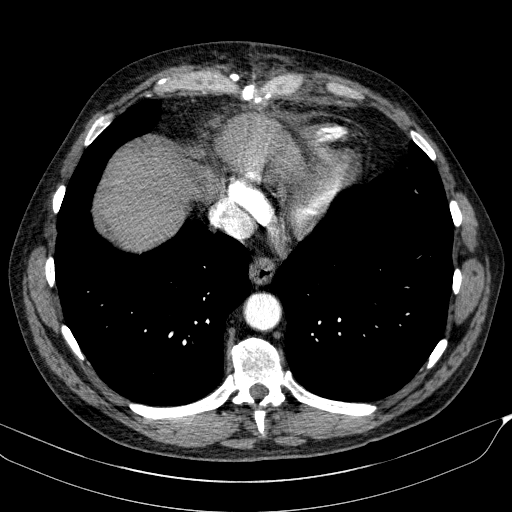
[im 109/311  lung]
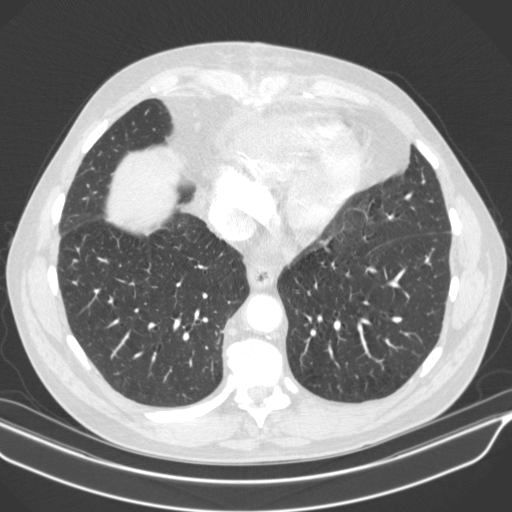
[im 140/311  mediastinal]
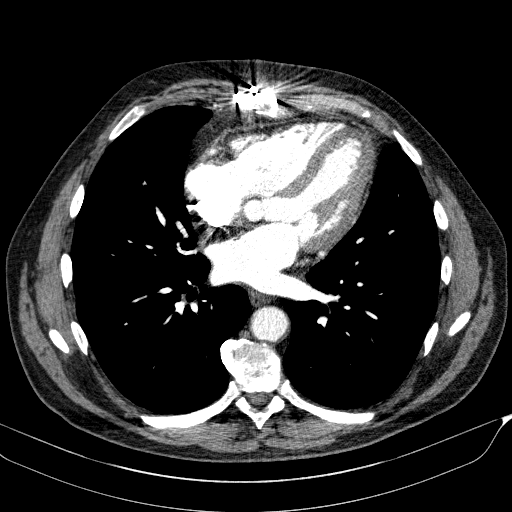
[im 146/311  lung]
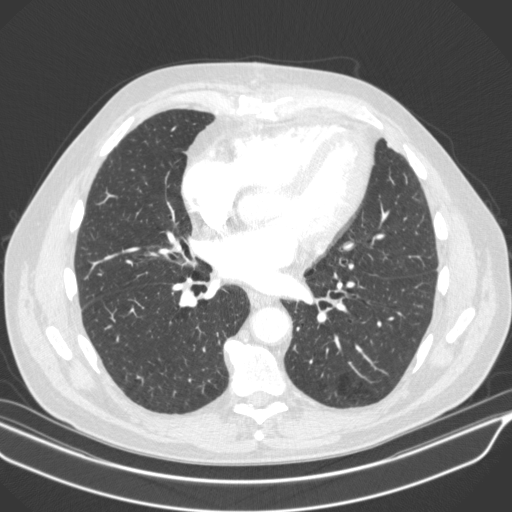
[im 156/311  mediastinal]
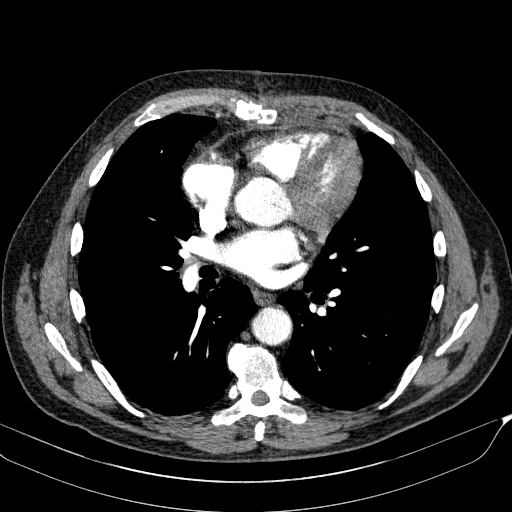
[im 171/311  lung]
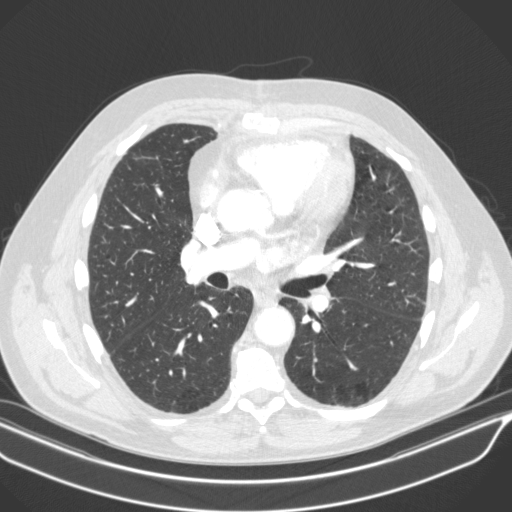
[im 202/311  mediastinal]
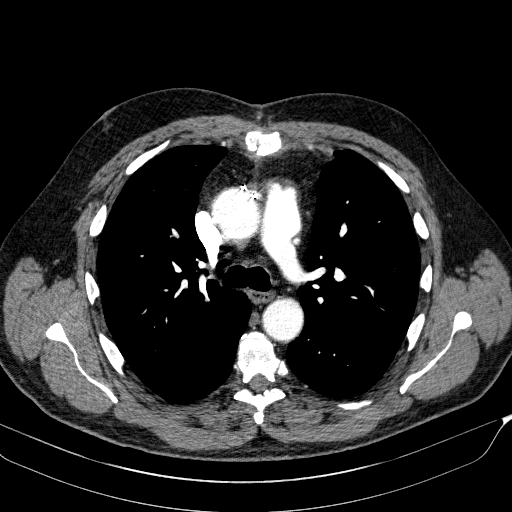
[im 207/311  lung]
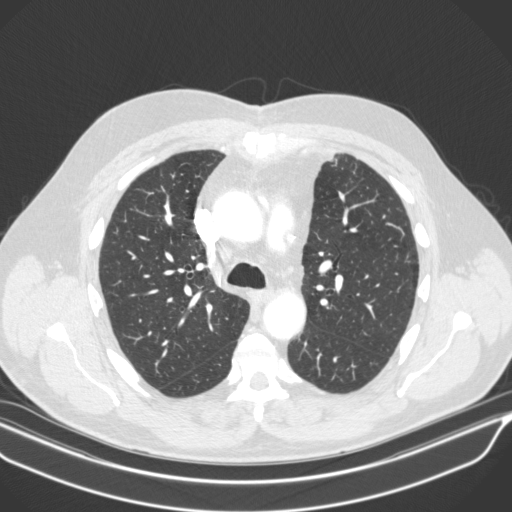
[im 218/311  mediastinal]
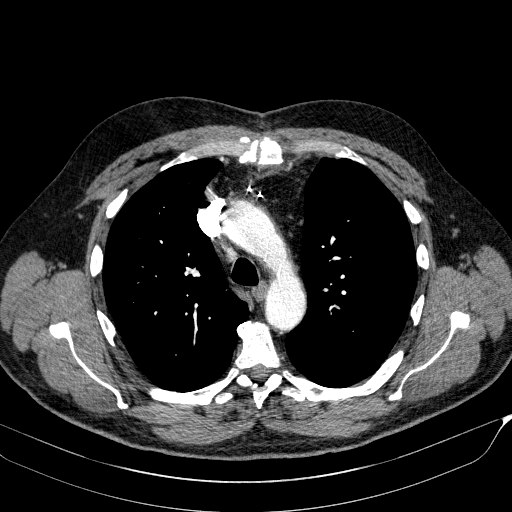
[im 233/311  lung]
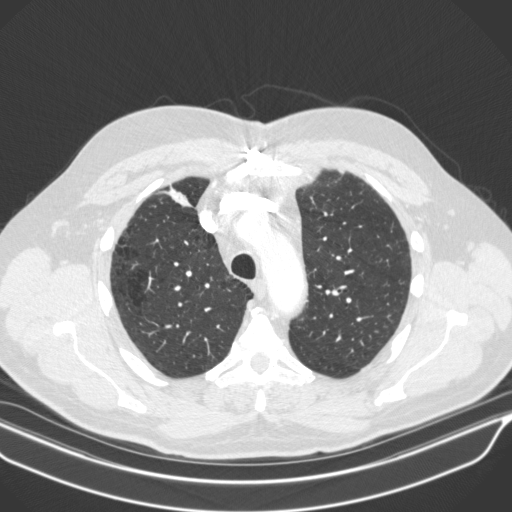
[im 249/311  mediastinal]
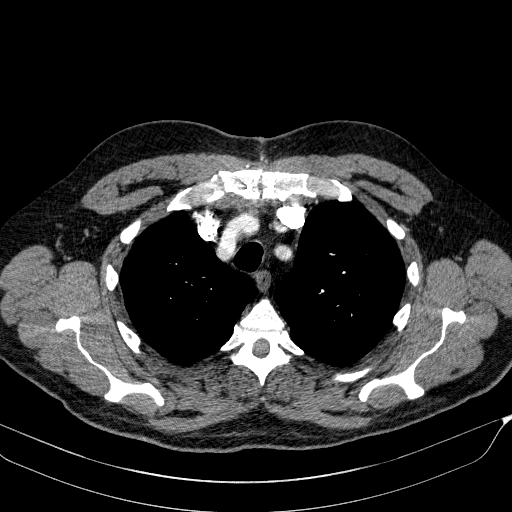
[im 280/311  lung]
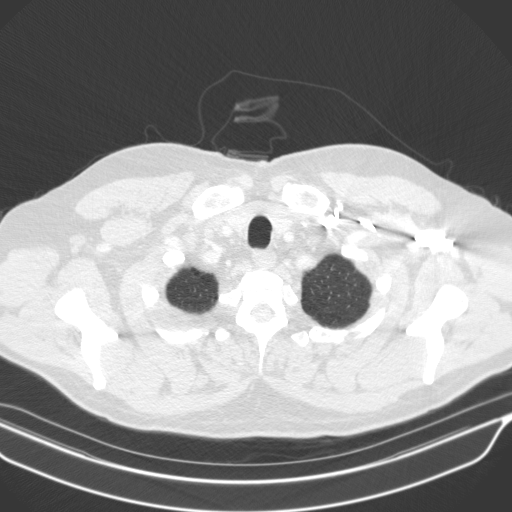
[im 295/311  mediastinal]
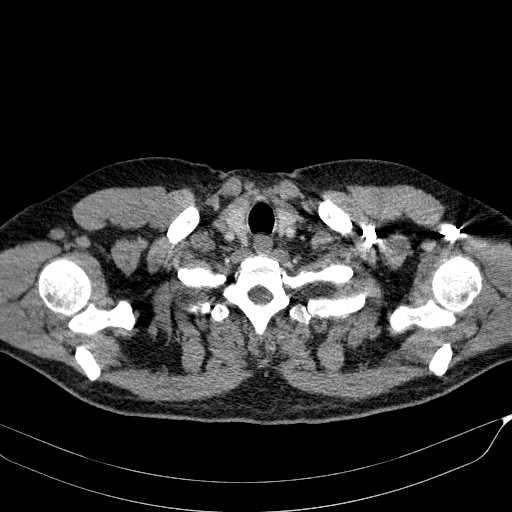

[18 of 32 positions shown; findings below may reference images not displayed]

FINDINGS: Cardiovascular: There is no pulmonary embolism identified within the
main, lobar or segmental pulmonary arteries bilaterally.

Surgical changes of CABG. No pericardial effusion. No thoracic
aortic aneurysm or evidence of aortic dissection.

Mediastinum/Nodes: No hemorrhage or significant edema within the
mediastinum. No mass or enlarged lymph nodes within the mediastinum
or perihilar regions. Esophagus appears normal. Trachea and central
bronchi are unremarkable.

Lungs/Pleura: Biapical emphysematous change, mild to moderate in
degree. Chronic scarring within the RIGHT upper lung. Subpleural
nodule along the RIGHT major fissure measures 6 mm (series 7, image
76), suspected additional nodular scarring.

No pneumonia or pulmonary edema. No pleural effusion or
pneumothorax.

Upper Abdomen: No acute abnormality.

Musculoskeletal: Mild degenerative spondylosis of the thoracic spine
with associated kyphosis. No acute or suspicious osseous finding.

Review of the MIP images confirms the above findings.
IMPRESSION: 1. No acute findings. No pulmonary embolism. No pneumonia or
pulmonary edema. No pericardial effusion. No evidence of surgical
complicating feature related to the given history of recent CABG.
2. Biapical emphysematous change, mild to moderate in degree.
3. Subpleural nodule along the RIGHT major fissure, measuring 6 mm,
suspected nodular scarring or other benign etiology. Non-contrast
chest CT at 6-12 months is recommended. If the nodule is stable at
time of repeat CT, then future CT at 18-24 months (from today's
scan) is considered optional for low-risk patients, but is
recommended for high-risk patients. This recommendation follows the
consensus statement: Guidelines for Management of Incidental
Pulmonary Nodules Detected on CT Images: From the [HOSPITAL]

Emphysema (FW3MU-R4B.E).

## 2018-12-16 NOTE — Addendum Note (Signed)
Addended by: Gregery Na on: 12/16/2018 11:38 AM   Modules accepted: Orders

## 2018-12-20 ENCOUNTER — Ambulatory Visit
Admission: RE | Admit: 2018-12-20 | Discharge: 2018-12-20 | Disposition: A | Discharge status: Home / Self Care | Payer: 59 | Source: Ambulatory Visit | Attending: Internal Medicine | Admitting: Internal Medicine

## 2018-12-20 ENCOUNTER — Inpatient Hospital Stay: Admission: RE | Admit: 2018-12-20 | Payer: 59 | Source: Ambulatory Visit

## 2018-12-20 DIAGNOSIS — R911 Solitary pulmonary nodule: Secondary | ICD-10-CM

## 2018-12-21 ENCOUNTER — Telehealth: Payer: Self-pay | Admitting: Cardiology

## 2018-12-21 NOTE — Telephone Encounter (Signed)
Please call regarding results of CT scan at Imaging center in [place].

## 2018-12-22 NOTE — Telephone Encounter (Signed)
Spoke with pt, questions regarding aortic atherosclerosis on recent CT scan answered.

## 2018-12-28 DIAGNOSIS — G4733 Obstructive sleep apnea (adult) (pediatric): Secondary | ICD-10-CM | POA: Diagnosis not present

## 2019-01-28 DIAGNOSIS — G4733 Obstructive sleep apnea (adult) (pediatric): Secondary | ICD-10-CM | POA: Diagnosis not present

## 2019-02-08 ENCOUNTER — Ambulatory Visit: Payer: 59 | Admitting: Cardiology

## 2019-02-27 DIAGNOSIS — G4733 Obstructive sleep apnea (adult) (pediatric): Secondary | ICD-10-CM | POA: Diagnosis not present

## 2019-03-09 ENCOUNTER — Ambulatory Visit: Payer: 59 | Admitting: Cardiovascular Disease

## 2019-03-23 ENCOUNTER — Telehealth: Payer: Self-pay | Admitting: Internal Medicine

## 2019-03-23 NOTE — Telephone Encounter (Signed)
Left detailed message TO CALL BACK TO BOOK  cpe

## 2019-03-23 NOTE — Telephone Encounter (Signed)
Received Fax RX request from  Pharmacy - [facility]  Medication - metFORMIN (GLUCOPHAGE) 500 MG tablet   Last Refill -3.3.20  Last OV - 12/06/18  Last CPE - 05/10/18

## 2019-03-24 ENCOUNTER — Other Ambulatory Visit: Payer: Self-pay

## 2019-03-24 MED ORDER — METFORMIN HCL 500 MG PO TABS: ORAL_TABLET | ORAL | 1 refills | Status: DC

## 2019-05-12 ENCOUNTER — Other Ambulatory Visit: Payer: Self-pay | Admitting: Internal Medicine

## 2019-06-19 ENCOUNTER — Other Ambulatory Visit: Payer: 59 | Admitting: Internal Medicine

## 2019-06-19 ENCOUNTER — Other Ambulatory Visit: Payer: Self-pay

## 2019-06-19 DIAGNOSIS — Z125 Encounter for screening for malignant neoplasm of prostate: Secondary | ICD-10-CM

## 2019-06-19 DIAGNOSIS — Z0000 Encounter for general adult medical examination without abnormal findings: Secondary | ICD-10-CM

## 2019-06-19 DIAGNOSIS — Z951 Presence of aortocoronary bypass graft: Secondary | ICD-10-CM

## 2019-06-19 DIAGNOSIS — R911 Solitary pulmonary nodule: Secondary | ICD-10-CM

## 2019-06-19 DIAGNOSIS — E7800 Pure hypercholesterolemia, unspecified: Secondary | ICD-10-CM

## 2019-06-19 NOTE — Progress Notes (Signed)
HPI: FU CAD.  Patient suffered a non-ST elevation myocardial infarction June 2019.  Cardiac catheterization at that time revealed severe three-vessel coronary artery disease and normal LV function.  Echocardiogram June 2019 showed normal LV function, mild diastolic dysfunction, mild to moderate mitral regurgitation, moderate biatrial enlargement and mild to moderate tricuspid regurgitation.  Preoperative carotid Dopplers showed 1 to 39% bilateral stenosis.  Patient had coronary artery bypass graft on April 25, 2018 with a LIMA to the LAD, saphenous vein graft to the PDA and saphenous vein graft to the circumflex.  Had postoperative atrial fibrillation treated with amiodarone. Also with OSA followed by Dr Claiborne Billings.  Since last seen, the patient has dyspnea with more extreme activities but not with routine activities. It is relieved with rest. It is not associated with chest pain. There is no orthopnea, PND or pedal edema. There is no syncope or palpitations. There is no exertional chest pain.  Mild residual chest soreness.   Current Outpatient Medications  Medication Sig Dispense Refill  . acetaminophen (TYLENOL) 325 MG tablet Take 325 mg by mouth every 6 (six) hours as needed.    Marland Kitchen aspirin EC 81 MG tablet Take 81 mg by mouth daily.    Marland Kitchen atorvastatin (LIPITOR) 80 MG tablet TAKE 1 TABLET BY MOUTH DAILY AT 6 PM. 90 tablet 2  . lisinopril (PRINIVIL,ZESTRIL) 2.5 MG tablet TAKE 1 TABLET BY MOUTH EVERY DAY 90 tablet 2  . metFORMIN (GLUCOPHAGE) 500 MG tablet One po daily with a meal 90 tablet 1  . metoprolol tartrate (LOPRESSOR) 25 MG tablet TAKE 1/2 TABLET(12.5 MG TOTAL) BY MOUTH 2 (TWO) TIMES DAILY. 90 tablet 1   No current facility-administered medications for this visit.      Past Medical History:  Diagnosis Date  . Hyperlipemia     Past Surgical History:  Procedure Laterality Date  . CORONARY ARTERY BYPASS GRAFT N/A 04/23/2018   Procedure: CORONARY ARTERY BYPASS GRAFTING (CABG) x3 using the  right greater saphenous vein harvested endoscopically and the left internal mammary artery. LIMA to LAD, SVG to Distal Circ, SVG to PD;  Surgeon: Grace Isaac, MD;  Location: [facility];  Service: Open Heart Surgery;  Laterality: N/A;  . LEFT HEART CATH AND CORONARY ANGIOGRAPHY N/A 04/21/2018   Procedure: LEFT HEART CATH AND CORONARY ANGIOGRAPHY;  Surgeon: Lorretta Harp, MD;  Location: [facility];  Service: Cardiovascular;  Laterality: N/A;  . TEE WITHOUT CARDIOVERSION N/A 04/23/2018   Procedure: TRANSESOPHAGEAL ECHOCARDIOGRAM (TEE);  Surgeon: Grace Isaac, MD;  Location: [facility];  Service: Open Heart Surgery;  Laterality: N/A;    Social History   Socioeconomic History  . Marital status: Married    Spouse name: Not on file  . Number of children: Not on file  . Years of education: Not on file  . Highest education level: Not on file  Occupational History  . Not on file  Social Needs  . Financial resource strain: Not on file  . Food insecurity    Worry: Not on file    Inability: Not on file  . Transportation needs    Medical: Not on file    Non-medical: Not on file  Tobacco Use  . Smoking status: Former Smoker    Types: Cigarettes    Start date: 10/26/1982    Quit date: 04/20/2018    Years since quitting: 1.1  . Smokeless tobacco: Never Used  . Tobacco comment: smoked about 20 cigarrets every 2 days.  Substance and Sexual Activity  . Alcohol use: Yes    Comment: socially   . Drug use: Never  . Sexual activity: Yes  Lifestyle  . Physical activity    Days per week: Not on file    Minutes per session: Not on file  . Stress: Not on file  Relationships  . Social Musician on phone: Not on file    Gets together: Not on file    Attends religious service: Not on file    Active member of club or organization: Not on file    Attends meetings of clubs or organizations: Not on file    Relationship status: Not on file  . Intimate partner violence    Fear of  current or ex partner: Not on file    Emotionally abused: Not on file    Physically abused: Not on file    Forced sexual activity: Not on file  Other Topics Concern  . Not on file  Social History Narrative  . Not on file    Family History  Problem Relation Age of Onset  . CAD Father   . Stroke Father   . CAD Brother   . Aortic stenosis Brother     ROS: no fevers or chills, productive cough, hemoptysis, dysphasia, odynophagia, melena, hematochezia, dysuria, hematuria, rash, seizure activity, orthopnea, PND, pedal edema, claudication. Remaining systems are negative.  Physical Exam: Well-developed well-nourished in no acute distress.  Skin is warm and dry.  HEENT is normal.  Neck is supple.  Chest is clear to auscultation with normal expansion.  Cardiovascular exam is regular rate and rhythm.  Abdominal exam nontender or distended. No masses palpated. Extremities show no edema. neuro grossly intact  ECG-sinus rhythm at a rate of 62, no ST changes.  Personally reviewed  A/P  1 coronary artery disease status post coronary artery bypass graft-patient without recurrent chest pain.  Continue medical therapy with aspirin and statin.  2 history of pulmonary nodule-followed by primary care.  3 hypertension-blood pressure is controlled today.  Continue present medications and follow.  4 hyperlipidemia-continue statin.  5 obstructive sleep apnea-followed by Dr. Tresa Endo.  Olga Millers, MD

## 2019-06-20 LAB — LIPID PANEL
Cholesterol: 134 mg/dL (ref <200)
HDL: 60 mg/dL (ref >=40)
LDL Cholesterol (Calc): 61 mg/dL (calc)
Non-HDL Cholesterol (Calc): 74 mg/dL (calc) (ref <130)
Total CHOL/HDL Ratio: 2.2 (calc) (ref <5.0)
Triglycerides: 52 mg/dL (ref <150)

## 2019-06-20 LAB — COMPLETE METABOLIC PANEL WITH GFR
AG Ratio: 2 (calc) (ref 1.0–2.5)
ALT: 23 U/L (ref 9–46)
AST: 22 U/L (ref 10–35)
Albumin: 4.6 g/dL (ref 3.6–5.1)
Alkaline phosphatase (APISO): 61 U/L (ref 35–144)
BUN: 17 mg/dL (ref 7–25)
CO2: 30 mmol/L (ref 20–32)
Calcium: 9.8 mg/dL (ref 8.6–10.3)
Chloride: 101 mmol/L (ref 98–110)
Creat: 0.95 mg/dL (ref 0.70–1.25)
GFR, Est African American: 100 mL/min/{1.73_m2} (ref >=60)
GFR, Est Non African American: 87 mL/min/{1.73_m2} (ref >=60)
Globulin: 2.3 g/dL (calc) (ref 1.9–3.7)
Glucose, Bld: 115 mg/dL — ABNORMAL HIGH (ref 65–99)
Potassium: 5.1 mmol/L (ref 3.5–5.3)
Sodium: 139 mmol/L (ref 135–146)
Total Bilirubin: 1.2 mg/dL (ref 0.2–1.2)
Total Protein: 6.9 g/dL (ref 6.1–8.1)

## 2019-06-20 LAB — CBC WITH DIFFERENTIAL/PLATELET
Absolute Monocytes: 773 cells/uL (ref 200–950)
Basophils Absolute: 23 cells/uL (ref 0–200)
Basophils Relative: 0.3 %
Eosinophils Absolute: 53 cells/uL (ref 15–500)
Eosinophils Relative: 0.7 %
HCT: 42.4 % (ref 38.5–50.0)
Hemoglobin: 14.5 g/dL (ref 13.2–17.1)
Lymphs Abs: 2288 cells/uL (ref 850–3900)
MCH: 30.3 pg (ref 27.0–33.0)
MCHC: 34.2 g/dL (ref 32.0–36.0)
MCV: 88.5 fL (ref 80.0–100.0)
MPV: 9.8 fL (ref 7.5–12.5)
Monocytes Relative: 10.3 %
Neutro Abs: 4365 cells/uL (ref 1500–7800)
Neutrophils Relative %: 58.2 %
Platelets: 317 10*3/uL (ref 140–400)
RBC: 4.79 10*6/uL (ref 4.20–5.80)
RDW: 12.9 % (ref 11.0–15.0)
Total Lymphocyte: 30.5 %
WBC: 7.5 10*3/uL (ref 3.8–10.8)

## 2019-06-20 LAB — PSA: PSA: 2.5 ng/mL (ref <=4.0)

## 2019-06-20 LAB — HEMOGLOBIN A1C
Hgb A1c MFr Bld: 6.1 % of total Hgb — ABNORMAL HIGH (ref <5.7)
Mean Plasma Glucose: 128 (calc)
eAG (mmol/L): 7.1 (calc)

## 2019-06-21 ENCOUNTER — Other Ambulatory Visit: Payer: Self-pay

## 2019-06-21 ENCOUNTER — Ambulatory Visit (INDEPENDENT_AMBULATORY_CARE_PROVIDER_SITE_OTHER): Payer: 59 | Admitting: Cardiology

## 2019-06-21 ENCOUNTER — Encounter: Payer: Self-pay | Admitting: Cardiology

## 2019-06-21 VITALS — BP 124/70 | HR 62 | Ht 69.0 in | Wt 204.0 lb

## 2019-06-21 DIAGNOSIS — E785 Hyperlipidemia, unspecified: Secondary | ICD-10-CM | POA: Diagnosis not present

## 2019-06-21 DIAGNOSIS — G4733 Obstructive sleep apnea (adult) (pediatric): Secondary | ICD-10-CM | POA: Diagnosis not present

## 2019-06-21 DIAGNOSIS — I2510 Atherosclerotic heart disease of native coronary artery without angina pectoris: Secondary | ICD-10-CM | POA: Diagnosis not present

## 2019-06-21 DIAGNOSIS — I10 Essential (primary) hypertension: Secondary | ICD-10-CM | POA: Diagnosis not present

## 2019-06-21 NOTE — Patient Instructions (Signed)
Medication Instructions:  NO CHANGE If you need a refill on your cardiac medications before your next appointment, please call your pharmacy.   Lab work: If you have labs (blood work) drawn today and your tests are completely normal, you will receive your results only by: . MyChart Message (if you have MyChart) OR . A paper copy in the mail If you have any lab test that is abnormal or we need to change your treatment, we will call you to review the results.  Follow-Up: At [facility], you and your health needs are our priority.  As part of our continuing mission to provide you with exceptional heart care, we have created designated Provider Care Teams.  These Care Teams include your primary Cardiologist (physician) and Advanced Practice Providers (APPs -  Physician Assistants and Nurse Practitioners) who all work together to provide you with the care you need, when you need it. Your physician wants you to follow-up in: ONE YEAR WITH DR CRENSHAW You will receive a reminder letter in the mail two months in advance. If you don't receive a letter, please call our office to schedule the follow-up appointment.      

## 2019-06-23 ENCOUNTER — Ambulatory Visit (INDEPENDENT_AMBULATORY_CARE_PROVIDER_SITE_OTHER): Payer: 59 | Admitting: Internal Medicine

## 2019-06-23 ENCOUNTER — Encounter: Payer: Self-pay | Admitting: Internal Medicine

## 2019-06-23 ENCOUNTER — Other Ambulatory Visit: Payer: Self-pay

## 2019-06-23 VITALS — BP 110/60 | HR 68 | Temp 98.3°F | Ht 69.0 in | Wt 204.0 lb

## 2019-06-23 DIAGNOSIS — Z0000 Encounter for general adult medical examination without abnormal findings: Secondary | ICD-10-CM | POA: Diagnosis not present

## 2019-06-23 DIAGNOSIS — G4733 Obstructive sleep apnea (adult) (pediatric): Secondary | ICD-10-CM

## 2019-06-23 DIAGNOSIS — R7302 Impaired glucose tolerance (oral): Secondary | ICD-10-CM | POA: Diagnosis not present

## 2019-06-23 DIAGNOSIS — Z23 Encounter for immunization: Secondary | ICD-10-CM

## 2019-06-23 DIAGNOSIS — I252 Old myocardial infarction: Secondary | ICD-10-CM

## 2019-06-23 DIAGNOSIS — Z951 Presence of aortocoronary bypass graft: Secondary | ICD-10-CM | POA: Diagnosis not present

## 2019-06-23 DIAGNOSIS — E7800 Pure hypercholesterolemia, unspecified: Secondary | ICD-10-CM

## 2019-06-23 LAB — POCT URINALYSIS DIPSTICK
Appearance: NEGATIVE
Bilirubin, UA: NEGATIVE
Blood, UA: NEGATIVE
Glucose, UA: NEGATIVE
Ketones, UA: NEGATIVE
Leukocytes, UA: NEGATIVE
Nitrite, UA: NEGATIVE
Odor: NEGATIVE
Protein, UA: NEGATIVE
Spec Grav, UA: 1.015 (ref 1.010–1.025)
Urobilinogen, UA: 0.2 E.U./dL
pH, UA: 6.5 (ref 5.0–8.0)

## 2019-06-23 NOTE — Progress Notes (Signed)
   Subjective:    Patient ID: Zachary [name], male    DOB: 07-15-1961, 58 y.o.   MRN: [id number]  HPI 58 year old Male for health maintenance exam and evaluation of medical issues.  Patient was hospitalized with an NSTEMI April 20, 2018.  On April 21, 2018 he underwent cardiac cath by Dr. Gwenlyn Found.  He had a mid RCA lesion 90% stenosed, mid circumflex lesion 95% stenosed and proximal LAD 75% stenosed.  Subsequently underwent coronary artery bypass grafting x3 to the LAD, posterior descending coronary artery, distal circumflex.  He has done well since that time.  He went to cardiac rehab.  He has been diagnosed with obstructive sleep apnea.  Postoperatively he was iron deficient and this has been corrected.  Past medical history remarkable for hyperlipidemia.  In February he had Herpes zoster without complication and was treated with Valtrex.  Social history: History of smoking cigarettes but has quit.  Social alcohol consumption.  2 adult children, a son and a daughter.  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  Family history: Aortic stenosis in his brother.  Coronary artery disease in brother and father.  Stroke in father.  No known drug allergies  History of left tibia fracture 1983.  Old records from [facility] indicate he had shingles vaccine in October 23, 2015.  History of vitamin D deficiency at that time with level being 21.7.  He had a normal PSA at that time at 1.91.  He had an LDL of 148.  Fasting glucose was 113.        Review of Systems no new complaints.  He exercises and plays golf.  He is trying to take care of himself and is interested in his health.  He wants a prescription for Shingrix vaccine but he is already had shingles and I think male during the pandemic it is better that we defer that vaccination due to side effects     Objective:   Physical Exam Blood pressure 110/60 pulse 68 temperature 98.3 pulse oximetry 98% weight 204 pounds BMI 30.13 Skin  warm and dry.  Nodes none.  Neck is supple without JVD thyromegaly or carotid bruits.  Chest is clear to auscultation.  Cardiac exam regular rate and rhythm normal S1 and S2.  Abdomen soft nondistended without hepatosplenomegaly masses or tenderness.  Prostate is normal without nodules.  No lower extremity edema.  Neuro no focal deficits.  Affect is normal.      Assessment & Plan:  History of MI status post three-vessel CABG 2019  Hyperlipidemia-stable on lipid-lowering medication  History of sleep apnea  History of Herpes zoster-defer Shingrix vaccine until pandemic is over  Impaired glucose tolerance-hemoglobin A1c 6.1%.  Continue to monitor.  Health maintenance-inquire regarding colonoscopy  Plan: Flu vaccine.  Continue current medications and follow-up in 6 months.

## 2019-06-25 NOTE — Patient Instructions (Signed)
Flu vaccine given.  Continue current medications and follow-up in 6 months.

## 2019-07-10 ENCOUNTER — Telehealth: Payer: Self-pay

## 2019-07-10 ENCOUNTER — Ambulatory Visit (INDEPENDENT_AMBULATORY_CARE_PROVIDER_SITE_OTHER): Payer: 59 | Admitting: Internal Medicine

## 2019-07-10 ENCOUNTER — Encounter: Payer: Self-pay | Admitting: Internal Medicine

## 2019-07-10 ENCOUNTER — Other Ambulatory Visit: Payer: Self-pay

## 2019-07-10 VITALS — BP 120/70 | HR 60 | Temp 98.3°F | Ht 69.0 in | Wt 204.0 lb

## 2019-07-10 DIAGNOSIS — Z23 Encounter for immunization: Secondary | ICD-10-CM

## 2019-07-10 NOTE — Telephone Encounter (Signed)
Order written

## 2019-07-10 NOTE — Progress Notes (Signed)
Tdap given per CMA

## 2019-07-10 NOTE — Patient Instructions (Signed)
Patient received a TDAP IM, left deltoid.

## 2019-07-10 NOTE — Telephone Encounter (Signed)
Patient would like an rx so he can get his shingle shots. He said we can mail it to him. Thank you.

## 2019-08-09 ENCOUNTER — Other Ambulatory Visit: Payer: Self-pay | Admitting: Cardiology

## 2019-08-21 ENCOUNTER — Other Ambulatory Visit: Payer: Self-pay

## 2019-08-21 MED ORDER — ATORVASTATIN CALCIUM 80 MG PO TABS: ORAL_TABLET | ORAL | 3 refills | Status: DC

## 2019-09-23 ENCOUNTER — Other Ambulatory Visit: Payer: Self-pay | Admitting: Internal Medicine

## 2019-11-07 ENCOUNTER — Telehealth: Payer: Self-pay | Admitting: Cardiology

## 2019-11-07 ENCOUNTER — Other Ambulatory Visit: Payer: Self-pay | Admitting: Internal Medicine

## 2019-11-07 NOTE — Telephone Encounter (Signed)
Patient is at [facility] location and cannot be seen today, please call regarding increased blood pressure

## 2019-11-07 NOTE — Telephone Encounter (Signed)
Spoke with pt and has concerns re elevated B/P this am 145-152/80-90 after meds  HR ranging 65-70  Pt also notes ear pressure and noted upon awakening some hand numbness.Pt's brother has passed recently and this maybe adding some stressors Pt wanted to be seen Appt made With Edd Fabian NP for 11/08/19 at 2:15 pm Will forward to Dr Jens Som for review .Zack Seal

## 2019-11-07 NOTE — Progress Notes (Signed)
Cardiology Clinic Note   Patient Name: Zachary [name] Date of Encounter: 11/08/2019  Primary Care Provider:  Margaree Mackintosh, MD Primary Cardiologist:  Olga Millers, MD  Patient Profile    Zachary [name] 59 year old male presents today for follow-up of his NSTEMI,  status post CABG x3 (LIMA-LAD, SVG-PDA, SVG-circumflex), and hypertension.  Past Medical History    Past Medical History:  Diagnosis Date  . Hyperlipemia    Past Surgical History:  Procedure Laterality Date  . CORONARY ARTERY BYPASS GRAFT N/A 04/23/2018   Procedure: CORONARY ARTERY BYPASS GRAFTING (CABG) x3 using the right greater saphenous vein harvested endoscopically and the left internal mammary artery. LIMA to LAD, SVG to Distal Circ, SVG to PD;  Surgeon: Delight Ovens, MD;  Location: [facility];  Service: Open Heart Surgery;  Laterality: N/A;  . LEFT HEART CATH AND CORONARY ANGIOGRAPHY N/A 04/21/2018   Procedure: LEFT HEART CATH AND CORONARY ANGIOGRAPHY;  Surgeon: Runell Gess, MD;  Location: [facility];  Service: Cardiovascular;  Laterality: N/A;  . TEE WITHOUT CARDIOVERSION N/A 04/23/2018   Procedure: TRANSESOPHAGEAL ECHOCARDIOGRAM (TEE);  Surgeon: Delight Ovens, MD;  Location: [facility];  Service: Open Heart Surgery;  Laterality: N/A;    Allergies  No Known Allergies  History of Present Illness    Mr. Marchena is a past medical history of non-STEMI 03/2018.  His cardiac catheterization showed severe multivessel disease and normal left ventricular function.  His echocardiogram 03/2018 showed normal LV function, mild diastolic dysfunction, mild to moderate mitral regurgitation, moderate bilateral enlargement and mild to moderate tricuspid regurgitation.  His preoperative carotid Doppler showed 1-39% bilateral stenosis.  He had CABG x3 on 04/25/2018(LIMA-LAD, SVG-PDA, SVG-circumflex).  He had postoperative atrial fibrillation that was treated with amiodarone.  He is also followed by Dr. Tresa Endo for OSA.  He was  last seen by Dr. Jens Som 05/2019.  During that time he noted dyspnea with more vigorous activities but not with routine activities.  This was relieved with rest.  He denies chest pain, orthopnea PND and lower extremity edema.  He denied syncope and palpitations.  He had no exertional chest pain and noted mild residual chest soreness.  He contacted the cardiology clinic on 11/07/2019 and stated that he had been having elevated blood pressures 145-152/80-90.  He also indicated that he was having some ear pressure as well as hand numbness.  He presents today for evaluation and states he has been under a great deal of stress with his brother dying last week.  He states that he also had coronary artery disease.  He states that his brother passed away in the ambulance on the way to the hospital with a cardiac related event.  He indicates that he has not been eating well over the holidays and has been neglecting to take his blood sugar or his blood pressure during this time.  However, he continues to be physically active and is golfing 2-3 times per week.  Yesterday he checked his blood pressure and noted that it was elevated.  I will increase his lisinopril to 5 mg daily, give him salty 60, give him the mindfulness stress reduction handout, and order a BMP for 1 week.  He denies chest pain, shortness of breath, lower extremity edema, fatigue, palpitations, melena, hematuria, hemoptysis, diaphoresis, weakness, presyncope, syncope, orthopnea, and PND.   Home Medications    Prior to Admission medications   Medication Sig Start Date End Date Taking? Authorizing Provider  acetaminophen (TYLENOL) 325 MG tablet  Take 325 mg by mouth every 6 (six) hours as needed.    [provider]  aspirin EC 81 MG tablet Take 81 mg by mouth daily.    [provider]  atorvastatin (LIPITOR) 80 MG tablet TAKE 1 TABLET BY MOUTH DAILY AT 6 PM. 08/21/19   Margaree Mackintosh, MD  lisinopril (ZESTRIL) 2.5 MG tablet TAKE 1  TABLET BY MOUTH EVERY DAY 08/09/19   Lewayne Bunting, MD  metFORMIN (GLUCOPHAGE) 500 MG tablet TAKE 1 TABLET BY MOUTH EVERY DAY WITH A MEAL 09/23/19   Margaree Mackintosh, MD  metoprolol tartrate (LOPRESSOR) 25 MG tablet TAKE 1/2 TABLET(12.5 MG TOTAL) BY MOUTH 2 (TWO) TIMES DAILY. 11/07/19   Margaree Mackintosh, MD    Family History    Family History  Problem Relation Age of Onset  . CAD Father   . Stroke Father   . CAD Brother   . Aortic stenosis Brother    He indicated that his mother is alive. He indicated that his father is alive. He indicated that the status of his brother is unknown.  Social History    Social History   Socioeconomic History  . Marital status: Married    Spouse name: Not on file  . Number of children: Not on file  . Years of education: Not on file  . Highest education level: Not on file  Occupational History  . Not on file  Tobacco Use  . Smoking status: Former Smoker    Types: Cigarettes    Start date: 10/26/1982    Quit date: 04/20/2018    Years since quitting: 1.5  . Smokeless tobacco: Never Used  . Tobacco comment: smoked about 20 cigarrets every 2 days.   Substance and Sexual Activity  . Alcohol use: Yes    Comment: socially   . Drug use: Never  . Sexual activity: Yes  Other Topics Concern  . Not on file  Social History Narrative  . Not on file   Social Determinants of Health   Financial Resource Strain:   . Difficulty of Paying Living Expenses: Not on file  Food Insecurity:   . Worried About Programme researcher, broadcasting/film/video in the Last Year: Not on file  . Ran Out of Food in the Last Year: Not on file  Transportation Needs:   . Lack of Transportation (Medical): Not on file  . Lack of Transportation (Non-Medical): Not on file  Physical Activity:   . Days of Exercise per Week: Not on file  . Minutes of Exercise per Session: Not on file  Stress:   . Feeling of Stress : Not on file  Social Connections:   . Frequency of Communication with Friends and  Family: Not on file  . Frequency of Social Gatherings with Friends and Family: Not on file  . Attends Religious Services: Not on file  . Active Member of Clubs or Organizations: Not on file  . Attends Banker Meetings: Not on file  . Marital Status: Not on file  Intimate Partner Violence:   . Fear of Current or Ex-Partner: Not on file  . Emotionally Abused: Not on file  . Physically Abused: Not on file  . Sexually Abused: Not on file     Review of Systems    General:  No chills, fever, night sweats or weight changes.  Cardiovascular:  No chest pain, dyspnea on exertion, edema, orthopnea, palpitations, paroxysmal nocturnal dyspnea. Dermatological: No rash, lesions/masses Respiratory: No cough, dyspnea Urologic: No  hematuria, dysuria Abdominal:   No nausea, vomiting, diarrhea, bright red blood per rectum, melena, or hematemesis Neurologic:  No visual changes, wkns, changes in mental status. All other systems reviewed and are otherwise negative except as noted above.  Physical Exam    VS:  BP (!) 141/81   Pulse 83   Ht 5\' 9"  (1.753 m)   Wt 210 lb (95.3 kg)   BMI 31.01 kg/m  , BMI Body mass index is 31.01 kg/m. GEN: Well nourished, well developed, in no acute distress. HEENT: normal. Neck: Supple, no JVD, carotid bruits, or masses. Cardiac: RRR, no murmurs, rubs, or gallops. No clubbing, cyanosis, edema.  Radials/DP/PT 2+ and equal bilaterally.  Respiratory:  Respirations regular and unlabored, clear to auscultation bilaterally. GI: Soft, nontender, nondistended, BS + x 4. MS: no deformity or atrophy. Skin: warm and dry, no rash. Neuro:  Strength and sensation are intact. Psych: Normal affect.  Accessory Clinical Findings    ECG personally reviewed by me today-none today.  EKG 06/21/2019 Normal sinus rhythm 62 bpm  Echocardiogram 04/25/2018 TEE Result status: Final result   Tricuspid valve: Mild to moderate regurgitation. The tricuspid valve regurgitation  jet is central.  Right ventricle: Normal wall thickness and ejection fraction. Cavity is mildly dilated.  Septum: No Patent Foramen Ovale present.  Left atrium: Patent foramen ovale not present.  Mitral valve: Dilated mitral annulus. Mild leaflet thickening is present. Mild leaflet calcification is present. Mild mitral annular calcification. Mild regurgitation.  Aortic valve: The valve is trileaflet. Mild valve thickening present. No stenosis. No regurgitation.     LHC 04/21/2018  Mid RCA lesion is 90% stenosed.  Post Atrio lesion is 50% stenosed.  Mid Cx lesion is 95% stenosed.  Prox LAD lesion is 75% stenosed.  The left ventricular systolic function is normal.  LV end diastolic pressure is normal.  The left ventricular ejection fraction is 55-65% by visual estimate. Diagnostic Dominance: Right      Assessment & Plan   1.  Essential hypertension-BP today 141/81 Increase lisinopril 5 mg daily Continue metoprolol tartrate 12.5 mg twice daily Heart healthy low-sodium diet salty 6 given Increase physical activity as tolerated Blood pressure log Mindfulness stress reduction Repeat BMP in 1 week  Coronary artery disease-status post CABG x3 04/25/2018.  Denies chest pain. Continue aspirin 81 mg daily Continue atorvastatin 80 mg tablet daily Heart healthy low-sodium high-fiber diet Physical activity goal 150 minutes of moderate physical activity weekly  Hyperlipidemia-06/19/2019: Cholesterol 134; HDL 60; LDL Cholesterol (Calc) 61; Triglycerides 52 Continue atorvastatin 80 mg tablet daily  OSA-compliant with CPAP Followed by Dr. Claiborne Billings   Disposition: Follow-up with me in 1 month  Jossie Ng. [facility] [place] [address] Office [phone] Fax [phone]

## 2019-11-08 ENCOUNTER — Other Ambulatory Visit: Payer: Self-pay

## 2019-11-08 ENCOUNTER — Encounter: Payer: Self-pay | Admitting: General Practice

## 2019-11-08 ENCOUNTER — Ambulatory Visit (INDEPENDENT_AMBULATORY_CARE_PROVIDER_SITE_OTHER): Payer: 59 | Admitting: General Practice

## 2019-11-08 VITALS — BP 141/81 | HR 83 | Ht 69.0 in | Wt 210.0 lb

## 2019-11-08 DIAGNOSIS — I2510 Atherosclerotic heart disease of native coronary artery without angina pectoris: Secondary | ICD-10-CM

## 2019-11-08 DIAGNOSIS — Z951 Presence of aortocoronary bypass graft: Secondary | ICD-10-CM

## 2019-11-08 DIAGNOSIS — I10 Essential (primary) hypertension: Secondary | ICD-10-CM | POA: Diagnosis not present

## 2019-11-08 DIAGNOSIS — I214 Non-ST elevation (NSTEMI) myocardial infarction: Secondary | ICD-10-CM

## 2019-11-08 DIAGNOSIS — G4733 Obstructive sleep apnea (adult) (pediatric): Secondary | ICD-10-CM

## 2019-11-08 MED ORDER — LISINOPRIL 5 MG PO TABS: 5.0000 mg | ORAL_TABLET | Freq: Every day | ORAL | 1 refills | Status: DC

## 2019-11-08 NOTE — Patient Instructions (Signed)
Medication Instructions:   Increase Lisinopril to 5 mg daily.  *If you need a refill on your cardiac medications before your next appointment, please call your pharmacy*  Lab Work: Your physician recommends that you return for lab work in: 1 week--BMET. You do not need an appointment for labs done in our office. Please bring your lab slips with you when you return for blood work.  If you have labs (blood work) drawn today and your tests are completely normal, you will receive your results only by: Marland Kitchen MyChart Message (if you have MyChart) OR . A paper copy in the mail If you have any lab test that is abnormal or we need to change your treatment, we will call you to review the results.   Follow-Up: At [facility], you and your health needs are our priority.  As part of our continuing mission to provide you with exceptional heart care, we have created designated Provider Care Teams.  These Care Teams include your primary Cardiologist (physician) and Advanced Practice Providers (APPs -  Physician Assistants and Nurse Practitioners) who all work together to provide you with the care you need, when you need it.  Your next appointment:   1 month(s)  The format for your next appointment:   In Person  Provider:   Edd Fabian, NP  Other Instructions Your physician has requested that you regularly monitor and record your blood pressure readings at home 3 times per week, one hour after taking BP med or if feeling bad. Please use the same machine at the same time of day to check your readings and record them to bring to your follow-up visit.  Mindfulness-Based Stress Reduction Mindfulness-based stress reduction (MBSR) is a program that helps people learn to practice mindfulness. Mindfulness is the practice of intentionally paying attention to the present moment. It can be learned and practiced through techniques such as education, breathing exercises, meditation, and yoga. MBSR includes several  mindfulness techniques in one program. MBSR works best when you understand the treatment, are willing to try new things, and can commit to spending time practicing what you learn. MBSR training may include learning about:  How your emotions, thoughts, and reactions affect your body.  New ways to respond to things that cause negative thoughts to start (triggers).  How to notice your thoughts and let go of them.  Practicing awareness of everyday things that you normally do without thinking.  The techniques and goals of different types of meditation. What are the benefits of MBSR? MBSR can have many benefits, which include helping you to:  Develop self-awareness. This refers to knowing and understanding yourself.  Learn skills and attitudes that help you to participate in your own health care.  Learn new ways to care for yourself.  Be more accepting about how things are, and let things go.  Be less judgmental and approach things with an open mind.  Be patient with yourself and trust yourself more. MBSR has also been shown to:  Reduce negative emotions, such as depression and anxiety.  Improve memory and focus.  Change how you sense and approach pain.  Boost your body's ability to fight infections.  Help you connect better with other people.  Improve your sense of well-being. Follow these instructions at home:   Find a local in-person or online MBSR program.  Set aside some time regularly for mindfulness practice.  Find a mindfulness practice that works best for you. This may include one or more of the following: ?  Meditation. Meditation involves focusing your mind on a certain thought or activity. ? Breathing awareness exercises. These help you to stay present by focusing on your breath. ? Body scan. For this practice, you lie down and pay attention to each part of your body from head to toe. You can identify tension and soreness and intentionally relax parts of your  body. ? Yoga. Yoga involves stretching and breathing, and it can improve your ability to move and be flexible. It can also provide an experience of testing your body's limits, which can help you release stress. ? Mindful eating. This way of eating involves focusing on the taste, texture, color, and smell of each bite of food. Because this slows down eating and helps you feel full sooner, it can be an important part of a weight-loss plan.  Find a podcast or recording that provides guidance for breathing awareness, body scan, or meditation exercises. You can listen to these any time when you have a free moment to rest without distractions.  Follow your treatment plan as told by your health care provider. This may include taking regular medicines and making changes to your diet or lifestyle as recommended. How to practice mindfulness To do a basic awareness exercise:  Find a comfortable place to sit.  Pay attention to the present moment. Observe your thoughts, feelings, and surroundings just as they are.  Avoid placing judgment on yourself, your feelings, or your surroundings. Make note of any judgment that comes up, and let it go.  Your mind may wander, and that is okay. Make note of when your thoughts drift, and return your attention to the present moment. To do basic mindfulness meditation:  Find a comfortable place to sit. This may include a stable chair or a firm floor cushion. ? Sit upright with your back straight. Let your arms fall next to your side with your hands resting on your legs. ? If sitting in a chair, rest your feet flat on the floor. ? If sitting on a cushion, cross your legs in front of you.  Keep your head in a neutral position with your chin dropped slightly. Relax your jaw and rest the tip of your tongue on the roof of your mouth. Drop your gaze to the floor. You can close your eyes if you like.  Breathe normally and pay attention to your breath. Feel the air moving in  and out of your nose. Feel your belly expanding and relaxing with each breath.  Your mind may wander, and that is okay. Make note of when your thoughts drift, and return your attention to your breath.  Avoid placing judgment on yourself, your feelings, or your surroundings. Make note of any judgment or feelings that come up, let them go, and bring your attention back to your breath.  When you are ready, lift your gaze or open your eyes. Pay attention to how your body feels after the meditation. Where to find more information You can find more information about MBSR from:  Your health care provider.  Community-based meditation centers or programs.  Programs offered near you. Summary  Mindfulness-based stress reduction (MBSR) is a program that teaches you how to intentionally pay attention to the present moment. It is used with other treatments to help you cope better with daily stress, emotions, and pain.  MBSR focuses on developing self-awareness, which allows you to respond to life stress without judgment or negative emotions.  MBSR programs may involve learning different mindfulness practices, such as  breathing exercises, meditation, yoga, body scan, or mindful eating. Find a mindfulness practice that works best for you, and set aside time for it on a regular basis. This information is not intended to replace advice given to you by your health care provider. Make sure you discuss any questions you have with your health care provider. Document Revised: 09/24/2017 Document Reviewed: 02/18/2017 Elsevier Patient Education  2020 [employer].

## 2019-11-15 LAB — BASIC METABOLIC PANEL
BUN/Creatinine Ratio: 16 (ref 10–24)
BUN: 16 mg/dL (ref 8–27)
CO2: 23 mmol/L (ref 20–29)
Calcium: 9.5 mg/dL (ref 8.6–10.2)
Chloride: 101 mmol/L (ref 96–106)
Creatinine, Ser: 0.98 mg/dL (ref 0.76–1.27)
GFR calc Af Amer: 96 mL/min/{1.73_m2} (ref >59)
GFR calc non Af Amer: 83 mL/min/{1.73_m2} (ref >59)
Glucose: 122 mg/dL — ABNORMAL HIGH (ref 65–99)
Potassium: 5.3 mmol/L — ABNORMAL HIGH (ref 3.5–5.2)
Sodium: 139 mmol/L (ref 134–144)

## 2019-12-08 NOTE — Progress Notes (Signed)
Cardiology Clinic Note   Patient Name: Zachary [name] Date of Encounter: 12/11/2019  Primary Care Provider:  Margaree Mackintosh, MD Primary Cardiologist:  Olga Millers, MD  Patient Profile    Zachary [name] 59 year old male presents today for follow-up of his NSTEMI,  status post CABG x3 (LIMA-LAD, SVG-PDA, SVG-circumflex), and hypertension.  Past Medical History    Past Medical History:  Diagnosis Date  . Hyperlipemia    Past Surgical History:  Procedure Laterality Date  . CORONARY ARTERY BYPASS GRAFT N/A 04/23/2018   Procedure: CORONARY ARTERY BYPASS GRAFTING (CABG) x3 using the right greater saphenous vein harvested endoscopically and the left internal mammary artery. LIMA to LAD, SVG to Distal Circ, SVG to PD;  Surgeon: Delight Ovens, MD;  Location: [facility] OR;  Service: Open Heart Surgery;  Laterality: N/A;  . LEFT HEART CATH AND CORONARY ANGIOGRAPHY N/A 04/21/2018   Procedure: LEFT HEART CATH AND CORONARY ANGIOGRAPHY;  Surgeon: Runell Gess, MD;  Location: [facility] INVASIVE CV LAB;  Service: Cardiovascular;  Laterality: N/A;  . TEE WITHOUT CARDIOVERSION N/A 04/23/2018   Procedure: TRANSESOPHAGEAL ECHOCARDIOGRAM (TEE);  Surgeon: Delight Ovens, MD;  Location: [facility] OR;  Service: Open Heart Surgery;  Laterality: N/A;    Allergies  No Known Allergies  History of Present Illness    Zachary [name] is a past medical history of non-STEMI 03/2018.  His cardiac catheterization showed severe multivessel disease and normal left ventricular function.  His echocardiogram 03/2018 showed normal LV function, mild diastolic dysfunction, mild to moderate mitral regurgitation, moderate bilateral enlargement and mild to moderate tricuspid regurgitation.  His preoperative carotid Doppler showed 1-39% bilateral stenosis.  He had CABG x3 on 04/25/2018(LIMA-LAD, SVG-PDA, SVG-circumflex).  He had postoperative atrial fibrillation that was treated with amiodarone.  He is also followed by Dr. Tresa Endo for OSA.  He was  last seen by Dr. Jens Som 05/2019.  During that time he noted dyspnea with more vigorous activities but not with routine activities.  This was relieved with rest.  He denies chest pain, orthopnea PND and lower extremity edema.  He denied syncope and palpitations.  He had no exertional chest pain and noted mild residual chest soreness.  He contacted the cardiology clinic on 11/07/2019 and stated that he had been having elevated blood pressures 145-152/80-90.  He also indicated that he was having some ear pressure as well as hand numbness.  He presented to the clinic 11/08/2019 for evaluation and stated he had been under a great deal of stress with his brother dying last week.  He stated that his brother also had coronary artery disease.  He stated that his brother passed away in the ambulance on the way to the hospital with a cardiac related event.  He indicated that he had not been eating well over the holidays and had been neglecting to take his blood sugar or his blood pressure during that time.  However, he continued to be physically active and was golfing 2-3 times per week.    11/07/2019 he checked his blood pressure and noted that it was elevated.  I increased lisinopril to 5 mg daily, gave him salty 6, gave him the mindfulness stress reduction handout, and order a BMP for 1 week.  He presents the clinic today and states his blood pressures been much better controlled after the addition of the extra 2.5 lisinopril.  He states he is managing his grief of losing his brother fairly well.  He continues to visit his parents that live in  our way weekly.  His potassium was slightly elevated with the last BMP.  I have instructed him to use moderation with potassium containing foods and given him a information sheet.  He denies any use of potassium supplementation/sports drinks/multivitamin.  He denies chest pain, shortness of breath, lower extremity edema, fatigue, palpitations, melena, hematuria, hemoptysis,  diaphoresis, weakness, presyncope, syncope, orthopnea, and PND.  Home Medications    Prior to Admission medications   Medication Sig Start Date End Date Taking? Authorizing Provider  acetaminophen (TYLENOL) 325 MG tablet Take 325 mg by mouth every 6 (six) hours as needed.    [provider]  aspirin EC 81 MG tablet Take 81 mg by mouth daily.    [provider]  atorvastatin (LIPITOR) 80 MG tablet TAKE 1 TABLET BY MOUTH DAILY AT 6 PM. 08/21/19   Baxley, Luanna Cole, MD  lisinopril (ZESTRIL) 5 MG tablet Take 1 tablet (5 mg total) by mouth daily. 11/08/19   Ronney Asters, NP  metFORMIN (GLUCOPHAGE) 500 MG tablet TAKE 1 TABLET BY MOUTH EVERY DAY WITH A MEAL 09/23/19   Margaree Mackintosh, MD  metoprolol tartrate (LOPRESSOR) 25 MG tablet TAKE 1/2 TABLET(12.5 MG TOTAL) BY MOUTH 2 (TWO) TIMES DAILY. 11/07/19   Margaree Mackintosh, MD    Family History    Family History  Problem Relation Age of Onset  . CAD Father   . Stroke Father   . CAD Brother   . Aortic stenosis Brother    He indicated that his mother is alive. He indicated that his father is alive. He indicated that the status of his brother is unknown.  Social History    Social History   Socioeconomic History  . Marital status: Married    Spouse name: Not on file  . Number of children: Not on file  . Years of education: Not on file  . Highest education level: Not on file  Occupational History  . Not on file  Tobacco Use  . Smoking status: Former Smoker    Types: Cigarettes    Start date: 10/26/1982    Quit date: 04/20/2018    Years since quitting: 1.6  . Smokeless tobacco: Never Used  . Tobacco comment: smoked about 20 cigarrets every 2 days.   Substance and Sexual Activity  . Alcohol use: Yes    Comment: socially   . Drug use: Never  . Sexual activity: Yes  Other Topics Concern  . Not on file  Social History Narrative  . Not on file   Social Determinants of Health   Financial Resource Strain:   . Difficulty  of Paying Living Expenses: Not on file  Food Insecurity:   . Worried About Programme researcher, broadcasting/film/video in the Last Year: Not on file  . Ran Out of Food in the Last Year: Not on file  Transportation Needs:   . Lack of Transportation (Medical): Not on file  . Lack of Transportation (Non-Medical): Not on file  Physical Activity:   . Days of Exercise per Week: Not on file  . Minutes of Exercise per Session: Not on file  Stress:   . Feeling of Stress : Not on file  Social Connections:   . Frequency of Communication with Friends and Family: Not on file  . Frequency of Social Gatherings with Friends and Family: Not on file  . Attends Religious Services: Not on file  . Active Member of Clubs or Organizations: Not on file  . Attends Banker  Meetings: Not on file  . Marital Status: Not on file  Intimate Partner Violence:   . Fear of Current or Ex-Partner: Not on file  . Emotionally Abused: Not on file  . Physically Abused: Not on file  . Sexually Abused: Not on file     Review of Systems    General:  No chills, fever, night sweats or weight changes.  Cardiovascular:  No chest pain, dyspnea on exertion, edema, orthopnea, palpitations, paroxysmal nocturnal dyspnea. Dermatological: No rash, lesions/masses Respiratory: No cough, dyspnea Urologic: No hematuria, dysuria Abdominal:   No nausea, vomiting, diarrhea, bright red blood per rectum, melena, or hematemesis Neurologic:  No visual changes, wkns, changes in mental status. All other systems reviewed and are otherwise negative except as noted above.  Physical Exam    VS:  BP 130/72   Pulse 62   Temp (!) 97.2 F (36.2 C)   Ht 5\' 10"  (1.778 m)   Wt 210 lb 6.4 oz (95.4 kg)   SpO2 97%   BMI 30.19 kg/m  , BMI Body mass index is 30.19 kg/m. GEN: Well nourished, well developed, in no acute distress. HEENT: normal. Neck: Supple, no JVD, carotid bruits, or masses. Cardiac: RRR, no murmurs, rubs, or gallops. No clubbing, cyanosis,  edema.  Radials/DP/PT 2+ and equal bilaterally.  Respiratory:  Respirations regular and unlabored, clear to auscultation bilaterally. GI: Soft, nontender, nondistended, BS + x 4. MS: no deformity or atrophy. Skin: warm and dry, no rash. Neuro:  Strength and sensation are intact. Psych: Normal affect.  Accessory Clinical Findings    ECG personally reviewed by me today-  -none today.  EKG 06/21/2019 Normal sinus rhythm 62 bpm  Echocardiogram 04/25/2018 TEE Result status: Final result   Tricuspid valve: Mild to moderate regurgitation. The tricuspid valve regurgitation jet is central.  Right ventricle: Normal wall thickness and ejection fraction. Cavity is mildly dilated.  Septum: No Patent Foramen Ovale present.  Left atrium: Patent foramen ovale not present.  Mitral valve: Dilated mitral annulus. Mild leaflet thickening is present. Mild leaflet calcification is present. Mild mitral annular calcification. Mild regurgitation.  Aortic valve: The valve is trileaflet. Mild valve thickening present. No stenosis. No regurgitation.     LHC 04/21/2018  Mid RCA lesion is 90% stenosed.  Post Atrio lesion is 50% stenosed.  Mid Cx lesion is 95% stenosed.  Prox LAD lesion is 75% stenosed.  The left ventricular systolic function is normal.  LV end diastolic pressure is normal.  The left ventricular ejection fraction is 55-65% by visual estimate. Diagnostic Dominance: Right     Assessment & Plan   1.  Essential hypertension-BP today  130/72.  Well-controlled at home. Continue lisinopril 5 mg daily Continue metoprolol tartrate 12.5 mg twice daily Heart healthy low-sodium diet salty 6 given Increase physical activity as tolerated Continue blood pressure log Mindfulness stress reduction   Coronary artery disease-status post CABG x3 04/25/2018.  Denies chest pain . Continue aspirin 81 mg daily Continue atorvastatin 80 mg tablet daily Heart healthy low-sodium high-fiber  diet Physical activity goal 150 minutes of moderate physical activity weekly  Hyperlipidemia-06/19/2019: Cholesterol 134; HDL 60; LDL Cholesterol (Calc) 61; Triglycerides 52 Continue atorvastatin 80 mg tablet daily Heart healthy low-sodium high-fiber diet Increase physical activity as tolerated  OSA-compliant with CPAP Followed by Dr. Claiborne Billings   Disposition: Follow-up with Dr. Stanford Breed in 6 months.  Jossie Ng. [facility] [place] [address] Office [phone]  Fax [phone]

## 2019-12-11 ENCOUNTER — Other Ambulatory Visit: Payer: Self-pay

## 2019-12-11 ENCOUNTER — Encounter: Payer: Self-pay | Admitting: General Practice

## 2019-12-11 ENCOUNTER — Ambulatory Visit (INDEPENDENT_AMBULATORY_CARE_PROVIDER_SITE_OTHER): Payer: 59 | Admitting: General Practice

## 2019-12-11 VITALS — BP 130/72 | HR 62 | Temp 97.2°F | Ht 70.0 in | Wt 210.4 lb

## 2019-12-11 DIAGNOSIS — E7800 Pure hypercholesterolemia, unspecified: Secondary | ICD-10-CM

## 2019-12-11 DIAGNOSIS — I2510 Atherosclerotic heart disease of native coronary artery without angina pectoris: Secondary | ICD-10-CM | POA: Diagnosis not present

## 2019-12-11 DIAGNOSIS — G4733 Obstructive sleep apnea (adult) (pediatric): Secondary | ICD-10-CM | POA: Diagnosis not present

## 2019-12-11 DIAGNOSIS — I10 Essential (primary) hypertension: Secondary | ICD-10-CM

## 2019-12-11 NOTE — Patient Instructions (Signed)
Special Instructions: PLEASE FOLLOW LOW POTASSIUM DIET  Reduce your risk of getting COVID-19 With your heart disease it is especially important for people at increased risk of severe illness from COVID-19, and those who live with them, to protect themselves from getting COVID-19. The best way to protect yourself and to help reduce the spread of the virus that causes COVID-19 is to: Marland Kitchen Limit your interactions with other people as much as possible. . Take precautions to prevent getting COVID-19 when you do interact with others. If you start feeling sick and think you may have COVID-19, get in touch with your healthcare provider within 24 hours.  Follow-Up: 6 months Please call our office 2 months in advance, JUN 2021 to schedule this AUG2021 appointment. In Person Olga Millers, MD.    At [facility], you and your health needs are our priority.  As part of our continuing mission to provide you with exceptional heart care, we have created designated Provider Care Teams.  These Care Teams include your primary Cardiologist (physician) and Advanced Practice Providers (APPs -  Physician Assistants and Nurse Practitioners) who all work together to provide you with the care you need, when you need it.  Thank you for choosing [facility]!!     Potassium Content of Foods  Potassium is a mineral found in many foods and drinks. It affects how the heart works, and helps keep fluids and minerals balanced in the body. The amount of potassium you need each day depends on your age and any medical conditions you may have. Talk to your health care provider or dietitian about how much potassium you need. The following lists of foods provide the general serving size for foods and the approximate amount of potassium in each serving, listed in milligrams (mg). Actual values may vary depending on the product and how it is processed. High in potassium The following foods and beverages have 200 mg or  more of potassium per serving:  Apricots (raw) - 2 have 200 mg of potassium.  Apricots (dry) - 5 have 200 mg of potassium.  Artichoke - 1 medium has 345 mg of potassium.  Avocado -  fruit has 245 mg of potassium.  Banana - 1 medium fruit has 425 mg of potassium.  Lima or baked beans (canned) -  cup has 280 mg of potassium.  White beans (canned) -  cup has 595 mg potassium.  Beef roast - 3 oz has 320 mg of potassium.  Ground beef - 3 oz has 270 mg of potassium.  Beets (raw or cooked) -  cup has 260 mg of potassium.  Bran muffin - 2 oz has 300 mg of potassium.  Broccoli (cooked) -  cup has 230 mg of potassium.  Brussels sprouts -  cup has 250 mg of potassium.  Cantaloupe -  cup has 215 mg of potassium.  Cereal, 100% bran -  cup has 200-400 mg of potassium.  Cheeseburger -1 single fast food burger has 225-400 mg of potassium.  Chicken - 3 oz has 220 mg of potassium.  Clams (canned) - 3 oz has 535 mg of potassium.  Crab - 3 oz has 225 mg of potassium.  Dates - 5 have 270 mg of potassium.  Dried beans and peas -  cup has 300-475 mg of potassium.  Figs (dried) - 2 have 260 mg of potassium.  Fish (halibut, tuna, cod, snapper) - 3 oz has 480 mg of potassium.  Fish (salmon, haddock, swordfish, perch) -  3 oz has 300 mg of potassium.  Fish (tuna, canned) - 3 oz has 200 mg of potassium.  [place] fries (fast food) - 3 oz has 470 mg of potassium.  Granola with fruit and nuts -  cup has 200 mg of potassium.  Grapefruit juice -  cup has 200 mg of potassium.  Honeydew melon -  cup has 200 mg of potassium.  Kale (raw) - 1 cup has 300 mg of potassium.  Kiwi - 1 medium fruit has 240 mg of potassium.  Kohlrabi, rutabaga, parsnips -  cup has 280 mg of potassium.  Lentils -  cup has 365 mg of potassium.  Mango - 1 each has 325 mg of potassium.  Milk (nonfat, low-fat, whole, buttermilk) - 1 cup has 350-380 mg of potassium.  Milk (chocolate) - 1 cup has 420  mg of potassium  Molasses - 1 Tbsp has 295 mg of potassium.  Mushrooms -  cup has 280 mg of potassium.  Nectarine - 1 each has 275 mg of potassium.  Nuts (almonds, peanuts, hazelnuts, [place], cashew, mixed) - 1 oz has 200 mg of potassium.  Nuts (pistachios) - 1 oz has 295 mg of potassium.  Orange - 1 fruit has 240 mg of potassium.  Orange juice -  cup has 235 mg of potassium.  Papaya -  medium fruit has 390 mg of potassium.  Peanut butter (chunky) - 2 Tbsp has 240 mg of potassium.  Peanut butter (smooth) - 2 Tbsp has 210 mg of potassium.  Pear - 1 medium (200 mg) of potassium.  Pomegranate - 1 whole fruit has 400 mg of potassium.  Pomegranate juice -  cup has 215 mg of potassium.  Pork - 3 oz has 350 mg of potassium.  Potato chips (salted) - 1 oz has 465 mg of potassium.  Potato (baked with skin) - 1 medium has 925 mg of potassium.  Potato (boiled) -  cup has 255 mg of potassium.  Potato (Mashed) -  cup has 330 mg of potassium.  Prune juice -  cup has 370 mg of potassium.  Prunes - 5 have 305 mg of potassium.  Pudding (chocolate) -  cup has 230 mg of potassium.  Pumpkin (canned) -  cup has 250 mg of potassium.  Raisins (seedless) -  cup has 270 mg of potassium.  Seeds (sunflower or pumpkin) - 1 oz has 240 mg of potassium.  Soy milk - 1 cup has 300 mg of potassium.  Spinach (cooked) - 1/2 cup has 420 mg of potassium.  Spinach (canned) -  cup has 370 mg of potassium.  Sweet potato (baked with skin) - 1 medium has 450 mg of potassium.  Swiss chard -  cup has 480 mg of potassium.  Tomato or vegetable juice -  cup has 275 mg of potassium.  Tomato (sauce or puree) -  cup has 400-550 mg of potassium.  Tomato (raw) - 1 medium has 290 mg of potassium.  Tomato (canned) -  cup has 200-300 mg of potassium.  [place] - 3 oz has 250 mg of potassium.  Wheat germ - 1 oz has 250 mg of potassium.  Winter squash -  cup has 250 mg of  potassium.  Yogurt (plain or fruited) - 6 oz has 260-435 mg of potassium.  Zucchini -  cup has 220 mg of potassium. Moderate in potassium The following foods and beverages have 50-200 mg of potassium per serving:  Apple - 1 fruit has 150 mg  of potassium  Apple juice -  cup has 150 mg of potassium  Applesauce -  cup has 90 mg of potassium  Apricot nectar -  cup has 140 mg of potassium  Asparagus (small spears) -  cup has 155 mg of potassium  Asparagus (large spears) - 6 have 155 mg of potassium  Bagel (cinnamon raisin) - 1 four-inch bagel has 130 mg of potassium  Bagel (egg or plain) - 1 four- inch bagel has 70 mg of potassium  Beans (green) -  cup has 90 mg of potassium  Beans (yellow) -  cup has 190 mg of potassium  Beer, regular - 12 oz has 100 mg of potassium  Beets (canned) -  cup has 125 mg of potassium  Blackberries -  cup has 115 mg of potassium  Blueberries -  cup has 60 mg of potassium  Bread (whole wheat) - 1 slice has 70 mg of potassium  Broccoli (raw) -  cup has 145 mg of potassium  Cabbage -  cup has 150 mg of potassium  Carrots (cooked or raw) -  cup has 180 mg of potassium  Cauliflower (raw) -  cup has 150 mg of potassium  Celery (raw) -  cup has 155 mg of potassium  Cereal, bran flakes -  cup has 120-150 mg of potassium  Cheese (cottage) -  cup has 110 mg of potassium  Cherries - 10 have 150 mg of potassium  Chocolate - 1 oz bar has 165 mg of potassium  Coffee (brewed) - 6 oz has 90 mg of potassium  Corn -  cup or 1 ear has 195 mg of potassium  Cucumbers -  cup has 80 mg of potassium  Egg - 1 large egg has 60 mg of potassium  Eggplant -  cup has 60 mg of potassium  Endive (raw) -  cup has 80 mg of potassium  English muffin - 1 has 65 mg of potassium  Fish (ocean perch) - 3 oz has 192 mg of potassium  Frankfurter, beef or pork - 1 has 75 mg of potassium  Fruit cocktail -  cup has 115 mg of  potassium  Grape juice -  cup has 170 mg of potassium  Grapefruit -  fruit has 175 mg of potassium  Grapes -  cup has 155 mg of potassium  Greens: kale, turnip, collard -  cup has 110-150 mg of potassium  Ice cream or frozen yogurt (chocolate) -  cup has 175 mg of potassium  Ice cream or frozen yogurt (vanilla) -  cup has 120-150 mg of potassium  Lemons, limes - 1 each has 80 mg of potassium  Lettuce - 1 cup has 100 mg of potassium  Mixed vegetables -  cup has 150 mg of potassium  Mushrooms, raw -  cup has 110 mg of potassium  Nuts (walnuts, pecans, or macadamia) - 1 oz has 125 mg of potassium  Oatmeal -  cup has 80 mg of potassium  Okra -  cup has 110 mg of potassium  Onions -  cup has 120 mg of potassium  Peach - 1 has 185 mg of potassium  Peaches (canned) -  cup has 120 mg of potassium  Pears (canned) -  cup has 120 mg of potassium  Peas, green (frozen) -  cup has 90 mg of potassium  Peppers (Green) -  cup has 130 mg of potassium  Peppers (Red) -  cup has 160 mg of potassium  Pineapple  juice -  cup has 165 mg of potassium  Pineapple (fresh or canned) -  cup has 100 mg of potassium  Plums - 1 has 105 mg of potassium  Pudding, vanilla -  cup has 150 mg of potassium  Raspberries -  cup has 90 mg of potassium  Rhubarb -  cup has 115 mg of potassium  Rice, wild -  cup has 80 mg of potassium  Shrimp - 3 oz has 155 mg of potassium  Spinach (raw) - 1 cup has 170 mg of potassium  Strawberries -  cup has 125 mg of potassium  Summer squash -  cup has 175-200 mg of potassium  Swiss chard (raw) - 1 cup has 135 mg of potassium  Tangerines - 1 fruit has 140 mg of potassium  Tea, brewed - 6 oz has 65 mg of potassium  Turnips -  cup has 140 mg of potassium  Watermelon -  cup has 85 mg of potassium  Wine (Red, table) - 5 oz has 180 mg of potassium  Wine (White, table) - 5 oz 100 mg of potassium Low in potassium The following  foods and beverages have less than 50 mg of potassium per serving.  Bread (white) - 1 slice has 30 mg of potassium  Carbonated beverages - 12 oz has less than 5 mg of potassium  Cheese - 1 oz has 20-30 mg of potassium  Cranberries -  cup has 45 mg of potassium  Cranberry juice cocktail -  cup has 20 mg of potassium  Fats and oils - 1 Tbsp has less than 5 mg of potassium  Hummus - 1 Tbsp has 32 mg of potassium  Nectar (papaya, mango, or pear) -  cup has 35 mg of potassium  Rice (white or brown) -  cup has 50 mg of potassium  Spaghetti or macaroni (cooked) -  cup has 30 mg of potassium  Tortilla, flour or corn - 1 has 50 mg of potassium  Waffle - 1 four-inch waffle has 50 mg of potassium  Water chestnuts -  cup has 40 mg of potassium Summary  Potassium is a mineral found in many foods and drinks. It affects how the heart works, and helps keep fluids and minerals balanced in the body.  The amount of potassium you need each day depends on your age and any existing medical conditions you may have. Your health care provider or dietitian may recommend an amount of potassium that you should have each day. This information is not intended to replace advice given to you by your health care provider. Make sure you discuss any questions you have with your health care provider.

## 2019-12-25 ENCOUNTER — Other Ambulatory Visit: Payer: 59 | Admitting: Internal Medicine

## 2019-12-25 ENCOUNTER — Other Ambulatory Visit: Payer: Self-pay

## 2019-12-25 DIAGNOSIS — R7302 Impaired glucose tolerance (oral): Secondary | ICD-10-CM

## 2019-12-25 DIAGNOSIS — E7800 Pure hypercholesterolemia, unspecified: Secondary | ICD-10-CM

## 2019-12-28 ENCOUNTER — Encounter: Payer: Self-pay | Admitting: Internal Medicine

## 2019-12-28 ENCOUNTER — Ambulatory Visit (INDEPENDENT_AMBULATORY_CARE_PROVIDER_SITE_OTHER): Payer: 59 | Admitting: Internal Medicine

## 2019-12-28 ENCOUNTER — Other Ambulatory Visit: Payer: Self-pay

## 2019-12-28 VITALS — BP 100/70 | HR 70 | Temp 98.0°F | Ht 70.0 in | Wt 208.0 lb

## 2019-12-28 DIAGNOSIS — Z951 Presence of aortocoronary bypass graft: Secondary | ICD-10-CM

## 2019-12-28 DIAGNOSIS — E7800 Pure hypercholesterolemia, unspecified: Secondary | ICD-10-CM | POA: Diagnosis not present

## 2019-12-28 DIAGNOSIS — E875 Hyperkalemia: Secondary | ICD-10-CM

## 2019-12-28 DIAGNOSIS — I252 Old myocardial infarction: Secondary | ICD-10-CM

## 2019-12-28 DIAGNOSIS — R7302 Impaired glucose tolerance (oral): Secondary | ICD-10-CM

## 2019-12-28 DIAGNOSIS — G4733 Obstructive sleep apnea (adult) (pediatric): Secondary | ICD-10-CM

## 2019-12-28 NOTE — Patient Instructions (Signed)
Recent elevated serum potassium. This will be rechecked today. Follow up here in 6 months. No change in meds.

## 2019-12-28 NOTE — Progress Notes (Signed)
   Subjective:    Patient ID: Aycen Porreca, male    DOB: 01/29/1961, 59 y.o.   MRN: [id number]  HPI  59 year old Male for 6 month recheck.  History of NSTEMI June 2019.  He underwent cardiac cath by Dr. Gery Pray and had a mid RCA lesion 90% stenosed, mid circumflex lesion 95% stenosed proximal LAD 75% stenosed.  Subsequently underwent CABG x3 to the LAD, posterior descending coronary artery, distal circumflex, and has done well since that time.  Has been diagnosed with sleep apnea.  Postoperatively he was iron deficient and this has been corrected.  He enjoys playing golf.  History of hyperlipidemia and is maintained on statin medication.  Lipid panel and liver functions are normal.  History of impaired glucose tolerance and hemoglobin A1c 6.1%.  He currently is on Metformin 500 mg daily.  He is on Lipitor 80 mg daily and Zestril 5 mg daily.  Takes aspirin 81 mg daily.    Review of Systems no chest pain or shortness of breath     Objective:   Physical Exam Gained 4 pounds since CPE August.  Vital signs reviewed Blood pressure 100/70, pulse 70, temperature 98 degrees orally pulse oximetry 98% weight 208 pounds BMI 29.84.  Neck is supple without JVD thyromegaly or carotid bruits.  Chest clear to auscultation.  Cardiac exam regular rate and rhythm normal S1 and S2 without murmurs or gallops.  No lower extremity edema.      Assessment & Plan:  Coronary artery disease status post STEMI and CABG x3  Hyperlipidemia-stable on Lipitor 80 mg daily  Impaired glucose tolerance-stable on Metformin  History of sleep apnea  His potassium is slightly elevated at 5.6.  Was 5.3 in January.  I do not think this is significant.  It could be slightly hemolyzed specimen in transport.  We can recheck serum potassium.  Otherwise follow-up in 6 months.  Plan: Continue low-fat, low-calorie diet and exercise.  Continue current medications.  Return in 6 months.

## 2019-12-29 LAB — HEMOGLOBIN A1C
Hgb A1c MFr Bld: 6.1 % of total Hgb — ABNORMAL HIGH (ref <5.7)
Mean Plasma Glucose: 128 (calc)
eAG (mmol/L): 7.1 (calc)

## 2019-12-29 LAB — LIPID PANEL
Cholesterol: 114 mg/dL (ref <200)
HDL: 50 mg/dL (ref >=40)
LDL Cholesterol (Calc): 51 mg/dL (calc)
Non-HDL Cholesterol (Calc): 64 mg/dL (calc) (ref <130)
Total CHOL/HDL Ratio: 2.3 (calc) (ref <5.0)
Triglycerides: 58 mg/dL (ref <150)

## 2019-12-29 LAB — HEPATIC FUNCTION PANEL
AG Ratio: 2 (calc) (ref 1.0–2.5)
ALT: 19 U/L (ref 9–46)
AST: 17 U/L (ref 10–35)
Albumin: 4.4 g/dL (ref 3.6–5.1)
Alkaline phosphatase (APISO): 55 U/L (ref 35–144)
Bilirubin, Direct: 0.3 mg/dL — ABNORMAL HIGH (ref 0.0–0.2)
Globulin: 2.2 g/dL (calc) (ref 1.9–3.7)
Indirect Bilirubin: 0.8 mg/dL (calc) (ref 0.2–1.2)
Total Bilirubin: 1.1 mg/dL (ref 0.2–1.2)
Total Protein: 6.6 g/dL (ref 6.1–8.1)

## 2019-12-29 LAB — POTASSIUM: Potassium: 5.6 mmol/L — ABNORMAL HIGH (ref 3.5–5.3)

## 2019-12-29 LAB — TEST AUTHORIZATION

## 2020-01-29 ENCOUNTER — Other Ambulatory Visit: Payer: Self-pay | Admitting: Internal Medicine

## 2020-04-01 ENCOUNTER — Other Ambulatory Visit: Payer: Self-pay | Admitting: Internal Medicine

## 2020-04-01 ENCOUNTER — Other Ambulatory Visit: Payer: Self-pay | Admitting: General Practice

## 2020-04-01 DIAGNOSIS — I10 Essential (primary) hypertension: Secondary | ICD-10-CM

## 2020-04-01 NOTE — Telephone Encounter (Signed)
Due for CPE August. Please schedule before refilling

## 2020-04-06 ENCOUNTER — Other Ambulatory Visit: Payer: Self-pay | Admitting: Internal Medicine

## 2020-05-30 NOTE — Progress Notes (Signed)
HPI: FU CAD.Patient suffered a non-ST elevation myocardial infarction June 2019. Cardiac catheterization at that time revealed severe three-vessel coronary artery disease and normal LV function. Echocardiogram June 2019 showed normal LV function, mild diastolic dysfunction, mild to moderate mitral regurgitation, moderate biatrial enlargement and mild to moderate tricuspid regurgitation. Preoperative carotid Dopplers showed 1 to 39% bilateral stenosis. Patient had coronary artery bypass graft on April 25, 2018 with a LIMA to the LAD, saphenous vein graft to the PDA and saphenous vein graft to the circumflex. Had postoperative atrial fibrillation treated with amiodarone. Also with OSA followed by Dr Tresa Endo. Since last seen,the patient has dyspnea with more extreme activities but not with routine activities. It is relieved with rest. It is not associated with chest pain. There is no orthopnea, PND or pedal edema. There is no syncope or palpitations. There is no exertional chest pain.   Current Outpatient Medications  Medication Sig Dispense Refill  . ACCU-CHEK GUIDE test strip CHECK GLUCOSE DAILY 100 strip 4  . acetaminophen (TYLENOL) 325 MG tablet Take 325 mg by mouth every 6 (six) hours as needed.    Marland Kitchen aspirin EC 81 MG tablet Take 81 mg by mouth daily.    Marland Kitchen atorvastatin (LIPITOR) 80 MG tablet TAKE 1 TABLET BY MOUTH DAILY AT 6 PM. 90 tablet 0  . lisinopril (ZESTRIL) 5 MG tablet TAKE 1 TABLET BY MOUTH EVERY DAY 90 tablet 2  . metFORMIN (GLUCOPHAGE) 500 MG tablet TAKE 1 TABLET BY MOUTH EVERY DAY WITH A MEAL 90 tablet 1  . metoprolol tartrate (LOPRESSOR) 25 MG tablet TAKE 1/2 TABLET(12.5 MG TOTAL) BY MOUTH 2 (TWO) TIMES DAILY. 90 tablet 0   No current facility-administered medications for this visit.     Past Medical History:  Diagnosis Date  . Hyperlipemia     Past Surgical History:  Procedure Laterality Date  . CORONARY ARTERY BYPASS GRAFT N/A 04/23/2018   Procedure: CORONARY  ARTERY BYPASS GRAFTING (CABG) x3 using the right greater saphenous vein harvested endoscopically and the left internal mammary artery. LIMA to LAD, SVG to Distal Circ, SVG to PD;  Surgeon: Delight Ovens, MD;  Location: [facility] OR;  Service: Open Heart Surgery;  Laterality: N/A;  . LEFT HEART CATH AND CORONARY ANGIOGRAPHY N/A 04/21/2018   Procedure: LEFT HEART CATH AND CORONARY ANGIOGRAPHY;  Surgeon: Runell Gess, MD;  Location: [facility] INVASIVE CV LAB;  Service: Cardiovascular;  Laterality: N/A;  . TEE WITHOUT CARDIOVERSION N/A 04/23/2018   Procedure: TRANSESOPHAGEAL ECHOCARDIOGRAM (TEE);  Surgeon: Delight Ovens, MD;  Location: [facility] OR;  Service: Open Heart Surgery;  Laterality: N/A;    Social History   Socioeconomic History  . Marital status: Married    Spouse name: Not on file  . Number of children: Not on file  . Years of education: Not on file  . Highest education level: Not on file  Occupational History  . Not on file  Tobacco Use  . Smoking status: Former Smoker    Types: Cigarettes    Start date: 10/26/1982    Quit date: 04/20/2018    Years since quitting: 2.1  . Smokeless tobacco: Never Used  . Tobacco comment: smoked about 20 cigarrets every 2 days.   Substance and Sexual Activity  . Alcohol use: Yes    Comment: socially   . Drug use: Never  . Sexual activity: Yes  Other Topics Concern  . Not on file  Social History Narrative  . Not on file  Social Determinants of Health   Financial Resource Strain:   . Difficulty of Paying Living Expenses:   Food Insecurity:   . Worried About Programme researcher, broadcasting/film/video in the Last Year:   . Barista in the Last Year:   Transportation Needs:   . Freight forwarder (Medical):   Marland Kitchen Lack of Transportation (Non-Medical):   Physical Activity:   . Days of Exercise per Week:   . Minutes of Exercise per Session:   Stress:   . Feeling of Stress :   Social Connections:   . Frequency of Communication with Friends and Family:   .  Frequency of Social Gatherings with Friends and Family:   . Attends Religious Services:   . Active Member of Clubs or Organizations:   . Attends Banker Meetings:   Marland Kitchen Marital Status:   Intimate Partner Violence:   . Fear of Current or Ex-Partner:   . Emotionally Abused:   Marland Kitchen Physically Abused:   . Sexually Abused:     Family History  Problem Relation Age of Onset  . CAD Father   . Stroke Father   . CAD Brother   . Aortic stenosis Brother     ROS: no fevers or chills, productive cough, hemoptysis, dysphasia, odynophagia, melena, hematochezia, dysuria, hematuria, rash, seizure activity, orthopnea, PND, pedal edema, claudication. Remaining systems are negative.  Physical Exam: Well-developed well-nourished in no acute distress.  Skin is warm and dry.  HEENT is normal.  Neck is supple.  Chest is clear to auscultation with normal expansion.  Cardiovascular exam is regular rate and rhythm.  Abdominal exam nontender or distended. No masses palpated. Extremities show no edema. neuro grossly intact  ECG-normal sinus rhythm at a rate of 67, no ST changes.  Personally reviewed  A/P  1 coronary artery disease status post coronary artery bypass graft-patient denies chest pain.  Continue medical therapy with aspirin and statin.  2 hypertension-blood pressure controlled.  Continue present medical regimen.   3 hyperlipidemia-continue statin.  4 obstructive sleep apnea-Per Dr. Tresa Endo.  5 history of mild to moderate mitral and tricuspid regurgitation-repeat echocardiogram.  Olga Millers, MD

## 2020-06-12 ENCOUNTER — Encounter: Payer: Self-pay | Admitting: Cardiology

## 2020-06-12 ENCOUNTER — Ambulatory Visit (INDEPENDENT_AMBULATORY_CARE_PROVIDER_SITE_OTHER): Payer: 59 | Admitting: Cardiology

## 2020-06-12 ENCOUNTER — Other Ambulatory Visit: Payer: Self-pay

## 2020-06-12 VITALS — BP 140/72 | HR 67 | Ht 70.0 in | Wt 214.1 lb

## 2020-06-12 DIAGNOSIS — I340 Nonrheumatic mitral (valve) insufficiency: Secondary | ICD-10-CM

## 2020-06-12 DIAGNOSIS — I2510 Atherosclerotic heart disease of native coronary artery without angina pectoris: Secondary | ICD-10-CM

## 2020-06-12 DIAGNOSIS — E7800 Pure hypercholesterolemia, unspecified: Secondary | ICD-10-CM | POA: Diagnosis not present

## 2020-06-12 DIAGNOSIS — I10 Essential (primary) hypertension: Secondary | ICD-10-CM | POA: Diagnosis not present

## 2020-06-12 NOTE — Patient Instructions (Signed)
Medication Instructions:   NO CHANGE  *If you need a refill on your cardiac medications before your next appointment, please call your pharmacy*   Lab Work: If you have labs (blood work) drawn today and your tests are completely normal, you will receive your results only by: Marland Kitchen MyChart Message (if you have MyChart) OR . A paper copy in the mail If you have any lab test that is abnormal or we need to change your treatment, we will call you to review the results.   Testing/Procedures:  Your physician has requested that you have an echocardiogram. Echocardiography is a painless test that uses sound waves to create images of your heart. It provides your doctor with information about the size and shape of your heart and how well your heart's chambers and valves are working. This procedure takes approximately one hour. There are no restrictions for this procedure.[place] [facility]     Follow-Up: At [facility], you and your health needs are our priority.  As part of our continuing mission to provide you with exceptional heart care, we have created designated Provider Care Teams.  These Care Teams include your primary Cardiologist (physician) and Advanced Practice Providers (APPs -  Physician Assistants and Nurse Practitioners) who all work together to provide you with the care you need, when you need it.  We recommend signing up for the patient portal called "MyChart".  Sign up information is provided on this After Visit Summary.  MyChart is used to connect with patients for Virtual Visits (Telemedicine).  Patients are able to view lab/test results, encounter notes, upcoming appointments, etc.  Non-urgent messages can be sent to your provider as well.   To learn more about what you can do with MyChart, go to [email or web address].    Your next appointment:   12 month(s)  The format for your next appointment:   In Person  Provider:   Olga Millers, MD

## 2020-06-13 ENCOUNTER — Telehealth: Payer: Self-pay | Admitting: Cardiology

## 2020-06-13 NOTE — Telephone Encounter (Signed)
    Pt would like to get EKG result

## 2020-06-13 NOTE — Telephone Encounter (Signed)
Spoke to pt. He report he had an appointment yesterday but forgot to ask about his EKG.  Nurse informed that per chart review, Dr. Jens Som noted ECG-normal sinus rhythm at a rate of 67, no ST changes. Pt updated and verbalized understanding.

## 2020-06-18 ENCOUNTER — Other Ambulatory Visit: Payer: 59 | Admitting: Internal Medicine

## 2020-06-18 ENCOUNTER — Other Ambulatory Visit: Payer: Self-pay

## 2020-06-18 DIAGNOSIS — Z125 Encounter for screening for malignant neoplasm of prostate: Secondary | ICD-10-CM

## 2020-06-18 DIAGNOSIS — Z951 Presence of aortocoronary bypass graft: Secondary | ICD-10-CM

## 2020-06-18 DIAGNOSIS — Z0000 Encounter for general adult medical examination without abnormal findings: Secondary | ICD-10-CM

## 2020-06-18 DIAGNOSIS — I252 Old myocardial infarction: Secondary | ICD-10-CM

## 2020-06-18 DIAGNOSIS — E7800 Pure hypercholesterolemia, unspecified: Secondary | ICD-10-CM

## 2020-06-18 DIAGNOSIS — G4733 Obstructive sleep apnea (adult) (pediatric): Secondary | ICD-10-CM

## 2020-06-18 DIAGNOSIS — R7302 Impaired glucose tolerance (oral): Secondary | ICD-10-CM

## 2020-06-19 LAB — CBC WITH DIFFERENTIAL/PLATELET
Absolute Monocytes: 893 cells/uL (ref 200–950)
Basophils Absolute: 23 cells/uL (ref 0–200)
Basophils Relative: 0.3 %
Eosinophils Absolute: 60 cells/uL (ref 15–500)
Eosinophils Relative: 0.8 %
HCT: 40.6 % (ref 38.5–50.0)
Hemoglobin: 13.9 g/dL (ref 13.2–17.1)
Lymphs Abs: 1995 cells/uL (ref 850–3900)
MCH: 30.5 pg (ref 27.0–33.0)
MCHC: 34.2 g/dL (ref 32.0–36.0)
MCV: 89.2 fL (ref 80.0–100.0)
MPV: 10 fL (ref 7.5–12.5)
Monocytes Relative: 11.9 %
Neutro Abs: 4530 cells/uL (ref 1500–7800)
Neutrophils Relative %: 60.4 %
Platelets: 310 10*3/uL (ref 140–400)
RBC: 4.55 10*6/uL (ref 4.20–5.80)
RDW: 12.9 % (ref 11.0–15.0)
Total Lymphocyte: 26.6 %
WBC: 7.5 10*3/uL (ref 3.8–10.8)

## 2020-06-19 LAB — LIPID PANEL
Cholesterol: 128 mg/dL (ref <200)
HDL: 53 mg/dL (ref >=40)
LDL Cholesterol (Calc): 61 mg/dL (calc)
Non-HDL Cholesterol (Calc): 75 mg/dL (calc) (ref <130)
Total CHOL/HDL Ratio: 2.4 (calc) (ref <5.0)
Triglycerides: 68 mg/dL (ref <150)

## 2020-06-19 LAB — COMPLETE METABOLIC PANEL WITH GFR
AG Ratio: 2.1 (calc) (ref 1.0–2.5)
ALT: 21 U/L (ref 9–46)
AST: 19 U/L (ref 10–35)
Albumin: 4.6 g/dL (ref 3.6–5.1)
Alkaline phosphatase (APISO): 55 U/L (ref 35–144)
BUN: 20 mg/dL (ref 7–25)
CO2: 25 mmol/L (ref 20–32)
Calcium: 9.5 mg/dL (ref 8.6–10.3)
Chloride: 100 mmol/L (ref 98–110)
Creat: 1.01 mg/dL (ref 0.70–1.25)
GFR, Est African American: 93 mL/min/{1.73_m2} (ref >=60)
GFR, Est Non African American: 80 mL/min/{1.73_m2} (ref >=60)
Globulin: 2.2 g/dL (calc) (ref 1.9–3.7)
Glucose, Bld: 112 mg/dL — ABNORMAL HIGH (ref 65–99)
Potassium: 5.5 mmol/L — ABNORMAL HIGH (ref 3.5–5.3)
Sodium: 136 mmol/L (ref 135–146)
Total Bilirubin: 1.3 mg/dL — ABNORMAL HIGH (ref 0.2–1.2)
Total Protein: 6.8 g/dL (ref 6.1–8.1)

## 2020-06-19 LAB — HEMOGLOBIN A1C
Hgb A1c MFr Bld: 6.1 % of total Hgb — ABNORMAL HIGH (ref <5.7)
Mean Plasma Glucose: 128 (calc)
eAG (mmol/L): 7.1 (calc)

## 2020-06-19 LAB — PSA: PSA: 2.3 ng/mL (ref <=4.0)

## 2020-06-21 ENCOUNTER — Other Ambulatory Visit: Payer: Self-pay

## 2020-06-21 ENCOUNTER — Other Ambulatory Visit: Payer: 59 | Admitting: Internal Medicine

## 2020-06-21 DIAGNOSIS — E875 Hyperkalemia: Secondary | ICD-10-CM

## 2020-06-21 LAB — POTASSIUM: Potassium: 5.2 mmol/L (ref 3.5–5.3)

## 2020-06-21 MED ORDER — METOPROLOL TARTRATE 25 MG PO TABS
12.5000 mg | ORAL_TABLET | Freq: Two times a day (BID) | ORAL | 0 refills | Status: DC
Start: 2020-06-21 — End: 2020-09-17

## 2020-06-25 ENCOUNTER — Other Ambulatory Visit: Payer: Self-pay

## 2020-06-25 ENCOUNTER — Encounter: Payer: Self-pay | Admitting: Internal Medicine

## 2020-06-25 ENCOUNTER — Ambulatory Visit (INDEPENDENT_AMBULATORY_CARE_PROVIDER_SITE_OTHER): Payer: 59 | Admitting: Internal Medicine

## 2020-06-25 VITALS — BP 110/70 | HR 80 | Ht 70.0 in | Wt 210.0 lb

## 2020-06-25 DIAGNOSIS — G4733 Obstructive sleep apnea (adult) (pediatric): Secondary | ICD-10-CM

## 2020-06-25 DIAGNOSIS — E7800 Pure hypercholesterolemia, unspecified: Secondary | ICD-10-CM | POA: Diagnosis not present

## 2020-06-25 DIAGNOSIS — Z951 Presence of aortocoronary bypass graft: Secondary | ICD-10-CM

## 2020-06-25 DIAGNOSIS — Z0000 Encounter for general adult medical examination without abnormal findings: Secondary | ICD-10-CM

## 2020-06-25 DIAGNOSIS — R7302 Impaired glucose tolerance (oral): Secondary | ICD-10-CM | POA: Diagnosis not present

## 2020-06-25 DIAGNOSIS — Z23 Encounter for immunization: Secondary | ICD-10-CM

## 2020-06-25 DIAGNOSIS — I252 Old myocardial infarction: Secondary | ICD-10-CM

## 2020-06-25 LAB — POCT URINALYSIS DIPSTICK
Appearance: NEGATIVE
Bilirubin, UA: NEGATIVE
Blood, UA: NEGATIVE
Glucose, UA: NEGATIVE
Ketones, UA: NEGATIVE
Leukocytes, UA: NEGATIVE
Nitrite, UA: NEGATIVE
Odor: NEGATIVE
Protein, UA: NEGATIVE
Spec Grav, UA: 1.01 (ref 1.010–1.025)
Urobilinogen, UA: 0.2 E.U./dL
pH, UA: 6.5 (ref 5.0–8.0)

## 2020-06-25 NOTE — Progress Notes (Signed)
   Subjective:    Patient ID: Zachary [name], male    DOB: 1961-05-13, 59 y.o.   MRN: [id number]  HPI  Pleasant 59 year old Male for health maintenance exam and evaluation of medical issues.   Patient was hospitalized with an NSTEMI on April 20, 2018.  On April 21, 2018 he underwent cardiac cath by Dr. Gery Pray.  He had mid RCA lesion 90% stenosed, mid circumflex lesion 95% stenosed and proximal LAD 75% stenosis.  Subsequently underwent coronary artery bypass grafting x3 to the LAD, posterior descending coronary artery, distal circumflex.  He has done well since that time.  He went to cardiac rehab.  He has been diagnosed with obstructive sleep apnea.  Postoperatively he was iron deficient and that was corrected with oral iron supplementation.  Past medical history remarkable for hyperlipidemia.  In February 2020 he had Herpes zoster without complication treated with Valtrex.  Social history: History of smoking cigarettes but quit smoking.  Social alcohol consumption.  2 adult children, son and a daughter.  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  He is married.  Family history: Aortic stenosis in his brother.  Coronary artery disease in brother and father.  Stroke in father.  No known drug allergies.  History of sleep apnea treated with CPAP by Dr. Tresa Endo.  History of left tibia fracture in 1983.  Old records from [facility] office indicates he had Zostavax vaccine in December 2016.  He had vitamin D deficiency at that time with level being 21.7.  He also had a normal PSA at that time at 1.91.  He had an LDL down of 148 fasting glucose was 113.  Tetanus immunization update given in 2020.  He gets annual flu vaccine.  He has had 2 maternal vaccines in March and April of this year.  Flu vaccine given today.  Review of Systems he feels well and has no new complaints.  He exercises and plays golf.     Objective:   Physical Exam Vital signs reviewed.  Blood pressure 110/70 pulse 80  pulse oximetry 97% weight 210 pounds height 5 feet 10 inches BMI 30.13  Skin warm and dry.  No cervical adenopathy.  No carotid bruits.  Chest clear to auscultation.  Cardiac exam regular rate and rhythm normal S1 and S2 without murmurs or gallops.  Abdomen soft nondistended without hepatosplenomegaly masses or tenderness.  Prostate is normal.  No lower extremity edema.  Neuro intact without focal deficits.  Affect thought and judgment are normal.       Assessment & Plan:  History of CABG x3 status post NSTEMI in June 2019 and doing well  Hyperlipidemia treated with statin-lipid panel is normal  History of sleep apnea  Impaired glucose tolerance-hemoglobin A1c 6.1% on Metformin alone.  Health maintenance-PSA is normal.  Inquire about colonoscopy.  Plan: He seems to be doing quite well.  Continue current medications.  Continue exercise program.  Flu vaccine given.  Return in 6 months.  No change in medications.

## 2020-06-26 ENCOUNTER — Other Ambulatory Visit: Payer: Self-pay

## 2020-06-26 MED ORDER — VALACYCLOVIR HCL 1 G PO TABS: 1000.0000 mg | ORAL_TABLET | Freq: Two times a day (BID) | ORAL | 11 refills | Status: DC

## 2020-06-26 NOTE — Telephone Encounter (Signed)
Patient called to request a refill on valtrex 1000mg  he said he got a fever blister on his lip he woke up like that.

## 2020-07-08 ENCOUNTER — Ambulatory Visit (HOSPITAL_BASED_OUTPATIENT_CLINIC_OR_DEPARTMENT_OTHER)
Admission: RE | Admit: 2020-07-08 | Discharge: 2020-07-08 | Disposition: A | Discharge status: Home / Self Care | Payer: 59 | Source: Ambulatory Visit | Attending: Cardiology | Admitting: Cardiology

## 2020-07-08 ENCOUNTER — Other Ambulatory Visit: Payer: Self-pay

## 2020-07-08 DIAGNOSIS — I340 Nonrheumatic mitral (valve) insufficiency: Secondary | ICD-10-CM

## 2020-07-08 LAB — ECHOCARDIOGRAM COMPLETE
Area-P 1/2: 3.85 cm2
S' Lateral: 2.77 cm

## 2020-07-08 NOTE — Progress Notes (Signed)
  Echocardiogram 2D Echocardiogram has been performed with 3D  Renato Gails 07/08/2020, 11:19 AM

## 2020-07-13 ENCOUNTER — Encounter: Payer: Self-pay | Admitting: Internal Medicine

## 2020-07-13 NOTE — Patient Instructions (Signed)
It was a pleasure to see you today.  Lab work is within normal limits.  Continue diet and exercise regimen.  Flu vaccine given today.  Follow-up in 6 months.

## 2020-07-18 ENCOUNTER — Other Ambulatory Visit: Payer: Self-pay | Admitting: Internal Medicine

## 2020-08-25 ENCOUNTER — Other Ambulatory Visit: Payer: Self-pay | Admitting: Internal Medicine

## 2020-09-17 ENCOUNTER — Other Ambulatory Visit: Payer: Self-pay | Admitting: Internal Medicine

## 2020-11-05 ENCOUNTER — Other Ambulatory Visit: Payer: Self-pay

## 2020-11-05 MED ORDER — METFORMIN HCL 500 MG PO TABS: ORAL_TABLET | ORAL | 1 refills | Status: DC

## 2020-11-05 MED ORDER — VALACYCLOVIR HCL 1 G PO TABS
1000.0000 mg | ORAL_TABLET | Freq: Two times a day (BID) | ORAL | 11 refills | Status: AC
Start: 2020-11-05 — End: ?

## 2020-11-06 ENCOUNTER — Other Ambulatory Visit: Payer: Self-pay

## 2020-11-06 DIAGNOSIS — I10 Essential (primary) hypertension: Secondary | ICD-10-CM

## 2020-11-06 MED ORDER — METOPROLOL TARTRATE 25 MG PO TABS: 12.5000 mg | ORAL_TABLET | Freq: Two times a day (BID) | ORAL | 3 refills | Status: DC

## 2020-11-06 MED ORDER — LISINOPRIL 5 MG PO TABS: 5.0000 mg | ORAL_TABLET | Freq: Every day | ORAL | 3 refills | Status: DC

## 2020-12-13 ENCOUNTER — Telehealth: Payer: Self-pay | Admitting: Internal Medicine

## 2020-12-13 MED ORDER — ATORVASTATIN CALCIUM 80 MG PO TABS: 80.0000 mg | ORAL_TABLET | Freq: Every day | ORAL | 3 refills | Status: DC

## 2020-12-13 NOTE — Telephone Encounter (Signed)
Refill x 6 months 

## 2020-12-13 NOTE — Telephone Encounter (Signed)
Received Fax RX request from  [facility]  Pharmacy - [facility] -  [phone] is phone number [phone] is fax number  Medication - atorvastatin (LIPITOR) 80 MG tablet   Last Refill -  Last OV - 06/25/2020  Last CPE - 06/25/2020  Next Appointment - 12/31/2020

## 2020-12-15 ENCOUNTER — Other Ambulatory Visit: Payer: Self-pay | Admitting: Internal Medicine

## 2020-12-30 ENCOUNTER — Other Ambulatory Visit: Payer: Self-pay

## 2020-12-30 ENCOUNTER — Other Ambulatory Visit: Payer: 59 | Admitting: Internal Medicine

## 2020-12-30 DIAGNOSIS — E7800 Pure hypercholesterolemia, unspecified: Secondary | ICD-10-CM

## 2020-12-30 DIAGNOSIS — R7302 Impaired glucose tolerance (oral): Secondary | ICD-10-CM

## 2020-12-31 ENCOUNTER — Encounter: Payer: Self-pay | Admitting: Internal Medicine

## 2020-12-31 ENCOUNTER — Ambulatory Visit (INDEPENDENT_AMBULATORY_CARE_PROVIDER_SITE_OTHER): Payer: 59 | Admitting: Internal Medicine

## 2020-12-31 VITALS — BP 120/80 | HR 60 | Ht 70.0 in | Wt 220.0 lb

## 2020-12-31 DIAGNOSIS — I252 Old myocardial infarction: Secondary | ICD-10-CM

## 2020-12-31 DIAGNOSIS — E7800 Pure hypercholesterolemia, unspecified: Secondary | ICD-10-CM

## 2020-12-31 DIAGNOSIS — G4733 Obstructive sleep apnea (adult) (pediatric): Secondary | ICD-10-CM

## 2020-12-31 DIAGNOSIS — I5189 Other ill-defined heart diseases: Secondary | ICD-10-CM

## 2020-12-31 DIAGNOSIS — Z951 Presence of aortocoronary bypass graft: Secondary | ICD-10-CM | POA: Diagnosis not present

## 2020-12-31 DIAGNOSIS — R7302 Impaired glucose tolerance (oral): Secondary | ICD-10-CM

## 2020-12-31 DIAGNOSIS — Z8639 Personal history of other endocrine, nutritional and metabolic disease: Secondary | ICD-10-CM

## 2020-12-31 LAB — CBC WITH DIFFERENTIAL/PLATELET
Absolute Monocytes: 855 cells/uL (ref 200–950)
Basophils Absolute: 30 cells/uL (ref 0–200)
Basophils Relative: 0.4 %
Eosinophils Absolute: 90 cells/uL (ref 15–500)
Eosinophils Relative: 1.2 %
HCT: 41.3 % (ref 38.5–50.0)
Hemoglobin: 13.7 g/dL (ref 13.2–17.1)
Lymphs Abs: 2453 cells/uL (ref 850–3900)
MCH: 29.1 pg (ref 27.0–33.0)
MCHC: 33.2 g/dL (ref 32.0–36.0)
MCV: 87.7 fL (ref 80.0–100.0)
MPV: 9.9 fL (ref 7.5–12.5)
Monocytes Relative: 11.4 %
Neutro Abs: 4073 cells/uL (ref 1500–7800)
Neutrophils Relative %: 54.3 %
Platelets: 339 10*3/uL (ref 140–400)
RBC: 4.71 10*6/uL (ref 4.20–5.80)
RDW: 13.1 % (ref 11.0–15.0)
Total Lymphocyte: 32.7 %
WBC: 7.5 10*3/uL (ref 3.8–10.8)

## 2020-12-31 LAB — LIPID PANEL
Cholesterol: 125 mg/dL (ref <200)
HDL: 48 mg/dL (ref >=40)
LDL Cholesterol (Calc): 63 mg/dL (calc)
Non-HDL Cholesterol (Calc): 77 mg/dL (calc) (ref <130)
Total CHOL/HDL Ratio: 2.6 (calc) (ref <5.0)
Triglycerides: 63 mg/dL (ref <150)

## 2020-12-31 LAB — HEMOGLOBIN A1C
Hgb A1c MFr Bld: 6.1 % of total Hgb — ABNORMAL HIGH (ref <5.7)
Mean Plasma Glucose: 128 mg/dL
eAG (mmol/L): 7.1 mmol/L

## 2021-01-09 ENCOUNTER — Telehealth: Payer: Self-pay

## 2021-01-09 ENCOUNTER — Other Ambulatory Visit: Payer: Self-pay

## 2021-01-09 MED ORDER — ALPRAZOLAM 0.5 MG PO TABS: ORAL_TABLET | ORAL | 1 refills | Status: AC

## 2021-01-09 NOTE — Telephone Encounter (Signed)
Please sign
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
## 2021-01-12 DIAGNOSIS — Z8639 Personal history of other endocrine, nutritional and metabolic disease: Secondary | ICD-10-CM | POA: Insufficient documentation

## 2021-01-12 DIAGNOSIS — R7302 Impaired glucose tolerance (oral): Secondary | ICD-10-CM | POA: Insufficient documentation

## 2021-01-12 DIAGNOSIS — I5189 Other ill-defined heart diseases: Secondary | ICD-10-CM | POA: Insufficient documentation

## 2021-01-12 DIAGNOSIS — I252 Old myocardial infarction: Secondary | ICD-10-CM | POA: Insufficient documentation

## 2021-01-12 NOTE — Progress Notes (Signed)
   Subjective:    Patient ID: Zachary [name], male    DOB: 05-Dec-1960, 60 y.o.   MRN: [id number]  HPI 60 year old Male seen for 38-month follow-up.  He has a history of CABG x3 status post NSTEMI June 2019 and doing well.  He has a history of hyperlipidemia and impaired glucose tolerance.  History of sleep apnea.  Has not exercised quite as much during the winter as he usually does.  Weight in August 2021 was 210 pounds and is now 220 pounds.  He has had 2 Moderna COVID vaccines.  Had flu vaccine in August.  Tetanus immunization is up-to-date.  Will be playing more golf later this spring.  He had 2D echocardiogram by Dr. Jens Som in September 2021 which was essentially normal with the exception of grade 1 diastolic dysfunction.  He is on Zestril 5 mg daily for this.  Review of Systems  Constitutional: Negative.   Respiratory: Negative for chest tightness and shortness of breath.   Cardiovascular: Negative for chest pain and palpitations.       Objective:   Physical Exam Blood pressure is excellent at 120/80 pulse 60 regular pulse oximetry 98% weight 220 pounds BMI 31.57  Skin: Warm and dry.  No cervical adenopathy.  No carotid bruits.  No thyromegaly.  Chest is clear to auscultation.  Cardiac exam: Regular rate and rhythm normal S1 and S2 without murmurs or gallops.  No lower extremity pitting edema.       Assessment & Plan:  Impaired glucose tolerance- hemoglobin A1c stable at 6.1%.  He is on Metformin 500 mg daily with a meal.  We will continue with the same dose and he will work on diet and exercise.  Hyperlipidemia-he is on Lipitor 80 mg daily and lipid panel is completely normal.  History of grade 1 diastolic dysfunction treated with Zestril by Cardiology  History of CABG after presenting with NSTEMI and is stable with no recurrence of chest pain  History of sleep apnea  Plan: He will continue with current medications and work on diet and exercise.  He will follow-up in 6  months for health maintenance exam.

## 2021-01-12 NOTE — Patient Instructions (Signed)
It was a pleasure to see you today.  You have gained 10 pounds since last visit.  Please work on diet exercise and weight loss.  Hemoglobin A1c is stable at 6.1% on Metformin.  Lipid panel is normal on Lipitor 80 mg daily.  Continue with current medications and follow-up in 6 months.

## 2021-04-17 ENCOUNTER — Other Ambulatory Visit: Payer: Self-pay | Admitting: Internal Medicine

## 2021-05-06 ENCOUNTER — Other Ambulatory Visit: Payer: Self-pay

## 2021-05-06 ENCOUNTER — Telehealth (INDEPENDENT_AMBULATORY_CARE_PROVIDER_SITE_OTHER): Payer: 59 | Admitting: Internal Medicine

## 2021-05-06 ENCOUNTER — Telehealth: Payer: Self-pay | Admitting: Internal Medicine

## 2021-05-06 ENCOUNTER — Encounter: Payer: Self-pay | Admitting: Internal Medicine

## 2021-05-06 VITALS — BP 127/74 | HR 65 | Temp 97.7°F

## 2021-05-06 DIAGNOSIS — G4733 Obstructive sleep apnea (adult) (pediatric): Secondary | ICD-10-CM | POA: Diagnosis not present

## 2021-05-06 DIAGNOSIS — U071 COVID-19: Secondary | ICD-10-CM | POA: Diagnosis not present

## 2021-05-06 DIAGNOSIS — Z951 Presence of aortocoronary bypass graft: Secondary | ICD-10-CM | POA: Diagnosis not present

## 2021-05-06 DIAGNOSIS — E7800 Pure hypercholesterolemia, unspecified: Secondary | ICD-10-CM

## 2021-05-06 DIAGNOSIS — I252 Old myocardial infarction: Secondary | ICD-10-CM

## 2021-05-06 DIAGNOSIS — R7302 Impaired glucose tolerance (oral): Secondary | ICD-10-CM | POA: Diagnosis not present

## 2021-05-06 NOTE — Progress Notes (Signed)
   Subjective:    Patient ID: Zachary [name], male    DOB: 1961/01/15, 60 y.o.   MRN: [id number]  HPI 60 year old Male seen today via interactive audio and video telecommunications due to the Coronavirus pandemic.  Patient is agreeable to visit in this format today.  Patient is at his home and I am in my office.  Patient stated that he started having flulike symptoms on Friday, July 8.  He had been traveling some and was likely exposed.  Home COVID test was positive on Saturday, July 9.  He did not call the office until July 12.  He is having hand and chest congestion, cough, myalgias and fatigue.  Has been taking Mucinex DM and Advil.  Patient has had 2 COVID-19 vaccines and 1 booster.  He is accompanied by his wife, [name] on the video visit. He has a history of NSTEMI June 2019.  Cardiac cath revealed significant three-vessel disease and he had coronary artery bypass graft x3 shortly thereafter.  He has done well since that time.  History of hyperlipidemia.  Former smoker but has quit.  History of sleep apnea treated with CPAP.  History of impaired glucose tolerance treated with metformin  No known drug allergies.   Review of Systems see above regarding symptoms.  Also complaining of dry throat.     Objective:   Physical Exam He reports his temperature is 97.7, blood pressure 127/74 and pulse 65 He is seen virtually and is in no acute distress.  He looks fatigued and not as energetic as usual.  His color is good.  He does not appear to be tachypneic.  His wife is present.  Records search indicate GFR in August 2021 was 80 cc/min.  This research is required for Paxlovid therapy.     Assessment & Plan:   Acute COVID-19 virus infection.  History of heart disease status post CABG and stable  Hyperlipidemia-treated with statin  Impaired glucose tolerance treated with metformin  Plan: Given that he has acute COVID-19 and several medical issues we are going to place him on Paxlovid.  His  renal functions are normal and he can take regular dose.  Wife is not ill at this time but will call if she develops symptoms.  Time spent researching his past medical history, interviewing patient, medical decision making including use of Paxlovid, faxing prescription to pharmacy, checking GFR as a requirement of paxlovid is 20 minutes

## 2021-05-06 NOTE — Telephone Encounter (Signed)
scheduled

## 2021-05-06 NOTE — Telephone Encounter (Signed)
Zachary [name] [phone]  Zachary [name] called to say he started feeling flu like last Friday Saturday so he did Home COVID test and it was positive on 05/03/2021. He is having head and chest congestion, cough, achy and fatigue has been able to keep a lot of symptoms down by taking Mucinex DM and Advil, no fever, has had 2 vaccines and 1 booster.

## 2021-05-25 ENCOUNTER — Encounter: Payer: Self-pay | Admitting: Internal Medicine

## 2021-05-25 NOTE — Patient Instructions (Signed)
We are sorry you are not feeling well.  A prescription for pack Slo-Bid has been sent to pharmacy.  You have been given regular doses your kidney functions are normal.  You will take Nirmatrelvir 150 mg 2 tablets twice daily and ritonavir 100 mg 1 tablet twice daily

## 2021-07-07 ENCOUNTER — Other Ambulatory Visit: Payer: Self-pay

## 2021-07-07 ENCOUNTER — Other Ambulatory Visit: Payer: 59 | Admitting: Internal Medicine

## 2021-07-07 DIAGNOSIS — R7302 Impaired glucose tolerance (oral): Secondary | ICD-10-CM

## 2021-07-07 DIAGNOSIS — Z0000 Encounter for general adult medical examination without abnormal findings: Secondary | ICD-10-CM

## 2021-07-07 DIAGNOSIS — E7800 Pure hypercholesterolemia, unspecified: Secondary | ICD-10-CM

## 2021-07-08 ENCOUNTER — Ambulatory Visit (INDEPENDENT_AMBULATORY_CARE_PROVIDER_SITE_OTHER): Payer: 59 | Admitting: Internal Medicine

## 2021-07-08 ENCOUNTER — Encounter: Payer: Self-pay | Admitting: Internal Medicine

## 2021-07-08 VITALS — BP 122/82 | HR 62 | Ht 70.0 in | Wt 220.0 lb

## 2021-07-08 DIAGNOSIS — Z0000 Encounter for general adult medical examination without abnormal findings: Secondary | ICD-10-CM

## 2021-07-08 DIAGNOSIS — E7800 Pure hypercholesterolemia, unspecified: Secondary | ICD-10-CM

## 2021-07-08 DIAGNOSIS — R7302 Impaired glucose tolerance (oral): Secondary | ICD-10-CM | POA: Diagnosis not present

## 2021-07-08 DIAGNOSIS — Z23 Encounter for immunization: Secondary | ICD-10-CM

## 2021-07-08 DIAGNOSIS — Z951 Presence of aortocoronary bypass graft: Secondary | ICD-10-CM

## 2021-07-08 DIAGNOSIS — G4733 Obstructive sleep apnea (adult) (pediatric): Secondary | ICD-10-CM | POA: Diagnosis not present

## 2021-07-08 DIAGNOSIS — I252 Old myocardial infarction: Secondary | ICD-10-CM

## 2021-07-08 LAB — POCT URINALYSIS DIPSTICK
Bilirubin, UA: NEGATIVE
Blood, UA: NEGATIVE
Glucose, UA: NEGATIVE
Ketones, UA: NEGATIVE
Leukocytes, UA: NEGATIVE
Nitrite, UA: NEGATIVE
Protein, UA: NEGATIVE
Spec Grav, UA: 1.015 (ref 1.010–1.025)
Urobilinogen, UA: 0.2 E.U./dL
pH, UA: 5.5 (ref 5.0–8.0)

## 2021-07-08 LAB — CBC WITH DIFFERENTIAL/PLATELET
Absolute Monocytes: 836 cells/uL (ref 200–950)
Basophils Absolute: 18 cells/uL (ref 0–200)
Basophils Relative: 0.2 %
Eosinophils Absolute: 62 cells/uL (ref 15–500)
Eosinophils Relative: 0.7 %
HCT: 43.1 % (ref 38.5–50.0)
Hemoglobin: 14.2 g/dL (ref 13.2–17.1)
Lymphs Abs: 2332 cells/uL (ref 850–3900)
MCH: 29.7 pg (ref 27.0–33.0)
MCHC: 32.9 g/dL (ref 32.0–36.0)
MCV: 90.2 fL (ref 80.0–100.0)
MPV: 10.1 fL (ref 7.5–12.5)
Monocytes Relative: 9.5 %
Neutro Abs: 5553 cells/uL (ref 1500–7800)
Neutrophils Relative %: 63.1 %
Platelets: 345 10*3/uL (ref 140–400)
RBC: 4.78 10*6/uL (ref 4.20–5.80)
RDW: 12.9 % (ref 11.0–15.0)
Total Lymphocyte: 26.5 %
WBC: 8.8 10*3/uL (ref 3.8–10.8)

## 2021-07-08 LAB — COMPLETE METABOLIC PANEL WITH GFR
AG Ratio: 1.8 (calc) (ref 1.0–2.5)
ALT: 20 U/L (ref 9–46)
AST: 17 U/L (ref 10–35)
Albumin: 4.6 g/dL (ref 3.6–5.1)
Alkaline phosphatase (APISO): 54 U/L (ref 35–144)
BUN: 18 mg/dL (ref 7–25)
CO2: 26 mmol/L (ref 20–32)
Calcium: 9.8 mg/dL (ref 8.6–10.3)
Chloride: 99 mmol/L (ref 98–110)
Creat: 1.05 mg/dL (ref 0.70–1.35)
Globulin: 2.5 g/dL (calc) (ref 1.9–3.7)
Glucose, Bld: 105 mg/dL — ABNORMAL HIGH (ref 65–99)
Potassium: 4.9 mmol/L (ref 3.5–5.3)
Sodium: 136 mmol/L (ref 135–146)
Total Bilirubin: 1.2 mg/dL (ref 0.2–1.2)
Total Protein: 7.1 g/dL (ref 6.1–8.1)
eGFR: 80 mL/min/{1.73_m2} (ref >=60)

## 2021-07-08 LAB — LIPID PANEL
Cholesterol: 134 mg/dL (ref <200)
HDL: 51 mg/dL (ref >=40)
LDL Cholesterol (Calc): 64 mg/dL (calc)
Non-HDL Cholesterol (Calc): 83 mg/dL (calc) (ref <130)
Total CHOL/HDL Ratio: 2.6 (calc) (ref <5.0)
Triglycerides: 100 mg/dL (ref <150)

## 2021-07-08 LAB — PSA: PSA: 3.06 ng/mL (ref <=4.00)

## 2021-07-08 LAB — TSH: TSH: 1.8 mIU/L (ref 0.40–4.50)

## 2021-07-08 LAB — HEMOGLOBIN A1C
Hgb A1c MFr Bld: 6.1 % of total Hgb — ABNORMAL HIGH (ref <5.7)
Mean Plasma Glucose: 128 mg/dL
eAG (mmol/L): 7.1 mmol/L

## 2021-07-08 NOTE — Patient Instructions (Addendum)
Needs colonosocpy at [facility].  Recommended metformin 500 mg daily and follow-up in March 2023.  Continue current medications.  Flu vaccine given today.  Continue to watch diet and get exercise.  It was a pleasure to see you today.
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
## 2021-07-22 NOTE — Progress Notes (Signed)
HPI: FU CAD.  Patient suffered a non-ST elevation myocardial infarction June 2019.  Cardiac catheterization at that time revealed severe three-vessel coronary artery disease and normal LV function.  Preoperative carotid Dopplers showed 1 to 39% bilateral stenosis.  Patient had coronary artery bypass graft on April 25, 2018 with a LIMA to the LAD, saphenous vein graft to the PDA and saphenous vein graft to the circumflex.  Had postoperative atrial fibrillation treated with amiodarone. Also with OSA followed by Dr Tresa Endo.  Patient had a chest CT in February 2020 and small right lung nodule at 6 mm noted.  Follow-up recommended.  Echocardiogram September 2021 showed normal LV function, grade 1 diastolic dysfunction.  Since last seen, there is no dyspnea, chest pain, palpitations or syncope.  Current Outpatient Medications  Medication Sig Dispense Refill   ACCU-CHEK GUIDE test strip CHECK GLUCOSE DAILY 100 strip 4   acetaminophen (TYLENOL) 325 MG tablet Take 325 mg by mouth every 6 (six) hours as needed.     ALPRAZolam (XANAX) 0.5 MG tablet Take 1 to 2 tabs three times a day 30 tablet 1   aspirin EC 81 MG tablet Take 81 mg by mouth daily.     atorvastatin (LIPITOR) 80 MG tablet Take 1 tablet (80 mg total) by mouth daily. 90 tablet 3   lisinopril (ZESTRIL) 5 MG tablet Take 1 tablet (5 mg total) by mouth daily. 90 tablet 3   metFORMIN (GLUCOPHAGE) 500 MG tablet TAKE 1 TABLET DAILY WITH A MEAL 90 tablet 1   metoprolol tartrate (LOPRESSOR) 25 MG tablet TAKE 0.5 TABLETS (12.5 MG TOTAL) BY MOUTH 2 (TWO) TIMES DAILY. 90 tablet 3   valACYclovir (VALTREX) 1000 MG tablet Take 1 tablet (1,000 mg total) by mouth 2 (two) times daily. 20 tablet 11   No current facility-administered medications for this visit.     Past Medical History:  Diagnosis Date   Hyperlipemia     Past Surgical History:  Procedure Laterality Date   CORONARY ARTERY BYPASS GRAFT N/A 04/23/2018   Procedure: CORONARY ARTERY BYPASS  GRAFTING (CABG) x3 using the right greater saphenous vein harvested endoscopically and the left internal mammary artery. LIMA to LAD, SVG to Distal Circ, SVG to PD;  Surgeon: Delight Ovens, MD;  Location: [facility] OR;  Service: Open Heart Surgery;  Laterality: N/A;   LEFT HEART CATH AND CORONARY ANGIOGRAPHY N/A 04/21/2018   Procedure: LEFT HEART CATH AND CORONARY ANGIOGRAPHY;  Surgeon: Runell Gess, MD;  Location: [facility] IN[facility]AS[facility];  Service: Cardiovascular;  Laterality: N/A;   TEE WITHOUT CARDIOVERSION N/A 04/23/2018   Procedure: TRANSESOPHAGEAL ECHOCARDIOGRAM (TEE);  Surgeon: Delight Ovens, MD;  Location: [facility] OR;  Service: Open Heart Surgery;  Laterality: N/A;    Social History   Socioeconomic History   Marital status: Married    Spouse name: Not on file   Number of children: Not on file   Years of education: Not on file   Highest education level: Not on file  Occupational History   Not on file  Tobacco Use   Smoking status: Former    Types: Cigarettes    Start date: 10/26/1982    Quit date: 04/20/2018    Years since quitting: 3.2   Smokeless tobacco: Never   Tobacco comments:    smoked about 20 cigarrets every 2 days.   Substance and Sexual Activity   Alcohol use: Yes    Comment: socially    Drug use: Never   Sexual  activity: Yes  Other Topics Concern   Not on file  Social History Narrative   Not on file   Social Determinants of Health   Financial Resource Strain: Not on file  Food Insecurity: Not on file  Transportation Needs: Not on file  Physical Activity: Not on file  Stress: Not on file  Social Connections: Not on file  Intimate Partner Violence: Not on file    Family History  Problem Relation Age of Onset   CAD Father    Stroke Father    CAD Brother    Aortic stenosis Brother     ROS: no fevers or chills, productive cough, hemoptysis, dysphasia, odynophagia, melena, hematochezia, dysuria, hematuria, rash, seizure activity, orthopnea, PND, pedal  edema, claudication. Remaining systems are negative.  Physical Exam: Well-developed well-nourished in no acute distress.  Skin is warm and dry.  HEENT is normal.  Neck is supple.  Chest is clear to auscultation with normal expansion.  Cardiovascular exam is regular rate and rhythm.  Abdominal exam nontender or distended. No masses palpated. Extremities show no edema. neuro grossly intact  ECG-normal sinus rhythm at a rate of 74, no significant ST changes.  Personally reviewed  A/P  1 coronary artery disease status post coronary artery bypass graft-patient doing well from a symptomatic standpoint with no chest pain.  Continue aspirin and statin.  2 hypertension-patient's blood pressure is controlled.  Continue present medical regimen.  3 hyperlipidemia-continue statin.  4 obstructive sleep apnea-managed by Dr. Tresa Endo.  5 lung nodule-we will arrange follow-up noncontrast chest CT.  Olga Millers, MD

## 2021-07-30 ENCOUNTER — Ambulatory Visit (INDEPENDENT_AMBULATORY_CARE_PROVIDER_SITE_OTHER): Payer: 59 | Admitting: Cardiology

## 2021-07-30 ENCOUNTER — Other Ambulatory Visit: Payer: Self-pay

## 2021-07-30 ENCOUNTER — Encounter: Payer: Self-pay | Admitting: Cardiology

## 2021-07-30 VITALS — BP 126/82 | HR 74 | Ht 70.0 in | Wt 222.8 lb

## 2021-07-30 DIAGNOSIS — I2510 Atherosclerotic heart disease of native coronary artery without angina pectoris: Secondary | ICD-10-CM | POA: Diagnosis not present

## 2021-07-30 DIAGNOSIS — I10 Essential (primary) hypertension: Secondary | ICD-10-CM | POA: Diagnosis not present

## 2021-07-30 DIAGNOSIS — E7800 Pure hypercholesterolemia, unspecified: Secondary | ICD-10-CM | POA: Diagnosis not present

## 2021-07-30 DIAGNOSIS — R918 Other nonspecific abnormal finding of lung field: Secondary | ICD-10-CM | POA: Diagnosis not present

## 2021-07-30 NOTE — Patient Instructions (Signed)
  Testing/Procedures:  CT OF THE CHEST WO CONTRAST TO FOLLOW UP LUNG NODULE AT THE [place] [facility]   Follow-Up: At [facility], you and your health needs are our priority.  As part of our continuing mission to provide you with exceptional heart care, we have created designated Provider Care Teams.  These Care Teams include your primary Cardiologist (physician) and Advanced Practice Providers (APPs -  Physician Assistants and Nurse Practitioners) who all work together to provide you with the care you need, when you need it.  We recommend signing up for the patient portal called "MyChart".  Sign up information is provided on this After Visit Summary.  MyChart is used to connect with patients for Virtual Visits (Telemedicine).  Patients are able to view lab/test results, encounter notes, upcoming appointments, etc.  Non-urgent messages can be sent to your provider as well.   To learn more about what you can do with MyChart, go to [email or web address].    Your next appointment:   12 month(s)  The format for your next appointment:   In Person  Provider:   Olga Millers, MD

## 2021-08-27 ENCOUNTER — Ambulatory Visit (HOSPITAL_BASED_OUTPATIENT_CLINIC_OR_DEPARTMENT_OTHER)
Admission: RE | Admit: 2021-08-27 | Discharge: 2021-08-27 | Disposition: A | Discharge status: Home / Self Care | Payer: 59 | Source: Ambulatory Visit | Attending: Cardiology | Admitting: Cardiology

## 2021-08-27 ENCOUNTER — Other Ambulatory Visit: Payer: Self-pay

## 2021-08-27 DIAGNOSIS — R918 Other nonspecific abnormal finding of lung field: Secondary | ICD-10-CM

## 2021-09-26 ENCOUNTER — Other Ambulatory Visit: Payer: Self-pay | Admitting: Cardiology

## 2021-09-26 ENCOUNTER — Other Ambulatory Visit: Payer: Self-pay | Admitting: Internal Medicine

## 2021-09-26 DIAGNOSIS — I10 Essential (primary) hypertension: Secondary | ICD-10-CM

## 2021-10-01 ENCOUNTER — Telehealth (INDEPENDENT_AMBULATORY_CARE_PROVIDER_SITE_OTHER): Payer: 59 | Admitting: Internal Medicine

## 2021-10-01 ENCOUNTER — Other Ambulatory Visit: Payer: Self-pay

## 2021-10-01 ENCOUNTER — Telehealth: Payer: Self-pay | Admitting: Internal Medicine

## 2021-10-01 ENCOUNTER — Encounter: Payer: Self-pay | Admitting: Internal Medicine

## 2021-10-01 VITALS — BP 125/75 | Temp 97.7°F

## 2021-10-01 DIAGNOSIS — Z951 Presence of aortocoronary bypass graft: Secondary | ICD-10-CM

## 2021-10-01 DIAGNOSIS — R7302 Impaired glucose tolerance (oral): Secondary | ICD-10-CM

## 2021-10-01 DIAGNOSIS — J22 Unspecified acute lower respiratory infection: Secondary | ICD-10-CM | POA: Diagnosis not present

## 2021-10-01 DIAGNOSIS — E7800 Pure hypercholesterolemia, unspecified: Secondary | ICD-10-CM

## 2021-10-01 DIAGNOSIS — I252 Old myocardial infarction: Secondary | ICD-10-CM

## 2021-10-01 MED ORDER — AZITHROMYCIN 250 MG PO TABS: ORAL_TABLET | ORAL | 0 refills | Status: AC

## 2021-10-01 MED ORDER — BENZONATATE 100 MG PO CAPS: 100.0000 mg | ORAL_CAPSULE | Freq: Three times a day (TID) | ORAL | 0 refills | Status: DC | PRN

## 2021-10-01 NOTE — Telephone Encounter (Signed)
Covid test was negative

## 2021-10-01 NOTE — Progress Notes (Signed)
   Subjective:    Patient ID: Zachary [name], male    DOB: 11/08/1960, 60 y.o.   MRN: [id number]  HPI 60 year old Male with history of NSTEMI in June 2019 followed by coronary artery bypass grafting x3 presents today via virtual visit with URI symptoms.  Has had sinus pressure, coryza, cough, discolored sputum production.  No fever.  Symptoms started over the weekend.  Due to the Coronavirus pandemic he is seen by interactive audio and video telecommunications today.  He is identified using 2 identifiers as Zachary [name], a patient in this practice.  He is agreeable to visit in this format today.  He is at his home and I am at my office.  He had a flu vaccine in September 2022 and has had 2 Moderna COVID vaccines in 2021 by our records although he says he has had 3 COVID vaccines.  He has a history of glucose intolerance and takes a statin medication.  Home COVID test today is negative  Review of Systems see above-no nausea vomiting or diarrhea.  Complaining of coryza, runny nose and discolored sputum production with chest congestion.     Objective:   Physical Exam  He is seen virtually in no acute distress.  He sounds nasally congested.  He does not appear to be tachypneic.  He is able to give a clear concise history.  Reports blood pressure to be 125/75 and temperature 97.7 degrees orally      Assessment & Plan:  Acute lower respiratory infection-COVID test is negative at home  History of coronary artery bypass surgery  History of NSTEMI  Plan: Tessalon Perles 100 mg up to 3 times daily as needed for cough.  Zithromax Z-PAK 2 tabs day 1 followed by 1 tab days 2 through 5.  May take Mucinex.  Rest and drink fluids.  Call if not better in 48 hours or sooner if worse.  Time spent with chart review, interviewing patient virtually and medical decision making is 20 minutes

## 2021-10-01 NOTE — Telephone Encounter (Signed)
Scheduled video visit 

## 2021-10-01 NOTE — Telephone Encounter (Signed)
Zachary [name] [phone]  Zachary [name] called to say he has cough, chest congestion, runny nose, watery eyes, and mucus that is greenish color, no fever, 3 COVID vaccines, flu shot

## 2021-11-16 NOTE — Patient Instructions (Signed)
May take Mucinex.  Take Tessalon Perles 100 mg up to 3 times daily as needed for cough.  Zithromax Z-PAK 2 tabs day 1 followed by 1 tab days 2 through 5.  Rest and drink fluids.  Call if not better in 48 hours or sooner if worse.

## 2021-11-17 ENCOUNTER — Encounter: Payer: Self-pay | Admitting: Internal Medicine

## 2022-01-05 ENCOUNTER — Other Ambulatory Visit: Payer: Self-pay

## 2022-01-05 ENCOUNTER — Other Ambulatory Visit: Payer: 59 | Admitting: Internal Medicine

## 2022-01-05 DIAGNOSIS — E7800 Pure hypercholesterolemia, unspecified: Secondary | ICD-10-CM

## 2022-01-05 DIAGNOSIS — R7302 Impaired glucose tolerance (oral): Secondary | ICD-10-CM

## 2022-01-06 ENCOUNTER — Ambulatory Visit (INDEPENDENT_AMBULATORY_CARE_PROVIDER_SITE_OTHER): Payer: 59 | Admitting: Internal Medicine

## 2022-01-06 ENCOUNTER — Encounter: Payer: Self-pay | Admitting: Internal Medicine

## 2022-01-06 VITALS — BP 120/70 | HR 64 | Temp 97.0°F | Ht 70.0 in | Wt 223.5 lb

## 2022-01-06 DIAGNOSIS — Z951 Presence of aortocoronary bypass graft: Secondary | ICD-10-CM | POA: Diagnosis not present

## 2022-01-06 DIAGNOSIS — I252 Old myocardial infarction: Secondary | ICD-10-CM

## 2022-01-06 DIAGNOSIS — R7302 Impaired glucose tolerance (oral): Secondary | ICD-10-CM

## 2022-01-06 DIAGNOSIS — E7800 Pure hypercholesterolemia, unspecified: Secondary | ICD-10-CM

## 2022-01-06 DIAGNOSIS — Z6832 Body mass index (BMI) 32.0-32.9, adult: Secondary | ICD-10-CM

## 2022-01-06 LAB — LIPID PANEL
Cholesterol: 142 mg/dL (ref <200)
HDL: 51 mg/dL (ref >=40)
LDL Cholesterol (Calc): 72 mg/dL (calc)
Non-HDL Cholesterol (Calc): 91 mg/dL (calc) (ref <130)
Total CHOL/HDL Ratio: 2.8 (calc) (ref <5.0)
Triglycerides: 100 mg/dL (ref <150)

## 2022-01-06 LAB — HEMOGLOBIN A1C
Hgb A1c MFr Bld: 6.2 % of total Hgb — ABNORMAL HIGH (ref <5.7)
Mean Plasma Glucose: 131 mg/dL
eAG (mmol/L): 7.3 mmol/L

## 2022-01-06 LAB — HEPATIC FUNCTION PANEL
AG Ratio: 1.8 (calc) (ref 1.0–2.5)
ALT: 18 U/L (ref 9–46)
AST: 16 U/L (ref 10–35)
Albumin: 4.5 g/dL (ref 3.6–5.1)
Alkaline phosphatase (APISO): 53 U/L (ref 35–144)
Bilirubin, Direct: 0.2 mg/dL (ref 0.0–0.2)
Globulin: 2.5 g/dL (calc) (ref 1.9–3.7)
Indirect Bilirubin: 1.1 mg/dL (calc) (ref 0.2–1.2)
Total Bilirubin: 1.3 mg/dL — ABNORMAL HIGH (ref 0.2–1.2)
Total Protein: 7 g/dL (ref 6.1–8.1)

## 2022-01-06 MED ORDER — METFORMIN HCL 500 MG PO TABS: 500.0000 mg | ORAL_TABLET | Freq: Two times a day (BID) | ORAL | 3 refills | Status: DC
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
## 2022-01-06 NOTE — Patient Instructions (Addendum)
Increase Metformin 500 mg twice a day.  Continue to work on diet and exercise efforts.  Continue Lipitor 80 mg daily.  Continue Zestril and metoprolol for hypertension.  Need to lose some weight.  Return in September for 82-month recheck. ?

## 2022-02-12 ENCOUNTER — Other Ambulatory Visit: Payer: Self-pay | Admitting: Internal Medicine

## 2022-02-26 ENCOUNTER — Other Ambulatory Visit: Payer: Self-pay | Admitting: Internal Medicine

## 2022-02-26 ENCOUNTER — Other Ambulatory Visit: Payer: Self-pay | Admitting: Cardiology

## 2022-02-26 NOTE — Telephone Encounter (Signed)
This was sent in for a years worth in November. He is ok on that. He states that it was automatic refill. He is fine with medications now. He is only having to take it for breakouts and has plenty.  ?

## 2022-06-22 ENCOUNTER — Telehealth: Payer: Self-pay | Admitting: Cardiology

## 2022-06-22 MED ORDER — METOPROLOL TARTRATE 25 MG PO TABS: 12.5000 mg | ORAL_TABLET | Freq: Two times a day (BID) | ORAL | 3 refills | Status: DC

## 2022-06-22 NOTE — Telephone Encounter (Signed)
*  STAT* If patient is at the pharmacy, call can be transferred to refill team.   1. Which medications need to be refilled? (please list name of each medication and dose if known)  metoprolol tartrate (LOPRESSOR) 25 MG tablet  2. Which pharmacy/location (including street and city if local pharmacy) is medication to be sent to? [id number] [place], [place] - [address]  3. Do they need a 30 day or 90 day supply? 90

## 2022-07-09 DIAGNOSIS — D692 Other nonthrombocytopenic purpura: Secondary | ICD-10-CM | POA: Diagnosis not present

## 2022-07-09 DIAGNOSIS — D1801 Hemangioma of skin and subcutaneous tissue: Secondary | ICD-10-CM | POA: Diagnosis not present

## 2022-07-09 DIAGNOSIS — L814 Other melanin hyperpigmentation: Secondary | ICD-10-CM | POA: Diagnosis not present

## 2022-07-09 DIAGNOSIS — L821 Other seborrheic keratosis: Secondary | ICD-10-CM | POA: Diagnosis not present

## 2022-07-09 DIAGNOSIS — B009 Herpesviral infection, unspecified: Secondary | ICD-10-CM | POA: Diagnosis not present

## 2022-07-09 DIAGNOSIS — X32XXXS Exposure to sunlight, sequela: Secondary | ICD-10-CM | POA: Diagnosis not present

## 2022-07-09 DIAGNOSIS — Z808 Family history of malignant neoplasm of other organs or systems: Secondary | ICD-10-CM | POA: Diagnosis not present

## 2022-07-09 DIAGNOSIS — D225 Melanocytic nevi of trunk: Secondary | ICD-10-CM | POA: Diagnosis not present

## 2022-07-13 ENCOUNTER — Other Ambulatory Visit: Payer: 59

## 2022-07-13 DIAGNOSIS — Z125 Encounter for screening for malignant neoplasm of prostate: Secondary | ICD-10-CM | POA: Diagnosis not present

## 2022-07-13 DIAGNOSIS — Z8639 Personal history of other endocrine, nutritional and metabolic disease: Secondary | ICD-10-CM

## 2022-07-13 DIAGNOSIS — E7800 Pure hypercholesterolemia, unspecified: Secondary | ICD-10-CM

## 2022-07-13 DIAGNOSIS — R7302 Impaired glucose tolerance (oral): Secondary | ICD-10-CM

## 2022-07-14 ENCOUNTER — Ambulatory Visit (INDEPENDENT_AMBULATORY_CARE_PROVIDER_SITE_OTHER): Payer: 59 | Admitting: Internal Medicine

## 2022-07-14 ENCOUNTER — Encounter: Payer: Self-pay | Admitting: Internal Medicine

## 2022-07-14 VITALS — BP 102/64 | HR 61 | Temp 97.2°F | Ht 67.25 in | Wt 219.8 lb

## 2022-07-14 DIAGNOSIS — I252 Old myocardial infarction: Secondary | ICD-10-CM

## 2022-07-14 DIAGNOSIS — Z8639 Personal history of other endocrine, nutritional and metabolic disease: Secondary | ICD-10-CM

## 2022-07-14 DIAGNOSIS — Z951 Presence of aortocoronary bypass graft: Secondary | ICD-10-CM

## 2022-07-14 DIAGNOSIS — R7302 Impaired glucose tolerance (oral): Secondary | ICD-10-CM | POA: Diagnosis not present

## 2022-07-14 DIAGNOSIS — Z6834 Body mass index (BMI) 34.0-34.9, adult: Secondary | ICD-10-CM

## 2022-07-14 DIAGNOSIS — Z0000 Encounter for general adult medical examination without abnormal findings: Secondary | ICD-10-CM | POA: Diagnosis not present

## 2022-07-14 DIAGNOSIS — E7800 Pure hypercholesterolemia, unspecified: Secondary | ICD-10-CM

## 2022-07-14 DIAGNOSIS — I5189 Other ill-defined heart diseases: Secondary | ICD-10-CM

## 2022-07-14 DIAGNOSIS — G4733 Obstructive sleep apnea (adult) (pediatric): Secondary | ICD-10-CM

## 2022-07-14 LAB — CBC WITH DIFFERENTIAL/PLATELET
Absolute Monocytes: 753 cells/uL (ref 200–950)
Basophils Absolute: 16 cells/uL (ref 0–200)
Basophils Relative: 0.2 %
Eosinophils Absolute: 41 cells/uL (ref 15–500)
Eosinophils Relative: 0.5 %
HCT: 40.1 % (ref 38.5–50.0)
Hemoglobin: 13.9 g/dL (ref 13.2–17.1)
Lymphs Abs: 2219 cells/uL (ref 850–3900)
MCH: 30.5 pg (ref 27.0–33.0)
MCHC: 34.7 g/dL (ref 32.0–36.0)
MCV: 87.9 fL (ref 80.0–100.0)
MPV: 10 fL (ref 7.5–12.5)
Monocytes Relative: 9.3 %
Neutro Abs: 5071 cells/uL (ref 1500–7800)
Neutrophils Relative %: 62.6 %
Platelets: 335 10*3/uL (ref 140–400)
RBC: 4.56 10*6/uL (ref 4.20–5.80)
RDW: 12.8 % (ref 11.0–15.0)
Total Lymphocyte: 27.4 %
WBC: 8.1 10*3/uL (ref 3.8–10.8)

## 2022-07-14 LAB — COMPLETE METABOLIC PANEL WITH GFR
AG Ratio: 2 (calc) (ref 1.0–2.5)
ALT: 17 U/L (ref 9–46)
AST: 16 U/L (ref 10–35)
Albumin: 4.5 g/dL (ref 3.6–5.1)
Alkaline phosphatase (APISO): 53 U/L (ref 35–144)
BUN: 17 mg/dL (ref 7–25)
CO2: 29 mmol/L (ref 20–32)
Calcium: 9.8 mg/dL (ref 8.6–10.3)
Chloride: 102 mmol/L (ref 98–110)
Creat: 1.07 mg/dL (ref 0.70–1.35)
Globulin: 2.3 g/dL (calc) (ref 1.9–3.7)
Glucose, Bld: 112 mg/dL — ABNORMAL HIGH (ref 65–99)
Potassium: 5.5 mmol/L — ABNORMAL HIGH (ref 3.5–5.3)
Sodium: 138 mmol/L (ref 135–146)
Total Bilirubin: 1.1 mg/dL (ref 0.2–1.2)
Total Protein: 6.8 g/dL (ref 6.1–8.1)
eGFR: 78 mL/min/{1.73_m2} (ref >=60)

## 2022-07-14 LAB — POCT URINALYSIS DIPSTICK
Bilirubin, UA: NEGATIVE
Blood, UA: NEGATIVE
Glucose, UA: NEGATIVE
Ketones, UA: NEGATIVE
Leukocytes, UA: NEGATIVE
Nitrite, UA: NEGATIVE
Protein, UA: NEGATIVE
Spec Grav, UA: 1.01 (ref 1.010–1.025)
Urobilinogen, UA: 0.2 E.U./dL
pH, UA: 5 (ref 5.0–8.0)

## 2022-07-14 LAB — HEMOGLOBIN A1C
Hgb A1c MFr Bld: 6 % of total Hgb — ABNORMAL HIGH (ref <5.7)
Mean Plasma Glucose: 126 mg/dL
eAG (mmol/L): 7 mmol/L

## 2022-07-14 LAB — PSA: PSA: 3.08 ng/mL (ref <=4.00)

## 2022-07-14 LAB — LIPID PANEL
Cholesterol: 138 mg/dL (ref <200)
HDL: 50 mg/dL (ref >=40)
LDL Cholesterol (Calc): 70 mg/dL (calc)
Non-HDL Cholesterol (Calc): 88 mg/dL (calc) (ref <130)
Total CHOL/HDL Ratio: 2.8 (calc) (ref <5.0)
Triglycerides: 97 mg/dL (ref <150)

## 2022-07-14 LAB — MICROALBUMIN, URINE: Microalb, Ur: 3.6 mg/dL

## 2022-07-14 NOTE — Progress Notes (Signed)
   Subjective:    Patient ID: Zachary [name], male    DOB: 1961/03/16, 61 y.o.   MRN: [id number]  HPI Pleasant 61 year old Male here for health maintenance exam and evaluation of medical issues.  He was hospitalized with NSTEMI on April 20, 2018.  The following day he underwent cardiac cath by Dr. Alvester Chou.  He had a mid RCA lesion 90% stenosed, mid circumflex lesion 95% stenosed proximal LAD 75% stenosed.  Subsequently underwent coronary artery bypass grafting x3 to the LAD, posterior descending coronary artery and distal circumflex.  Has done well since that time.  He went to cardiac rehab.  He has been diagnosed with obstructive sleep apnea.  Postoperatively he was iron deficient and that was corrected with oral iron supplementation.  Past medical history: History of hyperlipidemia.  In February 2020 he had Herpes zoster without complication treated with Valtrex.  History of sleep apnea treated with CPAP by Dr. Claiborne Billings.  History of left tibia fracture 1983.  No known drug allergies.  Family history: Aortic stenosis in brother.  Coronary artery disease in brother and father.  Stroke in father.  Social history: History of smoking cigarettes but has quit.  Social alcohol consumption.  He is married.  Son passed away in a motor vehicle accident.  He has 1 daughter in good health.  He recently retired from [employer].    Review of Systems feels well with no new complaints.     Objective:   Physical Exam Blood pressure excellent at 102/64 pulse 61 he is afebrile.  Pulse oximetry 99% weight 219 pounds 12.8 ounces BMI 34.17 Skin: Warm and dry.  No cervical adenopathy.  No thyromegaly.  No carotid bruits.  Chest clear to auscultation.  Cardiac exam: Regular rate and rhythm without ectopy.  Abdomen is soft, nondistended without hepatosplenomegaly masses or tenderness.  Prostate is normal without nodules.  No lower extremity pitting edema.  Affect thought and judgment are normal.      Assessment & Plan:   BMI 34.17.  He has gained some weight and needs to get back to a more reasonable BMI.  He will work on this.  Needs to be under 200 pounds.  History of sleep apnea  History of coronary artery disease status post CABG.  Is on Lipitor 80 mg daily.  He is on metoprolol 12.5 mg twice daily and lisinopril 5 mg daily.  History of mild glucose intolerance being treated with Glucophage 500 mg twice daily  History of mild to moderate mitral and tricuspid regurgitation-stable  Vaccines discussed.  He needs flu vaccine.  He can consider COVID-vaccine.  Needs to consider pneumococcal vaccine, RSV and Shingrix vaccines.  Return in 6 months for follow-up.

## 2022-07-25 NOTE — Patient Instructions (Signed)
Please work on diet exercise and weight loss.  Vaccines discussed.  Continue current medications.  It was a pleasure to see you today.

## 2022-09-02 NOTE — Progress Notes (Signed)
HPI:  FU CAD.  Patient suffered a non-ST elevation myocardial infarction June 2019.  Cardiac catheterization at that time revealed severe three-vessel coronary artery disease and normal LV function.  Preoperative carotid Dopplers showed 1 to 39% bilateral stenosis.  Patient had coronary artery bypass graft on April 25, 2018 with a LIMA to the LAD, saphenous vein graft to the PDA and saphenous vein graft to the circumflex.  Had postoperative atrial fibrillation treated with amiodarone. Also with OSA followed by Dr Tresa Endo.  Echocardiogram September 2021 showed normal LV function, grade 1 diastolic dysfunction.  Since last seen, the patient denies any dyspnea on exertion, orthopnea, PND, pedal edema, palpitations, syncope or chest pain.   Current Outpatient Medications  Medication Sig Dispense Refill   ACCU-CHEK GUIDE test strip CHECK GLUCOSE DAILY 100 strip 4   acetaminophen (TYLENOL) 325 MG tablet Take 325 mg by mouth every 6 (six) hours as needed.     aspirin EC 81 MG tablet Take 81 mg by mouth daily.     atorvastatin (LIPITOR) 80 MG tablet TAKE 1 TABLET DAILY 90 tablet 3   lisinopril (ZESTRIL) 5 MG tablet TAKE 1 TABLET DAILY 90 tablet 3   metFORMIN (GLUCOPHAGE) 500 MG tablet Take 1 tablet (500 mg total) by mouth 2 (two) times daily with a meal. 180 tablet 3   metoprolol tartrate (LOPRESSOR) 25 MG tablet Take 0.5 tablets (12.5 mg total) by mouth 2 (two) times daily. 90 tablet 3   valACYclovir (VALTREX) 1000 MG tablet Take 1 tablet (1,000 mg total) by mouth 2 (two) times daily. 20 tablet 11   ALPRAZolam (XANAX) 0.5 MG tablet Take 1 to 2 tabs three times a day (Patient not taking: Reported on 07/14/2022) 30 tablet 1   No current facility-administered medications for this visit.     Past Medical History:  Diagnosis Date   Hyperlipemia     Past Surgical History:  Procedure Laterality Date   CORONARY ARTERY BYPASS GRAFT N/A 04/23/2018   Procedure: CORONARY ARTERY BYPASS GRAFTING (CABG) x3  using the right greater saphenous vein harvested endoscopically and the left internal mammary artery. LIMA to LAD, SVG to Distal Circ, SVG to PD;  Surgeon: Delight Ovens, MD;  Location: [facility] OR;  Service: Open Heart Surgery;  Laterality: N/A;   LEFT HEART CATH AND CORONARY ANGIOGRAPHY N/A 04/21/2018   Procedure: LEFT HEART CATH AND CORONARY ANGIOGRAPHY;  Surgeon: Runell Gess, MD;  Location: [facility] INVASIVE CV LAB;  Service: Cardiovascular;  Laterality: N/A;   TEE WITHOUT CARDIOVERSION N/A 04/23/2018   Procedure: TRANSESOPHAGEAL ECHOCARDIOGRAM (TEE);  Surgeon: Delight Ovens, MD;  Location: [facility] OR;  Service: Open Heart Surgery;  Laterality: N/A;    Social History   Socioeconomic History   Marital status: Married    Spouse name: Not on file   Number of children: Not on file   Years of education: Not on file   Highest education level: Not on file  Occupational History   Not on file  Tobacco Use   Smoking status: Former    Types: Cigarettes    Start date: 10/26/1982    Quit date: 04/20/2018    Years since quitting: 4.4   Smokeless tobacco: Never   Tobacco comments:    smoked about 20 cigarrets every 2 days.   Substance and Sexual Activity   Alcohol use: Yes    Comment: socially    Drug use: Never   Sexual activity: Yes  Other Topics Concern   Not  on file  Social History Narrative   Not on file   Social Determinants of Health   Financial Resource Strain: Not on file  Food Insecurity: Not on file  Transportation Needs: Not on file  Physical Activity: Not on file  Stress: Not on file  Social Connections: Not on file  Intimate Partner Violence: Not on file    Family History  Problem Relation Age of Onset   CAD Father    Stroke Father    CAD Brother    Aortic stenosis Brother     ROS: no fevers or chills, productive cough, hemoptysis, dysphasia, odynophagia, melena, hematochezia, dysuria, hematuria, rash, seizure activity, orthopnea, PND, pedal edema, claudication.  Remaining systems are negative.  Physical Exam: Well-developed well-nourished in no acute distress.  Skin is warm and dry.  HEENT is normal.  Neck is supple.  Chest is clear to auscultation with normal expansion.  Cardiovascular exam is regular rate and rhythm.  Abdominal exam nontender or distended. No masses palpated. Extremities show no edema. neuro grossly intact  ECG-normal sinus rhythm at a rate of 82, no ST changes.  Personally reviewed  A/P  1 coronary artery disease status post coronary artery bypass graft-patient denies chest pain.  Continue medical therapy with aspirin and statin.  2 hypertension-blood pressure controlled.  Continue present medications.  3 hyperlipidemia-continue statin.  Lipids reviewed from September 2023; total cholesterol 138 with LDL 70.  Liver functions normal.  Goal LDL is 55 given history of coronary artery disease.  Add Zetia 10 mg daily.  Check lipids and liver in 8 weeks.  4 obstructive sleep apnea-Per Dr. Claiborne Billings.  Kirk Ruths, MD

## 2022-09-16 ENCOUNTER — Encounter: Payer: Self-pay | Admitting: Cardiology

## 2022-09-16 ENCOUNTER — Ambulatory Visit: Payer: 59 | Attending: Cardiology | Admitting: Cardiology

## 2022-09-16 VITALS — BP 122/68 | HR 82 | Ht 69.0 in | Wt 221.1 lb

## 2022-09-16 DIAGNOSIS — I2510 Atherosclerotic heart disease of native coronary artery without angina pectoris: Secondary | ICD-10-CM | POA: Diagnosis not present

## 2022-09-16 DIAGNOSIS — I10 Essential (primary) hypertension: Secondary | ICD-10-CM

## 2022-09-16 DIAGNOSIS — E7800 Pure hypercholesterolemia, unspecified: Secondary | ICD-10-CM | POA: Diagnosis not present

## 2022-09-16 MED ORDER — EZETIMIBE 10 MG PO TABS: 10.0000 mg | ORAL_TABLET | Freq: Every day | ORAL | 3 refills | Status: DC

## 2022-09-16 NOTE — Patient Instructions (Signed)
Medication Instructions:   EZETIMIBE 10 MG ONCE DAILY  *If you need a refill on your cardiac medications before your next appointment, please call your pharmacy*   Lab Work:  Your physician recommends that you return for lab work in: [facility]  [place] [employer]  Located on the 2 nd floor in [address] Hours- Mon-Thur 8 am-11:30 am and 1 pm - 4:30 pm             Fri-8am - 11 am   If you have labs (blood work) drawn today and your tests are completely normal, you will receive your results only by: MyChart Message (if you have MyChart) OR A paper copy in the mail If you have any lab test that is abnormal or we need to change your treatment, we will call you to review the results.   Follow-Up: At [facility], you and your health needs are our priority.  As part of our continuing mission to provide you with exceptional heart care, we have created designated Provider Care Teams.  These Care Teams include your primary Cardiologist (physician) and Advanced Practice Providers (APPs -  Physician Assistants and Nurse Practitioners) who all work together to provide you with the care you need, when you need it.  We recommend signing up for the patient portal called "MyChart".  Sign up information is provided on this After Visit Summary.  MyChart is used to connect with patients for Virtual Visits (Telemedicine).  Patients are able to view lab/test results, encounter notes, upcoming appointments, etc.  Non-urgent messages can be sent to your provider as well.   To learn more about what you can do with MyChart, go to [email or web address].    Your next appointment:   12 month(s)  The format for your next appointment:   In Person  Provider:   Olga Millers, MD

## 2022-11-05 ENCOUNTER — Other Ambulatory Visit: Payer: Self-pay | Admitting: Cardiology

## 2022-11-05 DIAGNOSIS — I10 Essential (primary) hypertension: Secondary | ICD-10-CM

## 2022-12-10 ENCOUNTER — Encounter: Payer: Self-pay | Admitting: *Deleted

## 2022-12-16 DIAGNOSIS — E7800 Pure hypercholesterolemia, unspecified: Secondary | ICD-10-CM | POA: Diagnosis not present

## 2022-12-17 LAB — LIPID PANEL
Chol/HDL Ratio: 2.5 ratio (ref 0.0–5.0)
Cholesterol, Total: 108 mg/dL (ref 100–199)
HDL: 44 mg/dL (ref >39)
LDL Chol Calc (NIH): 46 mg/dL (ref 0–99)
Triglycerides: 97 mg/dL (ref 0–149)
VLDL Cholesterol Cal: 18 mg/dL (ref 5–40)

## 2022-12-17 LAB — HEPATIC FUNCTION PANEL
ALT: 19 IU/L (ref 0–44)
AST: 18 IU/L (ref 0–40)
Albumin: 4.4 g/dL (ref 3.9–4.9)
Alkaline Phosphatase: 66 IU/L (ref 44–121)
Bilirubin Total: 0.6 mg/dL (ref 0.0–1.2)
Bilirubin, Direct: 0.16 mg/dL (ref 0.00–0.40)
Total Protein: 6.5 g/dL (ref 6.0–8.5)

## 2023-01-12 NOTE — Progress Notes (Addendum)
Patient Care Team: Elby Showers, MD as PCP - General (Internal Medicine) Lelon Perla, MD as PCP - Cardiology (Cardiology)  Visit Date: 01/19/23  Subjective:    Patient ID: Zachary [name] , Male   DOB: January 15, 1961, 62 y.o.    MRN: [id number]   62 y.o. Male presents today for a 6 month follow-up. Patient has a past medical history of hyperlipidemia, glucose intolerance, hypertension.  Reports feeling well today.  History of glucose intolerance treated with Glucophage 500 mg twice daily with a meal. HGBA1c at 6.4% on 01/18/23, up from 6.0% on 07/13/22. Was in [place] before doing blood work and did not have metformin with him, which may have contributed to the increase.  History of hyperlipidemia treated with Lipitor 80 mg daily, Zetia 10 mg daily. Lipid panel normal on 01/18/23. Followed by cardiologist, Dr. Kirk Ruths.  History of hypertension treated with Zestril 5 mg daily, Lopressor 12.5 mg twice daily. Blood pressure normal today at 124/72.   Past Medical History:  Diagnosis Date   Hyperlipemia      Family History  Problem Relation Age of Onset   CAD Father    Stroke Father    CAD Brother    Aortic stenosis Brother     Social History   Social History Narrative   Not on file      Review of Systems  Constitutional:  Negative for fever and malaise/fatigue.  HENT:  Negative for congestion.   Eyes:  Negative for blurred vision.  Respiratory:  Negative for cough and shortness of breath.   Cardiovascular:  Negative for chest pain, palpitations and leg swelling.  Gastrointestinal:  Negative for vomiting.  Musculoskeletal:  Negative for back pain.  Skin:  Negative for rash.  Neurological:  Negative for loss of consciousness and headaches.        Objective:   Vitals: BP 124/72   Pulse 64   Temp 98.4 F (36.9 C) (Tympanic)   Ht 5\' 9"  (1.753 m)   Wt 218 lb 12.8 oz (99.2 kg)   SpO2 97%   BMI 32.31 kg/m    Physical Exam Constitutional:       General: He is not in acute distress.    Appearance: Normal appearance. He is not ill-appearing.  HENT:     Head: Normocephalic and atraumatic.  Cardiovascular:     Rate and Rhythm: Normal rate and regular rhythm.     Pulses: Normal pulses.     Heart sounds: Normal heart sounds. No murmur heard.    No friction rub. No gallop.  Pulmonary:     Effort: Pulmonary effort is normal. No respiratory distress.     Breath sounds: Normal breath sounds. No wheezing or rales.  Skin:    General: Skin is warm and dry.  Neurological:     Mental Status: He is alert and oriented to person, place, and time. Mental status is at baseline.  Psychiatric:        Mood and Affect: Mood normal.        Behavior: Behavior normal.        Thought Content: Thought content normal.        Judgment: Judgment normal.       Results:   Studies obtained and personally reviewed by me:   Labs:       Component Value Date/Time   NA 138 07/13/2022 0948   NA 139 11/15/2019 0841   K 5.5 (H) 07/13/2022 0948   CL 102 07/13/2022  0948   CO2 29 07/13/2022 0948   GLUCOSE 112 (H) 07/13/2022 0948   BUN 17 07/13/2022 0948   BUN 16 11/15/2019 0841   CREATININE 1.07 07/13/2022 0948   CALCIUM 9.8 07/13/2022 0948   PROT 6.9 01/18/2023 0918   PROT 6.5 12/16/2022 0829   ALBUMIN 4.4 12/16/2022 0829   AST 17 01/18/2023 0918   ALT 18 01/18/2023 0918   ALKPHOS 66 12/16/2022 0829   BILITOT 0.8 01/18/2023 0918   BILITOT 0.6 12/16/2022 0829   GFRNONAA 80 06/18/2020 0931   GFRAA 93 06/18/2020 0931     Lab Results  Component Value Date   WBC 8.1 07/13/2022   HGB 13.9 07/13/2022   HCT 40.1 07/13/2022   MCV 87.9 07/13/2022   PLT 335 07/13/2022    Lab Results  Component Value Date   CHOL 114 01/18/2023   HDL 52 01/18/2023   LDLCALC 47 01/18/2023   TRIG 69 01/18/2023   CHOLHDL 2.2 01/18/2023    Lab Results  Component Value Date   HGBA1C 6.4 (H) 01/18/2023     Lab Results  Component Value Date   TSH 1.80  07/07/2021     Lab Results  Component Value Date   PSA 3.08 07/13/2022   PSA 3.06 07/07/2021   PSA 2.3 06/18/2020      Assessment & Plan:   Glucose intolerance: treated with Glucophage 500 mg twice daily with a meal. HGBA1c at 6.4% on 01/18/23, up from 6.0% on 07/13/22. Was in [place] before doing blood work and did not have metformin with him, which may have contributed to the increase.  Hyperlipidemia: treated with Lipitor 80 mg daily, Zetia 10 mg daily. Lipid panel normal on 01/18/23. Followed by cardiologist, Dr. Kirk Ruths.  Hypertension: treated with Zestril 5 mg daily, Lopressor 12.5 mg twice daily. Blood pressure normal today at 124/72.  Vaccine counseling: Has had first shingles vaccine. Record to be transferred.     I,Alexander Ruley,acting as a Education administrator for Elby Showers, MD.,have documented all relevant documentation on the behalf of Elby Showers, MD,as directed by  Elby Showers, MD while in the presence of Elby Showers, MD.   I, Elby Showers, MD, have reviewed all documentation for this visit. The documentation on 01/19/23 for the exam, diagnosis, procedures, and orders are all accurate and complete.

## 2023-01-18 ENCOUNTER — Other Ambulatory Visit: Payer: 59

## 2023-01-18 DIAGNOSIS — R7302 Impaired glucose tolerance (oral): Secondary | ICD-10-CM

## 2023-01-18 DIAGNOSIS — E7800 Pure hypercholesterolemia, unspecified: Secondary | ICD-10-CM | POA: Diagnosis not present

## 2023-01-19 ENCOUNTER — Encounter: Payer: Self-pay | Admitting: Internal Medicine

## 2023-01-19 ENCOUNTER — Ambulatory Visit: Payer: 59 | Admitting: Internal Medicine

## 2023-01-19 VITALS — BP 124/72 | HR 64 | Temp 98.4°F | Ht 69.0 in | Wt 218.8 lb

## 2023-01-19 DIAGNOSIS — Z6832 Body mass index (BMI) 32.0-32.9, adult: Secondary | ICD-10-CM

## 2023-01-19 DIAGNOSIS — R7302 Impaired glucose tolerance (oral): Secondary | ICD-10-CM | POA: Diagnosis not present

## 2023-01-19 DIAGNOSIS — E7800 Pure hypercholesterolemia, unspecified: Secondary | ICD-10-CM

## 2023-01-19 DIAGNOSIS — Z951 Presence of aortocoronary bypass graft: Secondary | ICD-10-CM

## 2023-01-19 LAB — HEPATIC FUNCTION PANEL
AG Ratio: 1.8 (calc) (ref 1.0–2.5)
ALT: 18 U/L (ref 9–46)
AST: 17 U/L (ref 10–35)
Albumin: 4.4 g/dL (ref 3.6–5.1)
Alkaline phosphatase (APISO): 56 U/L (ref 35–144)
Bilirubin, Direct: 0.2 mg/dL (ref 0.0–0.2)
Globulin: 2.5 g/dL (calc) (ref 1.9–3.7)
Indirect Bilirubin: 0.6 mg/dL (calc) (ref 0.2–1.2)
Total Bilirubin: 0.8 mg/dL (ref 0.2–1.2)
Total Protein: 6.9 g/dL (ref 6.1–8.1)

## 2023-01-19 LAB — LIPID PANEL
Cholesterol: 114 mg/dL (ref <200)
HDL: 52 mg/dL (ref >=40)
LDL Cholesterol (Calc): 47 mg/dL (calc)
Non-HDL Cholesterol (Calc): 62 mg/dL (calc) (ref <130)
Total CHOL/HDL Ratio: 2.2 (calc) (ref <5.0)
Triglycerides: 69 mg/dL (ref <150)

## 2023-01-19 LAB — HEMOGLOBIN A1C
Hgb A1c MFr Bld: 6.4 % of total Hgb — ABNORMAL HIGH (ref <5.7)
Mean Plasma Glucose: 137 mg/dL
eAG (mmol/L): 7.6 mmol/L

## 2023-01-19 NOTE — Patient Instructions (Signed)
It was a pleasure to see you today. Continue current meds and RTC in 6 months. Labs are stable. Please check on vaccines at local pharmacy.

## 2023-02-27 ENCOUNTER — Other Ambulatory Visit: Payer: Self-pay | Admitting: Internal Medicine

## 2023-06-05 ENCOUNTER — Other Ambulatory Visit: Payer: Self-pay | Admitting: Internal Medicine

## 2023-06-06 ENCOUNTER — Other Ambulatory Visit: Payer: Self-pay | Admitting: Cardiology

## 2023-06-08 ENCOUNTER — Other Ambulatory Visit: Payer: Self-pay

## 2023-06-08 MED ORDER — ATORVASTATIN CALCIUM 80 MG PO TABS: 80.0000 mg | ORAL_TABLET | Freq: Every day | ORAL | 1 refills | Status: DC

## 2023-07-26 DIAGNOSIS — D692 Other nonthrombocytopenic purpura: Secondary | ICD-10-CM | POA: Diagnosis not present

## 2023-07-26 DIAGNOSIS — D1801 Hemangioma of skin and subcutaneous tissue: Secondary | ICD-10-CM | POA: Diagnosis not present

## 2023-07-26 DIAGNOSIS — L821 Other seborrheic keratosis: Secondary | ICD-10-CM | POA: Diagnosis not present

## 2023-07-26 DIAGNOSIS — Z808 Family history of malignant neoplasm of other organs or systems: Secondary | ICD-10-CM | POA: Diagnosis not present

## 2023-07-26 DIAGNOSIS — L814 Other melanin hyperpigmentation: Secondary | ICD-10-CM | POA: Diagnosis not present

## 2023-07-26 NOTE — Progress Notes (Signed)
Patient Care Team: Margaree Mackintosh, MD as PCP - General (Internal Medicine) Lewayne Bunting, MD as PCP - Cardiology (Cardiology)  Visit Date: 08/02/23  Subjective:    Patient ID: Zachary [name] , Male   DOB: 02/11/1961, 62 y.o.    MRN: [id number]   63 y.o. Male presents today for annual comprehensive physical exam. History of hyperlipidemia, hypertension, impaired glucose tolerance, NSTEMI.  History of hyperlipidemia treated with atorvastatin 80 mg daily, ezetimibe 10 mg daily. Lipid panel normal.   History of hypertension treated with lisinopril 5 mg daily, metoprolol tartrate 12.5 mg twice daily. Blood pressure normal today at 110/60.  History of impaired glucose tolerance treated with metformin 500 mg twice daily with meals. HGBA1c at 6.3% on 07/29/23, down from 6.4% on 01/18/23. Says he misses a dose occasionally.  He was hospitalized with NSTEMI on April 20, 2018.  The following day he underwent cardiac cath by Dr. Gery Pray.  He had a mid RCA lesion 90% stenosed, mid circumflex lesion 95% stenosed proximal LAD 75% stenosed.  Subsequently underwent coronary artery bypass grafting x3 to the LAD, posterior descending coronary artery and distal circumflex.  Has done well since that time.  He went to cardiac rehab.  He has been diagnosed with obstructive sleep apnea.  Postoperatively he was iron deficient and that was corrected with oral iron supplementation.   Past medical history: History of hyperlipidemia.  In February 2020 he had Herpes zoster without complication treated with Valtrex.  History of sleep apnea treated with CPAP by Dr. Tresa Endo.  History of left tibia fracture 1983.   Denies feet swelling.  No known drug allergies.  Glucose slightly elevated at 103. Potassium elevated at 5.5. Total bilirubin slightly elevated at 1.3. Kidney functions normal. Blood proteins normal. CBC normal. PSA at 3.44.   Due for colonoscopy.  Family history: Aortic stenosis in brother.  Coronary artery  disease in brother and father.  Stroke in father.   Social history: History of smoking cigarettes but has quit.  Social alcohol consumption.  He is married.  Son passed away in a motor vehicle accident.  He has 1 daughter in good health.  He recently retired from [employer].  Past Medical History:  Diagnosis Date   Hyperlipemia      Family History  Problem Relation Age of Onset   CAD Father    Stroke Father    CAD Brother    Aortic stenosis Brother      Social Hx: Married. Enjoys spending time at [employer] home. Retired from [employer]. Social alcohol consumption. Smoked in the remote past.     Review of Systems  Constitutional:  Negative for chills, fever, malaise/fatigue and weight loss.  HENT:  Negative for hearing loss, sinus pain and sore throat.   Respiratory:  Negative for cough, hemoptysis and shortness of breath.   Cardiovascular:  Negative for chest pain, palpitations, leg swelling and PND.  Gastrointestinal:  Negative for abdominal pain, constipation, diarrhea, heartburn, nausea and vomiting.  Genitourinary:  Negative for dysuria, frequency and urgency.  Musculoskeletal:  Negative for back pain, myalgias and neck pain.  Skin:  Negative for itching and rash.  Neurological:  Negative for dizziness, tingling, seizures and headaches.  Endo/Heme/Allergies:  Negative for polydipsia.  Psychiatric/Behavioral:  Negative for depression. The patient is not nervous/anxious.         Objective:   Vitals: BP 110/60   Pulse 65   Ht 5\' 9"  (1.753 m)   Wt 219 lb (99.3 kg)  SpO2 98%   BMI 32.34 kg/m    Physical Exam Vitals and nursing note reviewed.  Constitutional:      General: He is awake. He is not in acute distress.    Appearance: Normal appearance. He is not ill-appearing or toxic-appearing.  HENT:     Head: Normocephalic and atraumatic.     Right Ear: Hearing, tympanic membrane, ear canal and external ear normal.     Left Ear: Hearing, tympanic membrane, ear canal  and external ear normal.     Mouth/Throat:     Pharynx: Oropharynx is clear.  Eyes:     Extraocular Movements: Extraocular movements intact.     Pupils: Pupils are equal, round, and reactive to light.  Neck:     Thyroid: No thyroid mass, thyromegaly or thyroid tenderness.     Vascular: No carotid bruit.  Cardiovascular:     Rate and Rhythm: Normal rate and regular rhythm. No extrasystoles are present.    Pulses:          Dorsalis pedis pulses are 2+ on the right side and 2+ on the left side.       Posterior tibial pulses are 1+ on the right side and 1+ on the left side.     Heart sounds: Normal heart sounds. No murmur heard.    No friction rub. No gallop.  Pulmonary:     Effort: Pulmonary effort is normal.     Breath sounds: Normal breath sounds. No decreased breath sounds, wheezing, rhonchi or rales.  Chest:     Chest wall: No mass.  Abdominal:     Palpations: Abdomen is soft.     Tenderness: There is no abdominal tenderness.     Hernia: No hernia is present.  Musculoskeletal:     Cervical back: Normal range of motion.     Right lower leg: No edema.     Left lower leg: No edema.  Lymphadenopathy:     Cervical: No cervical adenopathy.     Upper Body:     Right upper body: No supraclavicular adenopathy.     Left upper body: No supraclavicular adenopathy.  Skin:    General: Skin is warm and dry.  Neurological:     General: No focal deficit present.     Mental Status: He is alert and oriented to person, place, and time. Mental status is at baseline.     Cranial Nerves: Cranial nerves 2-12 are intact.     Sensory: Sensation is intact.     Motor: Motor function is intact.     Coordination: Coordination is intact.     Gait: Gait is intact.     Deep Tendon Reflexes: Reflexes are normal and symmetric.  Psychiatric:        Attention and Perception: Attention normal.        Mood and Affect: Mood normal.        Speech: Speech normal.        Behavior: Behavior normal. Behavior is  cooperative.        Thought Content: Thought content normal.        Cognition and Memory: Cognition and memory normal.        Judgment: Judgment normal.       Results:   Studies obtained and personally reviewed by me:   Labs:       Component Value Date/Time   NA 135 07/29/2023 0910   NA 139 11/15/2019 0841   K 5.5 (H) 07/29/2023 0910  CL 101 07/29/2023 0910   CO2 25 07/29/2023 0910   GLUCOSE 103 (H) 07/29/2023 0910   BUN 24 07/29/2023 0910   BUN 16 11/15/2019 0841   CREATININE 1.21 07/29/2023 0910   CALCIUM 9.4 07/29/2023 0910   PROT 6.7 07/29/2023 0910   PROT 6.5 12/16/2022 0829   ALBUMIN 4.4 12/16/2022 0829   AST 15 07/29/2023 0910   ALT 17 07/29/2023 0910   ALKPHOS 66 12/16/2022 0829   BILITOT 1.3 (H) 07/29/2023 0910   BILITOT 0.6 12/16/2022 0829   GFRNONAA 80 06/18/2020 0931   GFRAA 93 06/18/2020 0931     Lab Results  Component Value Date   WBC 7.5 07/29/2023   HGB 13.9 07/29/2023   HCT 42.7 07/29/2023   MCV 93.6 07/29/2023   PLT 354 07/29/2023    Lab Results  Component Value Date   CHOL 106 07/29/2023   HDL 45 07/29/2023   LDLCALC 45 07/29/2023   TRIG 77 07/29/2023   CHOLHDL 2.4 07/29/2023    Lab Results  Component Value Date   HGBA1C 6.3 (H) 07/29/2023     Lab Results  Component Value Date   TSH 1.80 07/07/2021     Lab Results  Component Value Date   PSA 3.44 07/29/2023   PSA 3.08 07/13/2022   PSA 3.06 07/07/2021      Assessment & Plan:   Hyperlipidemia: treated with atorvastatin 80 mg daily, ezetimibe 10 mg daily. Lipid panel normal.   Hypertension: treated with lisinopril 5 mg daily, metoprolol tartrate 12.5 mg twice daily. Blood pressure normal today at 110/60.  Impaired glucose tolerance: start taking metformin 1000 mg daily instead of splitting into two doses. HGBA1c at 6.3% on 07/29/23, down from 6.4% on 01/18/23.Does not want to add additional medication at this time.  History of sleep apnea treated with CPAP.   History  of coronary artery disease status post CABG.   History of mild to moderate mitral and tricuspid regurgitation- stable.  Referral to [facility] for Colonoscopy.  BMI 32- keep working on diet and exercise.  Vaccine counseling: reports he has had his second shingles dose, flu vaccine. He will consider Covid-19 booster. UTD on tetanus vaccine.  Return in 1 year or as needed.    I,Alexander Ruley,acting as a Neurosurgeon for Margaree Mackintosh, MD.,have documented all relevant documentation on the behalf of Margaree Mackintosh, MD,as directed by  Margaree Mackintosh, MD while in the presence of Margaree Mackintosh, MD.   I, Margaree Mackintosh, MD, have reviewed all documentation for this visit. The documentation on 08/02/23 for the exam, diagnosis, procedures, and orders are all accurate and complete.

## 2023-07-29 ENCOUNTER — Other Ambulatory Visit: Payer: 59

## 2023-07-29 DIAGNOSIS — E7800 Pure hypercholesterolemia, unspecified: Secondary | ICD-10-CM | POA: Diagnosis not present

## 2023-07-29 DIAGNOSIS — Z0000 Encounter for general adult medical examination without abnormal findings: Secondary | ICD-10-CM

## 2023-07-29 DIAGNOSIS — R7302 Impaired glucose tolerance (oral): Secondary | ICD-10-CM | POA: Diagnosis not present

## 2023-07-30 LAB — COMPLETE METABOLIC PANEL WITH GFR
AG Ratio: 1.9 (calc) (ref 1.0–2.5)
ALT: 17 U/L (ref 9–46)
AST: 15 U/L (ref 10–35)
Albumin: 4.4 g/dL (ref 3.6–5.1)
Alkaline phosphatase (APISO): 53 U/L (ref 35–144)
BUN: 24 mg/dL (ref 7–25)
CO2: 25 mmol/L (ref 20–32)
Calcium: 9.4 mg/dL (ref 8.6–10.3)
Chloride: 101 mmol/L (ref 98–110)
Creat: 1.21 mg/dL (ref 0.70–1.35)
Globulin: 2.3 g/dL (ref 1.9–3.7)
Glucose, Bld: 103 mg/dL — ABNORMAL HIGH (ref 65–99)
Potassium: 5.5 mmol/L — ABNORMAL HIGH (ref 3.5–5.3)
Sodium: 135 mmol/L (ref 135–146)
Total Bilirubin: 1.3 mg/dL — ABNORMAL HIGH (ref 0.2–1.2)
Total Protein: 6.7 g/dL (ref 6.1–8.1)
eGFR: 67 mL/min/{1.73_m2} (ref >=60)

## 2023-07-30 LAB — CBC WITH DIFFERENTIAL/PLATELET
Absolute Monocytes: 840 {cells}/uL (ref 200–950)
Basophils Absolute: 30 {cells}/uL (ref 0–200)
Basophils Relative: 0.4 %
Eosinophils Absolute: 98 {cells}/uL (ref 15–500)
Eosinophils Relative: 1.3 %
HCT: 42.7 % (ref 38.5–50.0)
Hemoglobin: 13.9 g/dL (ref 13.2–17.1)
Lymphs Abs: 2738 {cells}/uL (ref 850–3900)
MCH: 30.5 pg (ref 27.0–33.0)
MCHC: 32.6 g/dL (ref 32.0–36.0)
MCV: 93.6 fL (ref 80.0–100.0)
MPV: 9.6 fL (ref 7.5–12.5)
Monocytes Relative: 11.2 %
Neutro Abs: 3795 {cells}/uL (ref 1500–7800)
Neutrophils Relative %: 50.6 %
Platelets: 354 10*3/uL (ref 140–400)
RBC: 4.56 10*6/uL (ref 4.20–5.80)
RDW: 12.4 % (ref 11.0–15.0)
Total Lymphocyte: 36.5 %
WBC: 7.5 10*3/uL (ref 3.8–10.8)

## 2023-07-30 LAB — MICROALBUMIN / CREATININE URINE RATIO
Creatinine, Urine: 174 mg/dL (ref 20–320)
Microalb Creat Ratio: 7 mg/g{creat} (ref <30)
Microalb, Ur: 1.3 mg/dL

## 2023-07-30 LAB — PSA: PSA: 3.44 ng/mL (ref <=4.00)

## 2023-07-30 LAB — HEMOGLOBIN A1C
Hgb A1c MFr Bld: 6.3 %{Hb} — ABNORMAL HIGH (ref <5.7)
Mean Plasma Glucose: 134 mg/dL
eAG (mmol/L): 7.4 mmol/L

## 2023-07-30 LAB — LIPID PANEL
Cholesterol: 106 mg/dL (ref <200)
HDL: 45 mg/dL (ref >=40)
LDL Cholesterol (Calc): 45 mg/dL
Non-HDL Cholesterol (Calc): 61 mg/dL (ref <130)
Total CHOL/HDL Ratio: 2.4 (calc) (ref <5.0)
Triglycerides: 77 mg/dL (ref <150)

## 2023-08-02 ENCOUNTER — Encounter: Payer: Self-pay | Admitting: Internal Medicine

## 2023-08-02 ENCOUNTER — Ambulatory Visit (INDEPENDENT_AMBULATORY_CARE_PROVIDER_SITE_OTHER): Payer: 59 | Admitting: Internal Medicine

## 2023-08-02 VITALS — BP 110/60 | HR 65 | Ht 69.0 in | Wt 219.0 lb

## 2023-08-02 DIAGNOSIS — I252 Old myocardial infarction: Secondary | ICD-10-CM | POA: Diagnosis not present

## 2023-08-02 DIAGNOSIS — Z0000 Encounter for general adult medical examination without abnormal findings: Secondary | ICD-10-CM | POA: Diagnosis not present

## 2023-08-02 DIAGNOSIS — Z23 Encounter for immunization: Secondary | ICD-10-CM | POA: Diagnosis not present

## 2023-08-02 DIAGNOSIS — Z6832 Body mass index (BMI) 32.0-32.9, adult: Secondary | ICD-10-CM

## 2023-08-02 DIAGNOSIS — E7800 Pure hypercholesterolemia, unspecified: Secondary | ICD-10-CM | POA: Diagnosis not present

## 2023-08-02 DIAGNOSIS — Z951 Presence of aortocoronary bypass graft: Secondary | ICD-10-CM

## 2023-08-02 DIAGNOSIS — R7302 Impaired glucose tolerance (oral): Secondary | ICD-10-CM

## 2023-08-02 DIAGNOSIS — G4733 Obstructive sleep apnea (adult) (pediatric): Secondary | ICD-10-CM | POA: Diagnosis not present

## 2023-08-02 NOTE — Patient Instructions (Addendum)
Referral for colonoscopy. Consider referral for Endocrine (diabetic) evaluation at next visit. Work on diet and exercise.Continue same medications. As always, it was a pleasure to see you today.

## 2023-09-07 ENCOUNTER — Other Ambulatory Visit: Payer: Self-pay | Admitting: Cardiology

## 2023-09-07 DIAGNOSIS — E7800 Pure hypercholesterolemia, unspecified: Secondary | ICD-10-CM

## 2023-10-06 ENCOUNTER — Other Ambulatory Visit: Payer: Self-pay | Admitting: Cardiology

## 2023-10-06 DIAGNOSIS — E7800 Pure hypercholesterolemia, unspecified: Secondary | ICD-10-CM

## 2023-10-15 ENCOUNTER — Ambulatory Visit: Payer: 59 | Admitting: Internal Medicine

## 2023-10-15 VITALS — BP 100/60 | HR 88 | Temp 98.6°F | Ht 69.0 in | Wt 219.0 lb

## 2023-10-15 DIAGNOSIS — J22 Unspecified acute lower respiratory infection: Secondary | ICD-10-CM | POA: Diagnosis not present

## 2023-10-15 MED ORDER — AZITHROMYCIN 250 MG PO TABS: ORAL_TABLET | ORAL | 0 refills | Status: AC

## 2023-10-15 MED ORDER — BENZONATATE 100 MG PO CAPS: 100.0000 mg | ORAL_CAPSULE | Freq: Three times a day (TID) | ORAL | 1 refills | Status: AC | PRN

## 2023-10-15 NOTE — Progress Notes (Addendum)
Patient Care Team: Margaree Mackintosh, MD as PCP - General (Internal Medicine) Lewayne Bunting, MD as PCP - Cardiology (Cardiology)  Visit Date: 10/15/23  Subjective:    Patient ID: Zachary [name] , Male   DOB: Feb 03, 1961, 62 y.o.    MRN: [id number]   62 y.o. Male presents today for cough with green sputum, congestion since 10/05/23. Symptoms started with post-nasal drip, cough, sneezing, head congestion. He is sleeping well. Had sore throat that is now resolved.  Past Medical History:  Diagnosis Date   Hyperlipemia      Family History  Problem Relation Age of Onset   CAD Father    Stroke Father    CAD Brother    Aortic stenosis Brother     Social Hx: Retired, Married, enjoys golf     Review of Systems  Constitutional:  Negative for fever and malaise/fatigue.  HENT:  Positive for congestion.   Eyes:  Negative for blurred vision.  Respiratory:  Positive for cough and sputum production (Green). Negative for shortness of breath.   Cardiovascular:  Negative for chest pain, palpitations and leg swelling.  Gastrointestinal:  Negative for vomiting.  Musculoskeletal:  Negative for back pain.  Skin:  Negative for rash.  Neurological:  Negative for loss of consciousness and headaches.        Objective:   Vitals: BP 100/60   Pulse 88   Temp 98.6 F (37 C)   Ht 5\' 9"  (1.753 m)   Wt 219 lb (99.3 kg)   SpO2 96%   BMI 32.34 kg/m    Physical Exam Vitals and nursing note reviewed.  Constitutional:      General: He is not in acute distress.    Appearance: Normal appearance. He is not ill-appearing.  HENT:     Head: Normocephalic and atraumatic.     Right Ear: Hearing, tympanic membrane, ear canal and external ear normal.     Left Ear: Hearing, tympanic membrane, ear canal and external ear normal.     Mouth/Throat:     Comments: Pharynx injected without exudate. Pulmonary:     Effort: Pulmonary effort is normal. No respiratory distress.     Breath sounds: Normal  breath sounds. No wheezing or rales.  Skin:    General: Skin is warm and dry.  Neurological:     Mental Status: He is alert and oriented to person, place, and time. Mental status is at baseline.  Psychiatric:        Mood and Affect: Mood normal.        Behavior: Behavior normal.        Thought Content: Thought content normal.        Judgment: Judgment normal.       Results:   Studies obtained and personally reviewed by me:   Labs:       Component Value Date/Time   NA 135 07/29/2023 0910   NA 139 11/15/2019 0841   K 5.5 (H) 07/29/2023 0910   CL 101 07/29/2023 0910   CO2 25 07/29/2023 0910   GLUCOSE 103 (H) 07/29/2023 0910   BUN 24 07/29/2023 0910   BUN 16 11/15/2019 0841   CREATININE 1.21 07/29/2023 0910   CALCIUM 9.4 07/29/2023 0910   PROT 6.7 07/29/2023 0910   PROT 6.5 12/16/2022 0829   ALBUMIN 4.4 12/16/2022 0829   AST 15 07/29/2023 0910   ALT 17 07/29/2023 0910   ALKPHOS 66 12/16/2022 0829   BILITOT 1.3 (H) 07/29/2023 0910  BILITOT 0.6 12/16/2022 0829   GFRNONAA 80 06/18/2020 0931   GFRAA 93 06/18/2020 0931     Lab Results  Component Value Date   WBC 7.5 07/29/2023   HGB 13.9 07/29/2023   HCT 42.7 07/29/2023   MCV 93.6 07/29/2023   PLT 354 07/29/2023    Lab Results  Component Value Date   CHOL 106 07/29/2023   HDL 45 07/29/2023   LDLCALC 45 07/29/2023   TRIG 77 07/29/2023   CHOLHDL 2.4 07/29/2023    Lab Results  Component Value Date   HGBA1C 6.3 (H) 07/29/2023     Lab Results  Component Value Date   TSH 1.80 07/07/2021     Lab Results  Component Value Date   PSA 3.44 07/29/2023   PSA 3.08 07/13/2022   PSA 3.06 07/07/2021      Assessment & Plan:   Acute lower respiratory infection: prescribed Z-Pak two tabs day 1 followed by one tab days 2-5, Tessalon 100 mg three times daily as needed for cough. Get plenty of rest and stay well-hydrated. Contact us if symptoms worsen or fail to improve.    I,Alexander Ruley,acting as a Neurosurgeon  for Margaree Mackintosh, MD.,have documented all relevant documentation on the behalf of Margaree Mackintosh, MD,as directed by  Margaree Mackintosh, MD   I, Margaree Mackintosh, MD, have reviewed all documentation for this visit. The documentation on 10/25/23 for the exam, diagnosis, procedures, and orders are all accurate and complete.

## 2023-10-18 ENCOUNTER — Other Ambulatory Visit: Payer: Self-pay | Admitting: Cardiology

## 2023-10-18 DIAGNOSIS — I10 Essential (primary) hypertension: Secondary | ICD-10-CM

## 2023-10-25 ENCOUNTER — Other Ambulatory Visit: Payer: Self-pay

## 2023-10-25 ENCOUNTER — Other Ambulatory Visit: Payer: Self-pay | Admitting: Cardiology

## 2023-10-25 ENCOUNTER — Encounter: Payer: Self-pay | Admitting: Internal Medicine

## 2023-10-25 DIAGNOSIS — I10 Essential (primary) hypertension: Secondary | ICD-10-CM

## 2023-10-25 MED ORDER — METOPROLOL TARTRATE 25 MG PO TABS: 12.5000 mg | ORAL_TABLET | Freq: Two times a day (BID) | ORAL | 0 refills | Status: DC

## 2023-10-25 NOTE — Patient Instructions (Signed)
You have been diagnosed with an acute lower respiratory infection.  Please take Zithromax Z-PAK 2 tabs day 1 followed by 1 tab days 2 through 5.  Rest and stay well-hydrated.  Contact us if symptoms worsen or fail to improve within a few days.  May take Tessalon Perles if needed for cough.

## 2023-10-26 ENCOUNTER — Other Ambulatory Visit: Payer: Self-pay | Admitting: Cardiology

## 2023-10-26 MED ORDER — LISINOPRIL 5 MG PO TABS: 5.0000 mg | ORAL_TABLET | Freq: Every day | ORAL | 0 refills | Status: DC

## 2023-10-28 NOTE — Telephone Encounter (Signed)
 Refill Request.

## 2023-11-08 ENCOUNTER — Other Ambulatory Visit: Payer: Self-pay

## 2023-11-08 ENCOUNTER — Telehealth: Payer: Self-pay | Admitting: Cardiology

## 2023-11-08 DIAGNOSIS — E7800 Pure hypercholesterolemia, unspecified: Secondary | ICD-10-CM

## 2023-11-08 DIAGNOSIS — I10 Essential (primary) hypertension: Secondary | ICD-10-CM

## 2023-11-08 MED ORDER — EZETIMIBE 10 MG PO TABS: 10.0000 mg | ORAL_TABLET | Freq: Every day | ORAL | 0 refills | Status: DC

## 2023-11-08 MED ORDER — METOPROLOL TARTRATE 25 MG PO TABS: 12.5000 mg | ORAL_TABLET | Freq: Two times a day (BID) | ORAL | 0 refills | Status: DC

## 2023-11-08 MED ORDER — METFORMIN HCL 500 MG PO TABS: 500.0000 mg | ORAL_TABLET | Freq: Two times a day (BID) | ORAL | 3 refills | Status: DC

## 2023-11-08 MED ORDER — LISINOPRIL 5 MG PO TABS: 5.0000 mg | ORAL_TABLET | Freq: Every day | ORAL | 0 refills | Status: DC

## 2023-11-08 MED ORDER — ATORVASTATIN CALCIUM 80 MG PO TABS: 80.0000 mg | ORAL_TABLET | Freq: Every day | ORAL | 0 refills | Status: DC

## 2023-11-08 NOTE — Telephone Encounter (Signed)
*  STAT* If patient is at the pharmacy, call can be transferred to refill team.   1. Which medications need to be refilled? (please list name of each medication and dose if known) atorvastatin  (LIPITOR ) 80 MG tablet ; ezetimibe  (ZETIA ) 10 MG tablet ; lisinopril  (ZESTRIL ) 5 MG tablet ; metoprolol  tartrate (LOPRESSOR ) 25 MG tablet    2. Would you like to learn more about the convenience, safety, & potential cost savings by using the [facility]?      3. Are you open to using the [facility] (Type [facility]. ).   4. Which pharmacy/location (including street and city if local pharmacy) is medication to be sent to? [facility] - [place], [place] - [address] [facility] AT Portal to Registered [facility] Sites    5. Do they need a 30 day or 90 day supply? 90

## 2023-11-28 ENCOUNTER — Other Ambulatory Visit: Payer: Self-pay | Admitting: Cardiology

## 2023-12-01 NOTE — Progress Notes (Deleted)
 HPI: FU CAD.  Patient suffered a non-ST elevation myocardial infarction June 2019.  Cardiac catheterization at that time revealed severe three-vessel coronary artery disease and normal LV function.  Preoperative carotid Dopplers showed 1 to 39% bilateral stenosis.  Patient had coronary artery bypass graft on April 25, 2018 with a LIMA to the LAD, saphenous vein graft to the PDA and saphenous vein graft to the circumflex.  Had postoperative atrial fibrillation treated with amiodarone. Also with OSA followed by Dr Tresa Endo.  Echocardiogram September 2021 showed normal LV function, grade 1 diastolic dysfunction.  Since last seen,   Current Outpatient Medications  Medication Sig Dispense Refill   ACCU-CHEK GUIDE test strip CHECK GLUCOSE DAILY 100 strip 4   acetaminophen (TYLENOL) 325 MG tablet Take 325 mg by mouth every 6 (six) hours as needed.     ALPRAZolam (XANAX) 0.5 MG tablet Take 1 to 2 tabs three times a day 30 tablet 1   aspirin EC 81 MG tablet Take 81 mg by mouth daily.     atorvastatin (LIPITOR) 80 MG tablet Take 1 tablet (80 mg total) by mouth daily. 90 tablet 0   benzonatate (TESSALON) 100 MG capsule Take 1 capsule (100 mg total) by mouth 3 (three) times daily as needed for cough. 30 capsule 1   ezetimibe (ZETIA) 10 MG tablet Take 1 tablet (10 mg total) by mouth daily. 90 tablet 0   lisinopril (ZESTRIL) 5 MG tablet Take 1 tablet (5 mg total) by mouth daily. 90 tablet 0   metFORMIN (GLUCOPHAGE) 500 MG tablet Take 1 tablet (500 mg total) by mouth 2 (two) times daily with a meal. 180 tablet 3   metoprolol tartrate (LOPRESSOR) 25 MG tablet Take 0.5 tablets (12.5 mg total) by mouth 2 (two) times daily. 90 tablet 0   valACYclovir (VALTREX) 1000 MG tablet Take 1 tablet (1,000 mg total) by mouth 2 (two) times daily. 20 tablet 11   No current facility-administered medications for this visit.     Past Medical History:  Diagnosis Date   Hyperlipemia     Past Surgical History:  Procedure  Laterality Date   CORONARY ARTERY BYPASS GRAFT N/A 04/23/2018   Procedure: CORONARY ARTERY BYPASS GRAFTING (CABG) x3 using the right greater saphenous vein harvested endoscopically and the left internal mammary artery. LIMA to LAD, SVG to Distal Circ, SVG to PD;  Surgeon: Delight Ovens, MD;  Location: [facility] OR;  Service: Open Heart Surgery;  Laterality: N/A;   LEFT HEART CATH AND CORONARY ANGIOGRAPHY N/A 04/21/2018   Procedure: LEFT HEART CATH AND CORONARY ANGIOGRAPHY;  Surgeon: Runell Gess, MD;  Location: [facility] INVASIVE CV LAB;  Service: Cardiovascular;  Laterality: N/A;   TEE WITHOUT CARDIOVERSION N/A 04/23/2018   Procedure: TRANSESOPHAGEAL ECHOCARDIOGRAM (TEE);  Surgeon: Delight Ovens, MD;  Location: [facility] OR;  Service: Open Heart Surgery;  Laterality: N/A;    Social History   Socioeconomic History   Marital status: Married    Spouse name: Not on file   Number of children: Not on file   Years of education: Not on file   Highest education level: Bachelor's degree (e.g., BA, AB, BS)  Occupational History   Not on file  Tobacco Use   Smoking status: Former    Current packs/day: 0.00    Types: Cigarettes    Start date: 10/26/1982    Quit date: 04/20/2018    Years since quitting: 5.6   Smokeless tobacco: Never   Tobacco comments:  smoked about 20 cigarrets every 2 days.   Substance and Sexual Activity   Alcohol use: Yes    Comment: socially    Drug use: Never   Sexual activity: Yes  Other Topics Concern   Not on file  Social History Narrative   Not on file   Social Drivers of Health   Financial Resource Strain: Low Risk  (01/15/2023)   Overall Financial Resource Strain (CARDIA)    Difficulty of Paying Living Expenses: Not hard at all  Food Insecurity: No Food Insecurity (01/15/2023)   Hunger Vital Sign    Worried About Running Out of Food in the Last Year: Never true    Ran Out of Food in the Last Year: Never true  Transportation Needs: No Transportation Needs  (01/15/2023)   PRAPARE - Administrator, Civil Service (Medical): No    Lack of Transportation (Non-Medical): No  Physical Activity: Sufficiently Active (01/15/2023)   Exercise Vital Sign    Days of Exercise per Week: 5 days    Minutes of Exercise per Session: 70 min  Stress: No Stress Concern Present (01/15/2023)   [employer] of Occupational Health - Occupational Stress Questionnaire    Feeling of Stress : Not at all  Social Connections: Socially Integrated (01/15/2023)   Social Connection and Isolation Panel [NHANES]    Frequency of Communication with Friends and Family: More than three times a week    Frequency of Social Gatherings with Friends and Family: Twice a week    Attends Religious Services: More than 4 times per year    Active Member of [employer] or Organizations: Yes    Attends Banker Meetings: 1 to 4 times per year    Marital Status: Married  Catering manager Violence: Not on file    Family History  Problem Relation Age of Onset   CAD Father    Stroke Father    CAD Brother    Aortic stenosis Brother     ROS: no fevers or chills, productive cough, hemoptysis, dysphasia, odynophagia, melena, hematochezia, dysuria, hematuria, rash, seizure activity, orthopnea, PND, pedal edema, claudication. Remaining systems are negative.  Physical Exam: Well-developed well-nourished in no acute distress.  Skin is warm and dry.  HEENT is normal.  Neck is supple.  Chest is clear to auscultation with normal expansion.  Cardiovascular exam is regular rate and rhythm.  Abdominal exam nontender or distended. No masses palpated. Extremities show no edema. neuro grossly intact  ECG- personally reviewed  A/P  1 coronary artery disease status post coronary bypass and graft-patient doing well with no chest pain.  Continue aspirin and statin.  2 hyperlipidemia-continue statin.  3 hypertension-blood pressure is controlled.  Continue present medical  regimen.  4 obstructive sleep apnea-previously followed by Dr. Tresa Endo.  Olga Millers, MD

## 2023-12-15 ENCOUNTER — Ambulatory Visit: Payer: 59 | Admitting: Cardiology

## 2023-12-27 ENCOUNTER — Other Ambulatory Visit: Payer: Self-pay | Admitting: Internal Medicine

## 2023-12-27 MED ORDER — METFORMIN HCL 500 MG PO TABS: 500.0000 mg | ORAL_TABLET | Freq: Two times a day (BID) | ORAL | 3 refills | Status: AC

## 2023-12-27 NOTE — Telephone Encounter (Signed)
 Copied from CRM [id number]. Topic: Clinical - Medication Refill >> Dec 27, 2023  9:31 AM Fuller Canada P wrote: Most Recent Primary Care Visit:  Provider: Margaree Mackintosh  Department: [facility]  Visit Type: OFFICE VISIT  Date: 10/15/2023  Medication: metFORMIN (GLUCOPHAGE) 500 MG tablet    Has the patient contacted their pharmacy? Yes (Agent: If no, request that the patient contact the pharmacy for the refill. If patient does not wish to contact the pharmacy document the reason why and proceed with request.) (Agent: If yes, when and what did the pharmacy advise?)  Is this the correct pharmacy for this prescription? No If no, delete pharmacy and type the correct one.  This is the patient's preferred pharmacy:  [facility]  [address] [address] [place], [place] [address] Phone: [phone]    Has the prescription been filled recently? No  Is the patient out of the medication? Yes  Has the patient been seen for an appointment in the last year OR does the patient have an upcoming appointment? Yes  Can we respond through MyChart? Yes  Agent: Please be advised that Rx refills may take up to 3 business days. We ask that you follow-up with your pharmacy.

## 2023-12-28 ENCOUNTER — Other Ambulatory Visit: Payer: Self-pay | Admitting: Cardiology

## 2023-12-28 ENCOUNTER — Telehealth: Payer: Self-pay | Admitting: Cardiology

## 2023-12-28 DIAGNOSIS — E7800 Pure hypercholesterolemia, unspecified: Secondary | ICD-10-CM

## 2023-12-28 DIAGNOSIS — I10 Essential (primary) hypertension: Secondary | ICD-10-CM

## 2023-12-28 MED ORDER — METOPROLOL TARTRATE 25 MG PO TABS: 12.5000 mg | ORAL_TABLET | Freq: Two times a day (BID) | ORAL | 3 refills | Status: DC

## 2023-12-28 MED ORDER — ATORVASTATIN CALCIUM 80 MG PO TABS: 80.0000 mg | ORAL_TABLET | Freq: Every day | ORAL | 3 refills | Status: DC

## 2023-12-28 MED ORDER — EZETIMIBE 10 MG PO TABS: 10.0000 mg | ORAL_TABLET | Freq: Every day | ORAL | 3 refills | Status: DC

## 2023-12-28 MED ORDER — LISINOPRIL 5 MG PO TABS: 5.0000 mg | ORAL_TABLET | Freq: Every day | ORAL | 3 refills | Status: DC

## 2023-12-28 NOTE — Telephone Encounter (Signed)
*  STAT* If patient is at the pharmacy, call can be transferred to refill team.   1. Which medications need to be refilled? (please list name of each medication and dose if known) atorvastatin (LIPITOR) 80 MG tablet  ezetimibe (ZETIA) 10 MG tablet  lisinopril (ZESTRIL) 5 MG tablet  metoprolol tartrate (LOPRESSOR) 25 MG tablet   2. Which pharmacy/location (including street and city if local pharmacy) is medication to be sent to? [facility]  [phone]   3. Do they need a 30 day or 90 day supply? 90

## 2023-12-30 ENCOUNTER — Other Ambulatory Visit: Payer: Self-pay | Admitting: Cardiology

## 2023-12-30 DIAGNOSIS — E7800 Pure hypercholesterolemia, unspecified: Secondary | ICD-10-CM

## 2023-12-30 DIAGNOSIS — I10 Essential (primary) hypertension: Secondary | ICD-10-CM

## 2024-02-01 ENCOUNTER — Other Ambulatory Visit: Payer: Self-pay

## 2024-02-01 DIAGNOSIS — R7302 Impaired glucose tolerance (oral): Secondary | ICD-10-CM

## 2024-02-01 DIAGNOSIS — E7800 Pure hypercholesterolemia, unspecified: Secondary | ICD-10-CM

## 2024-02-01 NOTE — Progress Notes (Signed)
 Zachary Care Team: Margaree Mackintosh, MD as PCP - General (Internal Medicine) Lewayne Bunting, MD as PCP - Cardiology (Cardiology)  Visit Date: 02/03/24  Subjective:   Chief Complaint  Zachary presents with   Follow-up  Zachary [name],Male DOB:September 12, 1961,63 y.o. VWU:[id number]   63 y.o. Male presents today for 6 months follow-up for Hyperlipidemia; Impaired Glucose Tolerance; Hypertension. Zachary has a past medical history of NSTEMI; Acute Coronary Syndrome; S/p CABG x3; Grade 1 Diastolic Dysfunction. Last seen for his annual visit on 08/02/2023 and again on 12/20 for an acute visit he hasn't been seen anywhere else.   Says that he had been bitten by what he believes may have been a spider, but the wound is healing without evidence of infection thus far.    History of Hypertension treated with Lisinopril 5 mg daily and Metoprolol tartrate 12.5 mg twice daily. Blood Pressure: normotensive today at 132/82.  History of Hyperlipidemia treated with Atorvastatin 80 mg daily and Zetia 10 mg daily. 02/01/2024 Lipid Panel WNL  History of Impaired Glucose Tolerance treated with Metformin 500 mg twice daily.  02/01/2024 HgbA1c 6.4, elevated from 6.3 in 07/2023, no change from 12/2022; Blood Glucose 108, elevated from 103 in 07/2023. He says that he does tend to enjoy his iced tea and sweets, but is determined to work on his diet and exercise.   Reviewed 02/01/2024 C-MET, compared to 07/2023 with BUN 29, elevated from 24; BUN/Creatinine 25, elevated out of normal limits; Sodium 134, slightly decreased from 135; Potassium 6.4, elevated from 5.5, likely due to hemolysis. He says that he had been dehydrated during his lab drawing, which likely contributes to his elevated kidney functions.  Vaccine Counseling: declines Covid-19, discussed PNA and he is agreeable to having this done.   Health Maintenance: Colonoscopy discussed. He says that he has had a prior colonoscopy 10+ years ago, and can't  remember many details about this, but is agreeable to repeating this year. Past Medical History:  Diagnosis Date   Hyperlipemia   No Known Allergies  Family History  Problem Relation Age of Onset   CAD Father    Stroke Father    CAD Brother    Aortic stenosis Brother    Social History   Social History Narrative   Not on file   Review of Systems  Constitutional:  Negative for fever and malaise/fatigue.  HENT:  Negative for congestion.   Eyes:  Negative for blurred vision.  Respiratory:  Negative for cough and shortness of breath.   Cardiovascular:  Negative for chest pain, palpitations and leg swelling.  Gastrointestinal:  Negative for vomiting.  Musculoskeletal:  Negative for back pain.  Skin:  Negative for rash.  Neurological:  Negative for loss of consciousness and headaches.     Objective:  Vitals: BP 132/82   Pulse 66   Temp (!) 97.4 F (36.3 C) (Temporal)   Ht 5' 9.5" (1.765 m)   Wt 223 lb (101.2 kg)   SpO2 97%   BMI 32.46 kg/m   Physical Exam Constitutional:      General: He is not in acute distress.    Appearance: Normal appearance. He is not ill-appearing.  HENT:     Head: Normocephalic and atraumatic.  Cardiovascular:     Rate and Rhythm: Normal rate and regular rhythm.     Pulses: Normal pulses.     Heart sounds: Normal heart sounds. No murmur heard.    No friction rub. No gallop.  Pulmonary:  Effort: Pulmonary effort is normal. No respiratory distress.     Breath sounds: Normal breath sounds. No wheezing or rales.  Skin:    General: Skin is warm and dry.  Neurological:     Mental Status: He is alert and oriented to person, place, and time. Mental status is at baseline.  Psychiatric:        Mood and Affect: Mood normal.        Behavior: Behavior normal.        Thought Content: Thought content normal.        Judgment: Judgment normal.     Results:  Studies Obtained And Personally Reviewed By Me: Labs:     Component Value Date/Time   NA  134 (L) 02/01/2024 0911   NA 139 11/15/2019 0841   K 6.4 (HH) 02/01/2024 0911   CL 103 02/01/2024 0911   CO2 26 02/01/2024 0911   GLUCOSE 108 (H) 02/01/2024 0911   BUN 29 (H) 02/01/2024 0911   BUN 16 11/15/2019 0841   CREATININE 1.16 02/01/2024 0911   CALCIUM 9.6 02/01/2024 0911   PROT 6.7 02/01/2024 0911   PROT 6.5 12/16/2022 0829   ALBUMIN 4.4 12/16/2022 0829   AST 16 02/01/2024 0911   ALT 14 02/01/2024 0911   ALKPHOS 66 12/16/2022 0829   BILITOT 0.9 02/01/2024 0911   BILITOT 0.6 12/16/2022 0829   GFRNONAA 80 06/18/2020 0931   GFRAA 93 06/18/2020 0931    Lab Results  Component Value Date   WBC 7.5 07/29/2023   HGB 13.9 07/29/2023   HCT 42.7 07/29/2023   MCV 93.6 07/29/2023   PLT 354 07/29/2023   Lab Results  Component Value Date   CHOL 106 02/01/2024   HDL 41 02/01/2024   LDLCALC 46 02/01/2024   TRIG 104 02/01/2024   CHOLHDL 2.6 02/01/2024   Lab Results  Component Value Date   HGBA1C 6.4 (H) 02/01/2024    Lab Results  Component Value Date   TSH 1.80 07/07/2021    Lab Results  Component Value Date   PSA 3.44 07/29/2023   PSA 3.08 07/13/2022   PSA 3.06 07/07/2021     Assessment & Plan:   Insect Bite, unspecified: he had been bitten by what he believes may have been a spider, but the wound is healing without evidence of infection thus far. Written prescription provided for Triamcinolone cream 0.1% to apply to bite three times daily.   Hypertension treated with Lisinopril 5 mg daily and Metoprolol tartrate 12.5 mg twice daily. Blood Pressure: normotensive today at 132/82.  Hyperlipidemia treated with Atorvastatin 80 mg daily and Zetia 10 mg daily. 02/01/2024 Lipid Panel WNL  Impaired Glucose Tolerance treated with Metformin 500 mg twice daily.  02/01/2024 HgbA1c 6.4, elevated from 6.3 in 07/2023, no change from 12/2022; Blood Glucose 108, elevated from 103 in 07/2023. He says that he does tend to enjoy his iced tea and sweets, but is determined to work on his  diet and exercise.   Reviewed 02/01/2024 C-MET, compared to 07/2023 with BUN 29, elevated from 24; BUN/Creatinine 25, elevated out of normal limits; Sodium 134, slightly decreased from 135; Potassium 6.4, elevated from 5.5, likely due to hemolysis. He says that he had been dehydrated during his lab drawing, which likely contributes to his elevated kidney functions. Recollected today.  Vaccine Counseling: declines Covid-19, discussed PNA and he is agreeable to having this done.   Health Maintenance: Colonoscopy discussed. He says that he has had a prior colonoscopy 10+ years  ago, and can't remember many details about this, but is agreeable to repeating this year. Referral placed.   Return in about 3 months (around 05/04/2024) for A1c Recheck. Annual visit scheduled for 08/10/2024 with fasting labs on 08/08/2024.  I,Emily Lagle,acting as a Neurosurgeon for Margaree Mackintosh, MD.,have documented all relevant documentation on the behalf of Margaree Mackintosh, MD,as directed by  Margaree Mackintosh, MD while in the presence of Margaree Mackintosh, MD.   ***

## 2024-02-01 NOTE — Addendum Note (Signed)
 Addended by: Thelma Barge D on: 02/01/2024 09:09 AM   Modules accepted: Orders

## 2024-02-02 LAB — COMPREHENSIVE METABOLIC PANEL WITH GFR
AG Ratio: 2 (calc) (ref 1.0–2.5)
ALT: 14 U/L (ref 9–46)
AST: 16 U/L (ref 10–35)
Albumin: 4.5 g/dL (ref 3.6–5.1)
Alkaline phosphatase (APISO): 49 U/L (ref 35–144)
BUN/Creatinine Ratio: 25 (calc) — ABNORMAL HIGH (ref 6–22)
BUN: 29 mg/dL — ABNORMAL HIGH (ref 7–25)
CO2: 26 mmol/L (ref 20–32)
Calcium: 9.6 mg/dL (ref 8.6–10.3)
Chloride: 103 mmol/L (ref 98–110)
Creat: 1.16 mg/dL (ref 0.70–1.35)
Globulin: 2.2 g/dL (ref 1.9–3.7)
Glucose, Bld: 108 mg/dL — ABNORMAL HIGH (ref 65–99)
Potassium: 6.4 mmol/L (ref 3.5–5.3)
Sodium: 134 mmol/L — ABNORMAL LOW (ref 135–146)
Total Bilirubin: 0.9 mg/dL (ref 0.2–1.2)
Total Protein: 6.7 g/dL (ref 6.1–8.1)
eGFR: 70 mL/min/{1.73_m2} (ref >=60)

## 2024-02-02 LAB — HEMOGLOBIN A1C
Hgb A1c MFr Bld: 6.4 %{Hb} — ABNORMAL HIGH (ref <5.7)
Mean Plasma Glucose: 137 mg/dL
eAG (mmol/L): 7.6 mmol/L

## 2024-02-02 LAB — LIPID PANEL
Cholesterol: 106 mg/dL (ref <200)
HDL: 41 mg/dL (ref >=40)
LDL Cholesterol (Calc): 46 mg/dL
Non-HDL Cholesterol (Calc): 65 mg/dL (ref <130)
Total CHOL/HDL Ratio: 2.6 (calc) (ref <5.0)
Triglycerides: 104 mg/dL (ref <150)

## 2024-02-03 ENCOUNTER — Ambulatory Visit: Payer: Self-pay | Admitting: Internal Medicine

## 2024-02-03 VITALS — BP 132/82 | HR 66 | Temp 97.4°F | Ht 69.5 in | Wt 223.0 lb

## 2024-02-03 DIAGNOSIS — E875 Hyperkalemia: Secondary | ICD-10-CM

## 2024-02-03 DIAGNOSIS — I10 Essential (primary) hypertension: Secondary | ICD-10-CM

## 2024-02-03 DIAGNOSIS — W57XXXA Bitten or stung by nonvenomous insect and other nonvenomous arthropods, initial encounter: Secondary | ICD-10-CM

## 2024-02-03 DIAGNOSIS — Z1211 Encounter for screening for malignant neoplasm of colon: Secondary | ICD-10-CM

## 2024-02-03 DIAGNOSIS — Z951 Presence of aortocoronary bypass graft: Secondary | ICD-10-CM

## 2024-02-03 DIAGNOSIS — R7302 Impaired glucose tolerance (oral): Secondary | ICD-10-CM | POA: Diagnosis not present

## 2024-02-03 DIAGNOSIS — E785 Hyperlipidemia, unspecified: Secondary | ICD-10-CM

## 2024-02-03 DIAGNOSIS — Z8639 Personal history of other endocrine, nutritional and metabolic disease: Secondary | ICD-10-CM

## 2024-02-03 DIAGNOSIS — E7800 Pure hypercholesterolemia, unspecified: Secondary | ICD-10-CM

## 2024-02-03 DIAGNOSIS — Z6832 Body mass index (BMI) 32.0-32.9, adult: Secondary | ICD-10-CM

## 2024-02-03 DIAGNOSIS — I252 Old myocardial infarction: Secondary | ICD-10-CM

## 2024-02-03 LAB — COMPLETE METABOLIC PANEL WITHOUT GFR
AG Ratio: 2 (calc) (ref 1.0–2.5)
ALT: 14 U/L (ref 9–46)
AST: 16 U/L (ref 10–35)
Albumin: 4.3 g/dL (ref 3.6–5.1)
Alkaline phosphatase (APISO): 54 U/L (ref 35–144)
BUN: 19 mg/dL (ref 7–25)
CO2: 25 mmol/L (ref 20–32)
Calcium: 9.3 mg/dL (ref 8.6–10.3)
Chloride: 98 mmol/L (ref 98–110)
Creat: 1.06 mg/dL (ref 0.70–1.35)
Globulin: 2.2 g/dL (ref 1.9–3.7)
Glucose, Bld: 99 mg/dL (ref 65–99)
Potassium: 5.2 mmol/L (ref 3.5–5.3)
Sodium: 131 mmol/L — ABNORMAL LOW (ref 135–146)
Total Bilirubin: 1.1 mg/dL (ref 0.2–1.2)
Total Protein: 6.5 g/dL (ref 6.1–8.1)

## 2024-02-04 ENCOUNTER — Encounter: Payer: Self-pay | Admitting: Internal Medicine

## 2024-02-04 NOTE — Patient Instructions (Addendum)
 Have redrawn serum potassium as I believe elevated potassium is an error. His repeat level is normal. Hgb AIC is 6.4 %. He will return in 3 months for follow up on diabetes mellitus. He will work on diet and exercise regimen. No change in meds.

## 2024-02-07 ENCOUNTER — Telehealth: Payer: Self-pay | Admitting: *Deleted

## 2024-02-07 NOTE — Telephone Encounter (Signed)
 Already repeated

## 2024-02-07 NOTE — Telephone Encounter (Signed)
 Copied from CRM [id number]. Topic: Clinical - Lab/Test Results >> Feb 02, 2024  9:40 AM Georgeann Kindred wrote: Reason for CRM: [facility], Critical Labs:  Final: 02/01/24 at 9:11 am  Potassium - 6.4

## 2024-02-21 ENCOUNTER — Telehealth: Payer: Self-pay | Admitting: Cardiology

## 2024-02-21 DIAGNOSIS — I10 Essential (primary) hypertension: Secondary | ICD-10-CM

## 2024-02-21 DIAGNOSIS — E7800 Pure hypercholesterolemia, unspecified: Secondary | ICD-10-CM

## 2024-02-21 MED ORDER — LISINOPRIL 5 MG PO TABS: 5.0000 mg | ORAL_TABLET | Freq: Every day | ORAL | 3 refills | Status: AC

## 2024-02-21 MED ORDER — EZETIMIBE 10 MG PO TABS: 10.0000 mg | ORAL_TABLET | Freq: Every day | ORAL | 3 refills | Status: AC

## 2024-02-21 MED ORDER — METOPROLOL TARTRATE 25 MG PO TABS: 12.5000 mg | ORAL_TABLET | Freq: Two times a day (BID) | ORAL | 3 refills | Status: AC

## 2024-02-21 MED ORDER — ATORVASTATIN CALCIUM 80 MG PO TABS: 80.0000 mg | ORAL_TABLET | Freq: Every day | ORAL | 3 refills | Status: AC

## 2024-02-21 NOTE — Telephone Encounter (Signed)
 Pt c/o medication issue:  1. Name of Medication: lisinopril  (ZESTRIL ) 5 MG tablet  metoprolol  tartrate (LOPRESSOR ) 25 MG tablet  ezetimibe  (ZETIA ) 10 MG tablet  atorvastatin  (LIPITOR ) 80 MG tablet   2. How are you currently taking this medication (dosage and times per day)? Yes  3. Are you having a reaction (difficulty breathing--STAT)? No  4. What is your medication issue? [facility] stating they need our office to send information in order for pt to use services. Please advise

## 2024-02-21 NOTE — Telephone Encounter (Signed)
Returned call to patient left message on personal voice mail to call back. 

## 2024-02-21 NOTE — Telephone Encounter (Signed)
 Patient is returning call.

## 2024-02-21 NOTE — Progress Notes (Signed)
 HPI: FU CAD.  Patient suffered a non-ST elevation myocardial infarction June 2019.  Cardiac catheterization at that time revealed severe three-vessel coronary artery disease and normal LV function. Preoperative carotid Dopplers showed 1 to 39% bilateral stenosis. Patient had coronary artery bypass graft on April 25, 2018 with a LIMA to the LAD, saphenous vein graft to the PDA and saphenous vein graft to the circumflex.  Had postoperative atrial fibrillation treated with amiodarone . Also with OSA followed by Dr Loetta Ringer. Echocardiogram September 2021 showed normal LV function, grade 1 diastolic dysfunction.  Since last seen, the patient has dyspnea with more extreme activities but not with routine activities. It is relieved with rest. It is not associated with chest pain. There is no orthopnea, PND or pedal edema. There is no syncope or palpitations. There is no exertional chest pain. Risk  Current Outpatient Medications  Medication Sig Dispense Refill   ACCU-CHEK GUIDE test strip CHECK GLUCOSE DAILY 100 strip 4   acetaminophen  (TYLENOL ) 325 MG tablet Take 325 mg by mouth every 6 (six) hours as needed.     aspirin  EC 81 MG tablet Take 81 mg by mouth daily.     atorvastatin  (LIPITOR ) 80 MG tablet Take 1 tablet (80 mg total) by mouth daily. 90 tablet 3   benzonatate  (TESSALON ) 100 MG capsule Take 1 capsule (100 mg total) by mouth 3 (three) times daily as needed for cough. 30 capsule 1   ezetimibe  (ZETIA ) 10 MG tablet Take 1 tablet (10 mg total) by mouth daily. 90 tablet 3   lisinopril  (ZESTRIL ) 5 MG tablet Take 1 tablet (5 mg total) by mouth daily. 90 tablet 3   metFORMIN  (GLUCOPHAGE ) 500 MG tablet Take 1 tablet (500 mg total) by mouth 2 (two) times daily with a meal. 180 tablet 3   metoprolol  tartrate (LOPRESSOR ) 25 MG tablet Take 0.5 tablets (12.5 mg total) by mouth 2 (two) times daily. 180 tablet 3   valACYclovir  (VALTREX ) 1000 MG tablet Take 1 tablet (1,000 mg total) by mouth 2 (two) times daily.  20 tablet 11   ALPRAZolam  (XANAX ) 0.5 MG tablet Take 1 to 2 tabs three times a day (Patient not taking: Reported on 03/01/2024) 30 tablet 1   No current facility-administered medications for this visit.     Past Medical History:  Diagnosis Date   Hyperlipemia     Past Surgical History:  Procedure Laterality Date   CORONARY ARTERY BYPASS GRAFT N/A 04/23/2018   Procedure: CORONARY ARTERY BYPASS GRAFTING (CABG) x3 using the right greater saphenous vein harvested endoscopically and the left internal mammary artery. LIMA to LAD, SVG to Distal Circ, SVG to PD;  Surgeon: Norita Beauvais, MD;  Location: [facility] OR;  Service: Open Heart Surgery;  Laterality: N/A;   LEFT HEART CATH AND CORONARY ANGIOGRAPHY N/A 04/21/2018   Procedure: LEFT HEART CATH AND CORONARY ANGIOGRAPHY;  Surgeon: Avanell Leigh, MD;  Location: [facility];  Service: Cardiovascular;  Laterality: N/A;   TEE WITHOUT CARDIOVERSION N/A 04/23/2018   Procedure: TRANSESOPHAGEAL ECHOCARDIOGRAM (TEE);  Surgeon: Norita Beauvais, MD;  Location: [facility] OR;  Service: Open Heart Surgery;  Laterality: N/A;    Social History   Socioeconomic History   Marital status: Married    Spouse name: Not on file   Number of children: Not on file   Years of education: Not on file   Highest education level: Bachelor's degree (e.g., BA, AB, BS)  Occupational History   Not on file  Tobacco  Use   Smoking status: Former    Current packs/day: 0.00    Types: Cigarettes    Start date: 10/26/1982    Quit date: 04/20/2018    Years since quitting: 5.8   Smokeless tobacco: Never   Tobacco comments:    smoked about 20 cigarrets every 2 days.   Substance and Sexual Activity   Alcohol use: Yes    Comment: socially    Drug use: Never   Sexual activity: Yes  Other Topics Concern   Not on file  Social History Narrative   Not on file   Social Drivers of Health   Financial Resource Strain: Low Risk  (02/02/2024)   Overall Financial Resource Strain  (CARDIA)    Difficulty of Paying Living Expenses: Not hard at all  Food Insecurity: No Food Insecurity (02/02/2024)   Hunger Vital Sign    Worried About Running Out of Food in the Last Year: Never true    Ran Out of Food in the Last Year: Never true  Transportation Needs: No Transportation Needs (02/02/2024)   PRAPARE - Administrator, Civil Service (Medical): No    Lack of Transportation (Non-Medical): No  Physical Activity: Sufficiently Active (02/02/2024)   Exercise Vital Sign    Days of Exercise per Week: 4 days    Minutes of Exercise per Session: 80 min  Stress: No Stress Concern Present (02/02/2024)   [employer] of Occupational Health - Occupational Stress Questionnaire    Feeling of Stress : Not at all  Social Connections: Socially Integrated (02/02/2024)   Social Connection and Isolation Panel [NHANES]    Frequency of Communication with Friends and Family: More than three times a week    Frequency of Social Gatherings with Friends and Family: Twice a week    Attends Religious Services: More than 4 times per year    Active Member of [employer] or Organizations: Yes    Attends Banker Meetings: 1 to 4 times per year    Marital Status: Married  Catering manager Violence: Not At Risk (02/03/2024)   Humiliation, Afraid, Rape, and Kick questionnaire    Fear of Current or Ex-Partner: No    Emotionally Abused: No    Physically Abused: No    Sexually Abused: No    Family History  Problem Relation Age of Onset   CAD Father    Stroke Father    CAD Brother    Aortic stenosis Brother     ROS: no fevers or chills, productive cough, hemoptysis, dysphasia, odynophagia, melena, hematochezia, dysuria, hematuria, rash, seizure activity, orthopnea, PND, pedal edema, claudication. Remaining systems are negative.  Physical Exam: Well-developed well-nourished in no acute distress.  Skin is warm and dry.  HEENT is normal.  Neck is supple.  Chest is clear to auscultation  with normal expansion.  Cardiovascular exam is regular rate and rhythm.  Abdominal exam nontender or distended. No masses palpated. Extremities show no edema. neuro grossly intact  EKG Interpretation Date/Time:  Wednesday Mar 01 2024 10:07:07 EDT Ventricular Rate:  76 PR Interval:  164 QRS Duration:  72 QT Interval:  368 QTC Calculation: 414 R Axis:   82  Text Interpretation: Normal sinus rhythm Normal ECG Confirmed by Alexandria Angel ([id number]) on 03/01/2024 10:20:09 AM    A/P  1 coronary artery disease status post coronary bypass graft-patient denies chest pain.  Continue aspirin  and statin.  2 hyperlipidemia-continue Lipitor  and Zetia .  Most recent LDL 46.  3 hypertension-patient's blood pressure is  controlled.  Continue present medical regimen.  4 obstructive sleep apnea-managed by Dr. Loetta Ringer.  Alexandria Angel, MD

## 2024-02-21 NOTE — Telephone Encounter (Signed)
Spoke with pt, Refill sent to the pharmacy electronically.  

## 2024-03-01 ENCOUNTER — Encounter: Payer: Self-pay | Admitting: Cardiology

## 2024-03-01 ENCOUNTER — Ambulatory Visit: Payer: 59 | Attending: Cardiology | Admitting: Cardiology

## 2024-03-01 VITALS — BP 132/80 | HR 76 | Ht 69.5 in | Wt 221.0 lb

## 2024-03-01 DIAGNOSIS — E7800 Pure hypercholesterolemia, unspecified: Secondary | ICD-10-CM

## 2024-03-01 DIAGNOSIS — I10 Essential (primary) hypertension: Secondary | ICD-10-CM | POA: Diagnosis not present

## 2024-03-01 DIAGNOSIS — I2510 Atherosclerotic heart disease of native coronary artery without angina pectoris: Secondary | ICD-10-CM | POA: Diagnosis not present

## 2024-03-01 NOTE — Patient Instructions (Signed)

## 2024-03-24 ENCOUNTER — Encounter: Payer: Self-pay | Admitting: Internal Medicine

## 2024-08-08 ENCOUNTER — Other Ambulatory Visit

## 2024-08-08 DIAGNOSIS — R7302 Impaired glucose tolerance (oral): Secondary | ICD-10-CM

## 2024-08-08 DIAGNOSIS — E7800 Pure hypercholesterolemia, unspecified: Secondary | ICD-10-CM

## 2024-08-08 NOTE — Progress Notes (Addendum)
 Patient Care Team: Perri Ronal PARAS, MD as PCP - General (Internal Medicine) Pietro Redell RAMAN, MD as PCP - Cardiology (Cardiology)  Visit Date: 08/10/24  Subjective:    Patient ID: Zachary [name] , Male   DOB: 27-Apr-1961, 63 y.o.    MRN: [id number]   63 y.o. Male presents today for 6 month follow up for Hyperlipidemia, impaired glucose tolerance. Patient has a past medical history of Hypertension, NSTEMI; Acute Coronary Syndrome; S/p CABG x3; Grade 1 Diastolic Dysfunction.  History of Hypertension treated with Lisinopril  5 mg daily and Metoprolol  tartrate 12.5 mg twice daily.   History of Hyperlipidemia treated with Atorvastatin  80 mg daily and Zetia  10 mg daily. 08/08/2024 Lipid Panel normal.    History of Impaired Glucose Tolerance treated with Metformin  500 mg twice daily. 08/08/2024 HgbA1c 6.2%. He has expressed interest in knowing what more he can do to lower his HgbA1c further.   Labs 08/08/2024 Hemoglobin 6.2%, Otherwise WNL.   Vaccine counseling: Influenza vaccine received today. UTD on Shingles vaccine. Pneumonia and RSV vaccines due. Covid-19 vaccine declined.   Past Medical History:  Diagnosis Date   Hyperlipemia      Family History  Problem Relation Age of Onset   CAD Father    Stroke Father    CAD Brother    Aortic stenosis Brother      Review of Systems  All other systems reviewed and are negative.       Objective:   Vitals: Ht 5' 9.5 (1.765 m)   BMI 32.17 kg/m    Physical Exam Constitutional:      General: He is not in acute distress.    Appearance: Normal appearance. He is not ill-appearing.  HENT:     Head: Normocephalic and atraumatic.  Cardiovascular:     Rate and Rhythm: Normal rate and regular rhythm.     Pulses: Normal pulses.     Heart sounds: Normal heart sounds. No murmur heard.    No friction rub. No gallop.  Pulmonary:     Effort: Pulmonary effort is normal. No respiratory distress.     Breath sounds: Normal breath sounds. No  wheezing or rales.  Skin:    General: Skin is warm and dry.  Neurological:     Mental Status: He is alert and oriented to person, place, and time. Mental status is at baseline.  Psychiatric:        Mood and Affect: Mood normal.        Behavior: Behavior normal.        Thought Content: Thought content normal.        Judgment: Judgment normal.       Results:     Labs:       Component Value Date/Time   NA 131 (L) 02/03/2024 0951   NA 139 11/15/2019 0841   K 5.2 02/03/2024 0951   CL 98 02/03/2024 0951   CO2 25 02/03/2024 0951   GLUCOSE 99 02/03/2024 0951   BUN 19 02/03/2024 0951   BUN 16 11/15/2019 0841   CREATININE 1.06 02/03/2024 0951   CALCIUM  9.3 02/03/2024 0951   PROT 6.5 08/08/2024 1214   PROT 6.5 12/16/2022 0829   ALBUMIN  4.4 12/16/2022 0829   AST 19 08/08/2024 1214   ALT 16 08/08/2024 1214   ALKPHOS 66 12/16/2022 0829   BILITOT 0.7 08/08/2024 1214   BILITOT 0.6 12/16/2022 0829   GFRNONAA 80 06/18/2020 0931   GFRAA 93 06/18/2020 0931     Lab Results  Component Value Date   WBC 7.5 07/29/2023   HGB 13.9 07/29/2023   HCT 42.7 07/29/2023   MCV 93.6 07/29/2023   PLT 354 07/29/2023    Lab Results  Component Value Date   CHOL 98 08/08/2024   HDL 42 08/08/2024   LDLCALC 38 08/08/2024   TRIG 96 08/08/2024   CHOLHDL 2.3 08/08/2024    Lab Results  Component Value Date   HGBA1C 6.2 (H) 08/08/2024     Lab Results  Component Value Date   TSH 1.80 07/07/2021     Lab Results  Component Value Date   PSA 3.44 07/29/2023   PSA 3.08 07/13/2022   PSA 3.06 07/07/2021     Assessment & Plan:   Hypertension: treated with Lisinopril  5 mg daily and Metoprolol  tartrate 12.5 mg twice daily.   Hyperlipidemia: treated with Atorvastatin  80 mg daily and Zetia  10 mg daily. 08/08/2024 Lipid Panel normal.    Impaired Glucose Tolerance: treated with Metformin  500 mg twice daily. 08/08/2024 HgbA1c 6.2%.  He has expressed interest in knowing what more he can do to  lower his HgbA1c further.    Referred to [facility].   Vaccine counseling: Influenza vaccine received today. UTD on Shingles vaccine. Pneumonia and RSV vaccines due. Covid-19 vaccine declined.   Health maintenance referral for colon cancer screening   I,Makayla C Reid,acting as a scribe for Ronal JINNY Hailstone, MD.,have documented all relevant documentation on the behalf of Ronal JINNY Hailstone, MD,as directed by  Ronal JINNY Hailstone, MD while in the presence of Ronal JINNY Hailstone, MD.   I, Ronal JINNY Hailstone, MD, have reviewed all documentation for this visit. The documentation on 08/10/2024 for the exam, diagnosis, procedures, and orders are all accurate and complete.

## 2024-08-09 LAB — HEPATIC FUNCTION PANEL
AG Ratio: 2.1 (calc) (ref 1.0–2.5)
ALT: 16 U/L (ref 9–46)
AST: 19 U/L (ref 10–35)
Albumin: 4.4 g/dL (ref 3.6–5.1)
Alkaline phosphatase (APISO): 52 U/L (ref 35–144)
Bilirubin, Direct: 0.2 mg/dL (ref 0.0–0.2)
Globulin: 2.1 g/dL (ref 1.9–3.7)
Indirect Bilirubin: 0.5 mg/dL (ref 0.2–1.2)
Total Bilirubin: 0.7 mg/dL (ref 0.2–1.2)
Total Protein: 6.5 g/dL (ref 6.1–8.1)

## 2024-08-09 LAB — LIPID PANEL
Cholesterol: 98 mg/dL (ref <200)
HDL: 42 mg/dL (ref >=40)
LDL Cholesterol (Calc): 38 mg/dL
Non-HDL Cholesterol (Calc): 56 mg/dL (ref <130)
Total CHOL/HDL Ratio: 2.3 (calc) (ref <5.0)
Triglycerides: 96 mg/dL (ref <150)

## 2024-08-09 LAB — HEMOGLOBIN A1C
Hgb A1c MFr Bld: 6.2 % — ABNORMAL HIGH (ref <5.7)
Mean Plasma Glucose: 131 mg/dL
eAG (mmol/L): 7.3 mmol/L

## 2024-08-10 ENCOUNTER — Encounter: Payer: Self-pay | Admitting: Internal Medicine

## 2024-08-10 ENCOUNTER — Ambulatory Visit: Admitting: Internal Medicine

## 2024-08-10 VITALS — BP 120/80 | HR 68 | Ht 69.5 in | Wt 223.0 lb

## 2024-08-10 DIAGNOSIS — E7800 Pure hypercholesterolemia, unspecified: Secondary | ICD-10-CM | POA: Diagnosis not present

## 2024-08-10 DIAGNOSIS — E1165 Type 2 diabetes mellitus with hyperglycemia: Secondary | ICD-10-CM

## 2024-08-10 DIAGNOSIS — R7302 Impaired glucose tolerance (oral): Secondary | ICD-10-CM

## 2024-08-10 DIAGNOSIS — Z23 Encounter for immunization: Secondary | ICD-10-CM | POA: Diagnosis not present

## 2024-08-10 DIAGNOSIS — Z6832 Body mass index (BMI) 32.0-32.9, adult: Secondary | ICD-10-CM

## 2024-08-10 DIAGNOSIS — Z1211 Encounter for screening for malignant neoplasm of colon: Secondary | ICD-10-CM

## 2024-08-10 DIAGNOSIS — Z951 Presence of aortocoronary bypass graft: Secondary | ICD-10-CM

## 2024-08-10 DIAGNOSIS — I252 Old myocardial infarction: Secondary | ICD-10-CM

## 2024-08-15 NOTE — Patient Instructions (Addendum)
 Patient is being referred to Endocrinology for consideration of GLP-1 medication for glucose intolerance with hx of heart disease. He is interested in lowering glucose further and losing some weight. Medicare wellness and  CPE  is due October. Flu vaccine given. Patient should have pneumococcal vaccine in the near future. He is also past due for colonoscopy. Referral will be made. Consider Shingrix vaccines in the Spring.

## 2024-08-25 ENCOUNTER — Other Ambulatory Visit: Payer: Self-pay | Admitting: Internal Medicine

## 2024-11-27 ENCOUNTER — Ambulatory Visit: Admitting: Endocrinology

## 2025-01-03 ENCOUNTER — Ambulatory Visit: Admitting: "Endocrinology

## 2025-02-13 ENCOUNTER — Other Ambulatory Visit: Payer: Self-pay

## 2025-02-15 ENCOUNTER — Ambulatory Visit: Payer: Self-pay | Admitting: Internal Medicine
